# Patient Record
Sex: Female | Born: 1950 | Race: Black or African American | Hispanic: No | Marital: Married | Smoking: Former smoker
Health system: Southern US, Community
[De-identification: ages and names within clinical notes are randomized; demographics above are authoritative.]

## PROBLEM LIST (undated history)

## (undated) DIAGNOSIS — M8580 Other specified disorders of bone density and structure, unspecified site: Secondary | ICD-10-CM

## (undated) DIAGNOSIS — T4145XA Adverse effect of unspecified anesthetic, initial encounter: Secondary | ICD-10-CM

## (undated) DIAGNOSIS — Z8709 Personal history of other diseases of the respiratory system: Secondary | ICD-10-CM

## (undated) DIAGNOSIS — J189 Pneumonia, unspecified organism: Secondary | ICD-10-CM

## (undated) DIAGNOSIS — R351 Nocturia: Secondary | ICD-10-CM

## (undated) DIAGNOSIS — C801 Malignant (primary) neoplasm, unspecified: Secondary | ICD-10-CM

## (undated) DIAGNOSIS — M549 Dorsalgia, unspecified: Secondary | ICD-10-CM

## (undated) DIAGNOSIS — Z9289 Personal history of other medical treatment: Secondary | ICD-10-CM

## (undated) DIAGNOSIS — I10 Essential (primary) hypertension: Secondary | ICD-10-CM

## (undated) DIAGNOSIS — R350 Frequency of micturition: Secondary | ICD-10-CM

## (undated) DIAGNOSIS — G4730 Sleep apnea, unspecified: Secondary | ICD-10-CM

## (undated) DIAGNOSIS — G8929 Other chronic pain: Secondary | ICD-10-CM

## (undated) DIAGNOSIS — E559 Vitamin D deficiency, unspecified: Secondary | ICD-10-CM

## (undated) DIAGNOSIS — M1990 Unspecified osteoarthritis, unspecified site: Secondary | ICD-10-CM

## (undated) DIAGNOSIS — C50919 Malignant neoplasm of unspecified site of unspecified female breast: Secondary | ICD-10-CM

## (undated) DIAGNOSIS — K219 Gastro-esophageal reflux disease without esophagitis: Secondary | ICD-10-CM

## (undated) DIAGNOSIS — R3915 Urgency of urination: Secondary | ICD-10-CM

## (undated) DIAGNOSIS — M2550 Pain in unspecified joint: Secondary | ICD-10-CM

## (undated) DIAGNOSIS — K5900 Constipation, unspecified: Secondary | ICD-10-CM

## (undated) DIAGNOSIS — M2540 Effusion, unspecified joint: Secondary | ICD-10-CM

## (undated) DIAGNOSIS — G629 Polyneuropathy, unspecified: Secondary | ICD-10-CM

## (undated) DIAGNOSIS — Z8719 Personal history of other diseases of the digestive system: Secondary | ICD-10-CM

## (undated) DIAGNOSIS — Z8619 Personal history of other infectious and parasitic diseases: Secondary | ICD-10-CM

## (undated) DIAGNOSIS — T8859XA Other complications of anesthesia, initial encounter: Secondary | ICD-10-CM

## (undated) DIAGNOSIS — K5790 Diverticulosis of intestine, part unspecified, without perforation or abscess without bleeding: Secondary | ICD-10-CM

## (undated) HISTORY — DX: Other chronic pain: G89.29

## (undated) HISTORY — DX: Sleep apnea, unspecified: G47.30

## (undated) HISTORY — DX: Essential (primary) hypertension: I10

## (undated) HISTORY — DX: Malignant (primary) neoplasm, unspecified: C80.1

## (undated) HISTORY — PX: BACK SURGERY: SHX140

## (undated) HISTORY — DX: Other specified disorders of bone density and structure, unspecified site: M85.80

## (undated) HISTORY — DX: Polyneuropathy, unspecified: G62.9

## (undated) HISTORY — PX: BREAST SURGERY: SHX581

## (undated) HISTORY — PX: OTHER SURGICAL HISTORY: SHX169

## (undated) HISTORY — PX: DILATION AND CURETTAGE OF UTERUS: SHX78

## (undated) HISTORY — PX: ABDOMINAL HYSTERECTOMY: SHX81

## (undated) HISTORY — DX: Dorsalgia, unspecified: M54.9

## (undated) HISTORY — PX: ESOPHAGOGASTRODUODENOSCOPY: SHX1529

## (undated) HISTORY — PX: COLONOSCOPY: SHX174

## (undated) HISTORY — DX: Malignant neoplasm of unspecified site of unspecified female breast: C50.919

---

## 1997-06-02 ENCOUNTER — Other Ambulatory Visit: Admission: RE | Admit: 1997-06-02 | Discharge: 1997-06-02 | Payer: Self-pay | Admitting: Obstetrics and Gynecology

## 1997-07-14 ENCOUNTER — Inpatient Hospital Stay (HOSPITAL_COMMUNITY): Admission: RE | Admit: 1997-07-14 | Discharge: 1997-07-18 | Payer: Self-pay | Admitting: Obstetrics and Gynecology

## 1997-07-30 ENCOUNTER — Other Ambulatory Visit: Admission: RE | Admit: 1997-07-30 | Discharge: 1997-07-30 | Payer: Self-pay | Admitting: Obstetrics and Gynecology

## 1997-10-29 ENCOUNTER — Emergency Department (HOSPITAL_COMMUNITY): Admission: EM | Admit: 1997-10-29 | Discharge: 1997-10-29 | Payer: Self-pay | Admitting: Emergency Medicine

## 1998-02-15 ENCOUNTER — Ambulatory Visit (HOSPITAL_COMMUNITY): Admission: RE | Admit: 1998-02-15 | Discharge: 1998-02-15 | Payer: Self-pay | Admitting: Internal Medicine

## 1998-11-19 ENCOUNTER — Ambulatory Visit (HOSPITAL_COMMUNITY): Admission: RE | Admit: 1998-11-19 | Discharge: 1998-11-19 | Payer: Self-pay | Admitting: Internal Medicine

## 1999-01-06 ENCOUNTER — Other Ambulatory Visit: Admission: RE | Admit: 1999-01-06 | Discharge: 1999-01-06 | Payer: Self-pay | Admitting: Obstetrics and Gynecology

## 1999-02-15 ENCOUNTER — Encounter: Payer: Self-pay | Admitting: Internal Medicine

## 1999-02-15 ENCOUNTER — Encounter: Admission: RE | Admit: 1999-02-15 | Discharge: 1999-02-15 | Payer: Self-pay | Admitting: Internal Medicine

## 1999-04-05 ENCOUNTER — Encounter: Admission: RE | Admit: 1999-04-05 | Discharge: 1999-04-05 | Payer: Self-pay | Admitting: Internal Medicine

## 1999-04-05 ENCOUNTER — Encounter: Payer: Self-pay | Admitting: Internal Medicine

## 1999-07-14 ENCOUNTER — Ambulatory Visit (HOSPITAL_COMMUNITY): Admission: RE | Admit: 1999-07-14 | Discharge: 1999-07-14 | Payer: Self-pay | Admitting: Gastroenterology

## 1999-09-11 ENCOUNTER — Inpatient Hospital Stay (HOSPITAL_COMMUNITY): Admission: EM | Admit: 1999-09-11 | Discharge: 1999-09-13 | Payer: Self-pay | Admitting: Emergency Medicine

## 1999-09-11 ENCOUNTER — Encounter: Payer: Self-pay | Admitting: Internal Medicine

## 2000-07-02 ENCOUNTER — Other Ambulatory Visit: Admission: RE | Admit: 2000-07-02 | Discharge: 2000-07-02 | Payer: Self-pay | Admitting: Obstetrics and Gynecology

## 2000-08-20 ENCOUNTER — Ambulatory Visit (HOSPITAL_COMMUNITY): Admission: RE | Admit: 2000-08-20 | Discharge: 2000-08-20 | Payer: Self-pay | Admitting: Internal Medicine

## 2000-08-24 ENCOUNTER — Encounter: Payer: Self-pay | Admitting: Internal Medicine

## 2000-08-24 ENCOUNTER — Encounter: Admission: RE | Admit: 2000-08-24 | Discharge: 2000-08-24 | Payer: Self-pay | Admitting: Internal Medicine

## 2000-09-26 ENCOUNTER — Encounter: Payer: Self-pay | Admitting: Emergency Medicine

## 2000-09-26 ENCOUNTER — Emergency Department (HOSPITAL_COMMUNITY): Admission: EM | Admit: 2000-09-26 | Discharge: 2000-09-26 | Payer: Self-pay | Admitting: Emergency Medicine

## 2001-04-11 ENCOUNTER — Emergency Department (HOSPITAL_COMMUNITY): Admission: EM | Admit: 2001-04-11 | Discharge: 2001-04-11 | Payer: Self-pay

## 2003-10-08 ENCOUNTER — Inpatient Hospital Stay (HOSPITAL_COMMUNITY): Admission: EM | Admit: 2003-10-08 | Discharge: 2003-10-11 | Payer: Self-pay | Admitting: Emergency Medicine

## 2003-10-09 ENCOUNTER — Encounter: Payer: Self-pay | Admitting: Cardiology

## 2003-11-10 ENCOUNTER — Ambulatory Visit (HOSPITAL_BASED_OUTPATIENT_CLINIC_OR_DEPARTMENT_OTHER): Admission: RE | Admit: 2003-11-10 | Discharge: 2003-11-10 | Payer: Self-pay | Admitting: Internal Medicine

## 2003-11-23 ENCOUNTER — Ambulatory Visit (HOSPITAL_COMMUNITY): Admission: RE | Admit: 2003-11-23 | Discharge: 2003-11-23 | Payer: Self-pay | Admitting: Internal Medicine

## 2004-02-05 ENCOUNTER — Encounter: Admission: RE | Admit: 2004-02-05 | Discharge: 2004-02-05 | Payer: Self-pay | Admitting: Internal Medicine

## 2004-02-08 ENCOUNTER — Encounter: Admission: RE | Admit: 2004-02-08 | Discharge: 2004-02-08 | Payer: Self-pay | Admitting: Internal Medicine

## 2004-04-04 ENCOUNTER — Ambulatory Visit (HOSPITAL_COMMUNITY): Admission: RE | Admit: 2004-04-04 | Discharge: 2004-04-04 | Payer: Self-pay | Admitting: Gastroenterology

## 2004-07-27 ENCOUNTER — Inpatient Hospital Stay (HOSPITAL_COMMUNITY): Admission: RE | Admit: 2004-07-27 | Discharge: 2004-07-30 | Payer: Self-pay | Admitting: Neurosurgery

## 2004-10-25 ENCOUNTER — Ambulatory Visit (HOSPITAL_COMMUNITY): Admission: RE | Admit: 2004-10-25 | Discharge: 2004-10-25 | Payer: Self-pay | Admitting: Neurosurgery

## 2004-10-27 ENCOUNTER — Encounter: Admission: RE | Admit: 2004-10-27 | Discharge: 2004-10-27 | Payer: Self-pay | Admitting: Internal Medicine

## 2004-11-02 ENCOUNTER — Encounter: Admission: RE | Admit: 2004-11-02 | Discharge: 2004-11-02 | Payer: Self-pay | Admitting: Internal Medicine

## 2005-01-23 ENCOUNTER — Other Ambulatory Visit: Admission: RE | Admit: 2005-01-23 | Discharge: 2005-01-23 | Payer: Self-pay | Admitting: Gynecology

## 2005-02-13 ENCOUNTER — Emergency Department (HOSPITAL_COMMUNITY): Admission: EM | Admit: 2005-02-13 | Discharge: 2005-02-14 | Payer: Self-pay | Admitting: Emergency Medicine

## 2005-08-30 ENCOUNTER — Encounter: Payer: Self-pay | Admitting: Internal Medicine

## 2005-12-31 ENCOUNTER — Emergency Department (HOSPITAL_COMMUNITY): Admission: EM | Admit: 2005-12-31 | Discharge: 2005-12-31 | Payer: Self-pay

## 2006-04-19 ENCOUNTER — Other Ambulatory Visit: Admission: RE | Admit: 2006-04-19 | Discharge: 2006-04-19 | Payer: Self-pay | Admitting: Gynecology

## 2006-05-28 ENCOUNTER — Encounter: Admission: RE | Admit: 2006-05-28 | Discharge: 2006-05-28 | Payer: Self-pay | Admitting: Internal Medicine

## 2006-06-04 ENCOUNTER — Encounter: Admission: RE | Admit: 2006-06-04 | Discharge: 2006-06-04 | Payer: Self-pay | Admitting: Internal Medicine

## 2006-12-24 ENCOUNTER — Encounter: Admission: RE | Admit: 2006-12-24 | Discharge: 2006-12-24 | Payer: Self-pay | Admitting: General Surgery

## 2006-12-27 ENCOUNTER — Encounter (HOSPITAL_BASED_OUTPATIENT_CLINIC_OR_DEPARTMENT_OTHER): Payer: Self-pay | Admitting: General Surgery

## 2006-12-27 ENCOUNTER — Ambulatory Visit (HOSPITAL_BASED_OUTPATIENT_CLINIC_OR_DEPARTMENT_OTHER): Admission: RE | Admit: 2006-12-27 | Discharge: 2006-12-27 | Payer: Self-pay | Admitting: General Surgery

## 2006-12-27 HISTORY — PX: OTHER SURGICAL HISTORY: SHX169

## 2006-12-28 ENCOUNTER — Emergency Department (HOSPITAL_COMMUNITY): Admission: EM | Admit: 2006-12-28 | Discharge: 2006-12-28 | Payer: Self-pay | Admitting: Emergency Medicine

## 2007-01-29 ENCOUNTER — Ambulatory Visit (HOSPITAL_BASED_OUTPATIENT_CLINIC_OR_DEPARTMENT_OTHER): Admission: RE | Admit: 2007-01-29 | Discharge: 2007-01-30 | Payer: Self-pay | Admitting: General Surgery

## 2007-01-29 ENCOUNTER — Encounter (HOSPITAL_BASED_OUTPATIENT_CLINIC_OR_DEPARTMENT_OTHER): Payer: Self-pay | Admitting: General Surgery

## 2007-03-23 ENCOUNTER — Emergency Department (HOSPITAL_COMMUNITY): Admission: EM | Admit: 2007-03-23 | Discharge: 2007-03-23 | Payer: Self-pay | Admitting: Emergency Medicine

## 2007-04-04 ENCOUNTER — Ambulatory Visit: Payer: Self-pay | Admitting: Oncology

## 2007-04-09 ENCOUNTER — Ambulatory Visit: Admission: RE | Admit: 2007-04-09 | Discharge: 2007-05-01 | Payer: Self-pay | Admitting: Radiation Oncology

## 2007-05-02 ENCOUNTER — Ambulatory Visit: Admission: RE | Admit: 2007-05-02 | Discharge: 2007-07-05 | Payer: Self-pay | Admitting: Radiation Oncology

## 2007-05-02 DIAGNOSIS — C801 Malignant (primary) neoplasm, unspecified: Secondary | ICD-10-CM

## 2007-05-02 HISTORY — DX: Malignant (primary) neoplasm, unspecified: C80.1

## 2007-05-06 LAB — COMPREHENSIVE METABOLIC PANEL
ALT: 16 U/L (ref 0–35)
AST: 14 U/L (ref 0–37)
Albumin: 4.4 g/dL (ref 3.5–5.2)
Alkaline Phosphatase: 80 U/L (ref 39–117)
BUN: 7 mg/dL (ref 6–23)
Creatinine, Ser: 0.89 mg/dL (ref 0.40–1.20)
Potassium: 3.4 mEq/L — ABNORMAL LOW (ref 3.5–5.3)

## 2007-05-06 LAB — CBC WITH DIFFERENTIAL/PLATELET
BASO%: 0.4 % (ref 0.0–2.0)
Basophils Absolute: 0 10*3/uL (ref 0.0–0.1)
EOS%: 4 % (ref 0.0–7.0)
HGB: 13.8 g/dL (ref 11.6–15.9)
MCH: 28.1 pg (ref 26.0–34.0)
MCHC: 34 g/dL (ref 32.0–36.0)
MCV: 82.8 fL (ref 81.0–101.0)
MONO%: 5.3 % (ref 0.0–13.0)
RDW: 12.7 % (ref 11.3–14.5)

## 2007-05-29 ENCOUNTER — Other Ambulatory Visit: Admission: RE | Admit: 2007-05-29 | Discharge: 2007-05-29 | Payer: Self-pay | Admitting: Gynecology

## 2007-06-17 ENCOUNTER — Ambulatory Visit (HOSPITAL_COMMUNITY): Admission: RE | Admit: 2007-06-17 | Discharge: 2007-06-17 | Payer: Self-pay | Admitting: Gastroenterology

## 2008-09-07 ENCOUNTER — Ambulatory Visit: Payer: Self-pay | Admitting: Gynecology

## 2008-09-07 ENCOUNTER — Other Ambulatory Visit: Admission: RE | Admit: 2008-09-07 | Discharge: 2008-09-07 | Payer: Self-pay | Admitting: Gynecology

## 2008-09-07 ENCOUNTER — Encounter: Payer: Self-pay | Admitting: Gynecology

## 2008-09-07 ENCOUNTER — Encounter: Admission: RE | Admit: 2008-09-07 | Discharge: 2008-09-07 | Payer: Self-pay | Admitting: General Surgery

## 2008-09-17 ENCOUNTER — Ambulatory Visit: Payer: Self-pay | Admitting: Gynecology

## 2008-09-29 ENCOUNTER — Ambulatory Visit: Payer: Self-pay | Admitting: Oncology

## 2009-09-22 ENCOUNTER — Ambulatory Visit: Payer: Self-pay | Admitting: Gynecology

## 2009-09-22 ENCOUNTER — Other Ambulatory Visit: Admission: RE | Admit: 2009-09-22 | Discharge: 2009-09-22 | Payer: Self-pay | Admitting: Gynecology

## 2009-10-28 ENCOUNTER — Encounter: Admission: RE | Admit: 2009-10-28 | Discharge: 2009-10-28 | Payer: Self-pay | Admitting: Family Medicine

## 2009-11-25 ENCOUNTER — Ambulatory Visit: Payer: Self-pay | Admitting: Oncology

## 2009-12-23 ENCOUNTER — Ambulatory Visit: Payer: Self-pay | Admitting: Gynecology

## 2010-05-21 ENCOUNTER — Encounter: Payer: Self-pay | Admitting: Internal Medicine

## 2010-05-22 ENCOUNTER — Encounter: Payer: Self-pay | Admitting: Internal Medicine

## 2010-09-13 NOTE — Op Note (Signed)
Tara Summers, SIEGMANN               ACCOUNT NO.:  [id number]   MEDICAL RECORD NO.:  [id number]          PATIENT TYPE:  AMB   LOCATION:  DSC                          FACILITY:  [facility]   PHYSICIAN:  Leonie Man, M.D.   DATE OF BIRTH:  August 22, 1950   DATE OF PROCEDURE:  12/27/2006  DATE OF DISCHARGE:                               OPERATIVE REPORT   PREOPERATIVE DIAGNOSIS:  Right breast lesion, rule out carcinoma.   POSTOPERATIVE DIAGNOSIS:  Right breast lesion, rule out carcinoma,  pathology pending.   PROCEDURE:  Needle-localized excisional biopsy of right breast lesion.   SURGEON:  Leonie Man, M.D.   ASSISTANT:  OR nurse.   ANESTHESIA:  General.   The patient is a 60 year old female with a right-sided breast lesion  which on separate core biopsies has shown papillomas.  It most recently  showed an atypical papilloma read as BI-RADS 4.  She comes to the  operating room now for removal of this lesion after the risks and  potential benefits of surgery have been discussed, all questions  answered, and consent obtained.   PROCEDURE:  The patient is positioned supinely, and following the  induction of satisfactory anesthesia, the breast is prepped and draped  to be included in a sterile operative field.  Site localization, patient  identity, and the operation to be performed is substantiated and a right-  sided incision made approximately two fingerbreadths above the  inframammary fold.  It is deepened through the skin and subcutaneous  tissues, raising a superior flap and an inferior flap and dissecting  dura down following the wire into the region of the mass.  Large wide  margins around the mass were taken, and the entire mass was removed and  forwarded for radiologic diagnosis.  I spoke with Dr. Cassandria Anger from  [facility]  who says both clips and the lesion have been  successfully removed.  The lesion was then forwarded for pathologic  evaluation.  Hemostasis  was obtained within the substance of the breast.  The breast tissues were reapproximated using interrupted 2-0 Vicryl.  The skin was closed using a 5-0 Monocryl suture and reinforced with  Steri-Strips.  Sterile dressings were applied.   The anesthetic was reversed and the patient removed from the operating  room to the recovery room in stable condition.  She tolerated the  procedure well.      Leonie Man, M.D.  Electronically Signed     PB/MEDQ  D:  12/27/2006  T:  12/28/2006  Job:  [id number]

## 2010-09-13 NOTE — Op Note (Signed)
NAMESONDI, DESCH               ACCOUNT NO.:  [id number]   MEDICAL RECORD NO.:  [id number]          PATIENT TYPE:  AMB   LOCATION:  DSC                          FACILITY:  [facility]   PHYSICIAN:  Leonie Man, M.D.   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   PROCEDURES:  Re-excision right breast lumpectomy site for margins.   SURGEON:  Dr. Leonie Man.   ASSISTANT:  OR nurse.   ANESTHESIA:  General.   SPECIMENS TO LAB:  Breast tissue with a suture marking the superior  margin.   ESTIMATED BLOOD LOSS:  Minimal.   COMPLICATIONS:  None.  The patient returned to the PACU in excellent  condition.   Ms. Kelli Robeck is a 60 year old lady who underwent a wide localized  excision of a left breast ductal carcinoma in situ.  The estimated size  of the tumor at the time was 1.3 cm. Distance to the closest margin was  than 0.1 cm. The patient returns to the operating room now for re-  excision of the site after the risks and potential benefits of surgery  had been discussed, all questions answered and consent obtained.   DESCRIPTION OF PROCEDURE:  The patient is positioned supinely and  following the induction of satisfactory general anesthesia, the right  breast is identified, prepped and draped to be included in a sterile  operative field.  Patient identification and side of operation to be the  right side was then confirmed. I then made an elliptical incision around  the old scar cicatrix deepening this through the skin and subcutaneous  tissues and extending flaps medially, superiorly, laterally and  inferiorly so as to re-excise the entire cavity of the  previous excision  site.  This was accomplished at the area of the superior margin and this  was marked with a silk suture.  The entire breast excision site was then  removed and forwarded for pathologic evaluation.  Hemostasis was assured  with electrocautery.  Sponge and instrument counts verified. I elevated  the breast tissue from off of the pectoralis muscle so as to effect  tension-free closure of the breast tissue without leaving any  significant dead space within the breast.  This was done with 2-0 Vicryl  sutures.  The subcutaneous tissues were then closed with 3-0 Vicryl  sutures and the skin was closed  with a running 5-0 Monocryl suture and then reinforced with Steri-  Strips.  Sterile dressings were applied.  The anesthetic reversed and  the patient removed from the operating room to the recovery room in  stable condition.  She tolerated the procedure well.      Leonie Man, M.D.  Electronically Signed     PB/MEDQ  D:  01/29/2007  T:  01/29/2007  Job:  [id number]

## 2010-09-16 NOTE — Op Note (Signed)
NAMEKAZI, MONTORO               ACCOUNT NO.:  [id number]   MEDICAL RECORD NO.:  [id number]          PATIENT TYPE:  INP   LOCATION:  3013                         FACILITY:  [facility]   PHYSICIAN:  Coletta Memos, M.D.     DATE OF BIRTH:  06/03/1950   DATE OF PROCEDURE:  07/27/2004  DATE OF DISCHARGE:                                 OPERATIVE REPORT   PREOPERATIVE DIAGNOSES:  1.  L4 spondylolisthesis.  2.  L4-5 spondylolisthesis  3.  Grade I L4-5 degenerative disk disease.  4.  L4 lumbar radiculopathy.   POSTOPERATIVE DIAGNOSES:  1.  L4 spondylolisthesis.  2.  L4-5 spondylolisthesis  3.  Grade I L4-5 degenerative disk disease.  4.  L4 lumbar radiculopathy.   PROCEDURES:  1.  An L4 Gill procedure.  2.  Posterolateral arthrodesis L4-L5  3.  Posterior lumbar interbody fusion L4-5 with 11 mm Synthes PEEK cages      packed with morselized autograft.  4.  Pedicle screw fixation L4-L5.   SURGEON:  Coletta Memos, M.D.   ASSISTANT:  Hewitt Shorts, M.D.   COMPLICATIONS:  None.   ANESTHESIA:  General endotracheal.   INDICATIONS:  Tara Summers is a 60 year old who has been a long-term  patient of mine. She has had history of severe pain in the back and lower  extremities, left greater than right. She came in with an MRI that showed  spondylolisthesis of L4 and a spondylolisthesis of L4 and L5. She also had  severe compression of both nerve roots in the neural foramen. I recommended  and she agreed to undergo operative decompression and fusion of the L4-L5  segment.   DESCRIPTION OF PROCEDURE:  Mrs. Beauchamp was taken to the operating room,  intubated and placed under general anesthetic without difficulty. She was  rolled prone onto body rolls and all pressure points were properly padded.  Prior to that she had had her urethra catheterized and a Foley was placed  under sterile conditions. Her back was prepped and she was draped sterilely.  I infiltrated 20 mL 0.5% lidocaine  1:200,000 epinephrine into my proposed  incision in the lumbar region. I opened the skin with a #10 blade and took  this down to the thoracolumbar fascia. I then expose the lamina of L4-L5 and  of L3.  I  had an x-ray performed and it showed that I had a double ended  ganglion knife inferior to the lamina of L4. I was able to clearly identify  that as the spondylolisthesis.  It was extraordinarily loose and scar tissue  was present. I removed that segment and saved it for bone graft. Now having  unroofed the spinal canal, I was then able to decompress both L4 nerve roots  laterally. This was done with the use of Kerrison punches.   I then performed diskectomies bilaterally using the tools provided in the  Synthes PLIF set and Epstein curettes. After doing a gross total diskectomy,  I then used a rasp, curettes and the disk space shavers from the Synthes  PLIF set. After preparing the interspace for a 11  mm PEEK cage, I placed the  PEEK cages which had been packed with morselized autograft. This was done  bilaterally without difficulty.   I then turned my attention to the posterolateral arthrodesis and placed  morselized autograft laterally.   I then turned my attention to the pedicle screw fixation.. All screws were  placed with fluoroscopic guidance. Two screws in L4, two screws in L5. Each  screw hole was first probed with a blunt probe then tapped with a 5.5 mm  tap. I then placed the screws without difficulty, two in L5, two in L4. X-  ray showed screws to be in good position. I then placed 30 mm rods into the  screw heads and locked those into position. Morselized autograft had been  placed within the disk space prior to placement of the cages. The bone was  decorticated laterally. I then closed the wound in layered fashion with  Vicryl sutures with Dr. Earl Gala assistance. I reapproximated subcutaneous  tissue and subcuticular layer. Dermabond was used for sterile dressing. The   patient tolerated procedure well.      KC/MEDQ  D:  07/27/2004  T:  07/27/2004  Job:  [id number]

## 2010-09-16 NOTE — Discharge Summary (Signed)
[name], Tara Summers               ACCOUNT NO.:  [id number]   MEDICAL RECORD NO.:  [id number]          PATIENT TYPE:  INP   LOCATION:  3013                         FACILITY:  [facility]   PHYSICIAN:  Coletta Memos, M.D.     DATE OF BIRTH:  June 11, 1950   DATE OF ADMISSION:  07/27/2004  DATE OF DISCHARGE:  07/30/2004                                 DISCHARGE SUMMARY   ADMITTING DIAGNOSES:  L4-L5 spondylolisthesis, L4 spondylolysis, lumbar  radiculopathy and degenerative disk disease.   POSTOPERATIVE DIAGNOSES:  L4-L5 spondylolisthesis, L4 spondylolysis, lumbar  radiculopathy and degenerative disk disease.   PROCEDURES:  L4 Gill, L4-L5 posterolateral arthrodesis, posterior lumbar  interbody fusion L4-L5 with 11 millimeter Synthes P cages, pedicle screw  fixation L4-L5.   COMPLICATIONS:  None.   DISCHARGE STATUS:  Alive and well.   DESTINATION:  Home.   MEDICATIONS:  1.  Percocet 1-2 p.o. p.r.n. q.6 hours pain, 7.5/325.  2.  Flexeril 10 mg p.o. t.i.d. p.r.n. spasms.   [name] is a 60 year old with a long history of severe pain in her back  and lower extremities.  She had severe compression of the L4 nerve roots in  the neuroforamen secondary to spondylolisthesis due to a pars defect at L4.  I recommended and she agreed to undergo operative decompression.  Postoperatively her hospital course was very uneventful.  She did quite  well, having undergone physical therapy.  She will be discharged home.  Wound is clean, dry and without signs of infection.  She is voiding without  difficulty.  Walking well.      KC/MEDQ  D:  07/29/2004  T:  07/30/2004  Job:  [id number]

## 2010-09-16 NOTE — H&P (Signed)
[place]. [facility]  Patient:    Tara Summers, Tara Summers                      MRN: [id number] Adm. Date:  [id number] Attending:  Gwenyth Bender                         History and Physical  CHIEF COMPLAINT:  Increasing chest pain with weakness.  HISTORY OF PRESENT ILLNESS:  The patient is a 60 year old married black female, who presented to the emergency room complaining of increasing left-sided chest pain with associated weakness.  She relates she has not been feeling well for the past week.  She has noted intermittent aching pain in the left side of the chest which was sharp at times as well.  She noted pain traveling down into her left arm and the left back region.  She has also noted progressive fatigue during this time.  There has been no shortness of breath or diaphoresis with these episodes.  The patient notes intermittent diastasis in her fingers.  Today, her symptoms became much more severe and she came to the emergency room for further evaluation.  There has been no nausea or vomiting.  Denies significant palpitations or lightheadedness otherwise.  The patient has a history of hypertension for which she has been on a calcium channel agent.  She has also had recurrent episodes of hypokalemia in the past.  Notably she denies having symptoms of hypokalemia at this time.  She denies significant diarrhea.  No diuretic use. No laxative use.  History is significant for longstanding hypertension with associated obesity. Recently underwent colonoscopy two months ago which demonstrated a mild diverticulosis.  Endoscopy was unremarkable.  REVIEW OF SYSTEMS:  As noted above.  She has had intermittent low-back pain as well secondary to degenerative disk disease of the lumbosacral spine.  The patient does not smoke nor drink.  PRESENT MEDICATIONS: 1. Norvasc 5 mg q.d. 2. K-Dur 20 mEq b.i.d. per patients report.  She notably occasionally    skips  medication.  PHYSICAL EXAMINATION:  GENERAL:  On physical examination she is a well-developed, weak-appearing female in no acute distress.  VITAL SIGNS:  Reveal a blood pressure of 146/78, pulse 88, respirations 20, temperature 97.4.  O2 saturation is 98% on room air.  HEENT:  Head normocephalic, atraumatic without bruits.  Extraocular muscles intact.  Fundi grade I.  No sinus tenderness.  Posterior pharynx is clear.  NECK:  Supple.  No enlarged thyroid.  No posterior cervical nodes.  LUNGS:  Clear with decreased breath sounds at bases.  CARDIOVASCULAR:  Examination shows normal S1, S2.  No S3, S4, murmurs, or rubs.  BREASTS:  Pendulous.  She has slight tenderness along the left pectoralis major muscles.  There is also tenderness along the biceps muscles bilaterally. There is tenderness in the lower extremities as well.  ABDOMEN:  Bowel sounds are present.  No epigastric tenderness or masses appreciated.  MUSCULOSKELETAL:  Examination is as noted above.  EXTREMITIES:  Notable for mild tenderness in the posterior calf and negative Homans.  Pulses are trace to 1+ bilaterally.  Strength is intact.  ECG demonstrates a normal sinus rhythm, normal axis with nonspecific ST-T wave changes.  LABORATORY DATA:  Remarkable for a potassium of 2.6, sodium 139, chloride 102, CO2 32.  BUN 16, creatinine 0.9, glucose 129.  She has an albumin of 3.8. SGOT, SGPT is normal.  Alkaline phosphatase  is 65.  CK notably 365 with 4.8% MB.  Relative index of 1.3.  Troponin I was less than 0.03.  Chest x-ray showed no active disease.  IMPRESSION: 1. Chest pain, presently musculoskeletal in origin.  Rule out myositis versus    rhabdomyolysis versus other. 2. Hypokalemia, symptomatic.  Questionable etiology at this time.  The patient    is not on any diuretics.  No history of gastrointestinal disease.  No    laxative use.  Rule out other. 3. Rule out atypical angina. 4. Hypertension. 5.  Obesity. 6. Degenerative disk disease, lumbosacral spine.  PLAN:  Will admit patient for a 23-hour observation.  Will correct her electrolytes with oral potassium as well as IV potassium.  Will obtain spot urine and potassium at this time.  Will need to document normal potassium.  We will at this time rule out underlying coronary artery disease as well.  Will obtain a serum Testerone level.  Further therapy pending results of the above. D:  09/11/99 TD:  09/12/99 Job: [id number]

## 2010-09-16 NOTE — Procedures (Signed)
[place]. [facility]  Patient:    Tara Summers, Tara Summers                      MRN: [id number] Proc. Date: 07/14/99 Adm. Date:  [id number] Attending:  Charna Elizabeth CC:         Lind Guest. August Saucer, M.D.                           Procedure Report  DATE OF BIRTH:  June 14, 1960  PROCEDURE PERFORMED:  Esophagogastroduodenoscopy.  ENDOSCOPIST:  Anselmo Rod, M.D.  REFERRING PHYSICIAN:  Eric L. August Saucer, M.D.  INSTRUMENTS USED:  Olympus video panendoscope.  INDICATION FOR PROCEDURE:  Epigastric pain with severe reflux and guaiac-positive stools in a 60 year old black female.  Rule out peptic ulcer disease, esophagitis, gastritis, etc.  PREPROCEDURE PREPARATION:  Informed consent was procured from the patient.  The  patient was fasted for eight hours prior to the procedure.  PREPROCEDURE PHYSICAL EXAMINATION:  VITAL SIGNS:  The patient had stable vital signs.  NECK:  Supple.  CHEST:  Clear to auscultation, S1, S2 regular.  ABDOMEN:  Soft with epigastric tenderness on palpation with guarding.  No rebound or wretching.  No hepatosplenomegaly.  No masses palpable.  DESCRIPTION OF THE PROCEDURE:  The patient was placed in the left lateral decubitus position, and sedated with Demerol and Versed.  Once the patient was adequately  sedated and maintained on low-flow oxygen, and continuous cardiac monitoring, the Olympus video panendoscope was advanced through the mouthpiece, over the tongue, into the esophagus under direct vision.  The entire esophagus appeared normal without evidence of ring, stricture, masses, lesions, or esophagitis and the gastric mucosa appeared healthy without evidence of hernia, ulcers, masses, or polyps.   The duodenal bulb and the small bowel distal to the bulb appeared normal. There was no obvious obstruction.  The patient tolerated the procedure well without complications.  IMPRESSION:  Normal  esophagogastroduodenoscopy.  RECOMMENDATIONS: 1. Weight loss within 30+ ______ have been emphasized. 2. The patient has strongly been advised to refrain from use of all    nonsteroidals. 3. Brochures of antireflux measures have been given to the patient for    education. 4. Outpatient follow up is advised in the next two weeks. 5. Proceed with colonoscopy at this time. DD:  07/14/99 TD:  07/15/99 Job: [id number]

## 2010-09-16 NOTE — Discharge Summary (Signed)
NAME:  Tara Summers, Tara Summers                         ACCOUNT NO.:  [id number]   MEDICAL RECORD NO.:  [id number]                   PATIENT TYPE:  INP   LOCATION:  0345                                 [facility]ACILITY:  [facility]   PHYSICIAN:  Mohan N. Sharyn Lull, M.D.              DATE OF BIRTH:  22-May-1950   DATE OF ADMISSION:  10/08/2003  DATE OF DISCHARGE:  10/11/2003                                 DISCHARGE SUMMARY   ADMITTING DIAGNOSES:  1. Chest pain, rule out myocardial infarction.  2. Uncontrolled hypertension.  3. Morbid obesity.  4. History of hiatus hernia.  5. Hypokalemia/headache, rule out secondary causes of hypertension (i.e.     primary hyperaldosteronism).  6. Positive family history of coronary artery disease.  7. Remote history of tobacco abuse.  8. Elevated blood sugars, rule out diabetes mellitus.   FINAL DIAGNOSES:  1. Status post chest pain, myocardial infarction ruled out.  Negative     Persantine Cardiolite.  2. Hypertension.  3. Morbid obesity.  4. New onset diabetes mellitus controlled by diet.  5. Hiatus hernia.  6. Persistent hypokalemia.  7. Negative CT of abdomen for abdominal tumors.  8. Positive family history of coronary artery disease.  9. Remote history of tobacco abuse.   DISCHARGE MEDICATIONS:  1. Aldactone 50 mg one tablet twice daily.  2. Toprol-XL 50 mg one tablet daily.  3. Altace 10 mg one capsule daily.  4. Baby aspirin 81 mg one tablet daily.   ACTIVITY:  As tolerated.   DIET:  Low salt, low cholesterol, 1800 calorie ADA diet.  The patient has  been advised to watch sweets and lose weight.   FOLLOW UP:  BNP in 2 weeks.  Follow up with Dr. Willey Blade in 2 weeks.   CONDITION ON DISCHARGE:  Stable.   BRIEF HISTORY AND HOSPITAL COURSE:  [name] is a 60 year old black female  with a past medical history significant for hypertension, hiatus hernia,  remote history of tobacco abuse, morbid obesity, history of plantar  fasciitis/heel spurs,  positive family history of coronary artery disease.  She came to the emergency room complaining of back and chest pain radiating  to the neck and left arm, squeezing in nature, associated with  lightheadedness, bilateral __________  .  The patient's chest pain initially  lasted __________  in the chest after lunch, and then around 2 p.m. while  dusting felt severe pain, so desired to come to the emergency room.  The  patient received aspirin, morphine, sulfate, and Vicodin in the ER with  relief of chest pain.  The patient denies any symptoms of chest pain in the  past.  Denies any orthopnea, leg swelling.  Denies palpations,  lightheadedness, and syncope.  The patient gives a history of exertional  dyspnea and occasional chest discomfort with exertion.  Presently denies any  chest pain.   PAST MEDICAL HISTORY:  As above.  PAST SURGICAL HISTORY:  1. She had right breast cyst resection many years ago.  2. Had a hysterectomy many years ago for fibroid tumors.   ALLERGIES:  No known drug allergies.   MEDICATIONS AT HOME:  1. She was on Norvasc 7.5 mg per patient, one tablet daily.  2. K-Dur 20 mEq daily.  The patient says she has been on K-Dur for many     years, and her potassium stays low all the time.  3. Lipitor 20 mg one tablet daily.   Denies any vomiting, diarrhea, or any laxative abuse.   SOCIAL HISTORY:  She is married and has 4 children.  Smoked a half pack per  day for 7 years; quit in 1979.  No history of alcohol abuse.  Does  housekeeping work.   FAMILY HISTORY:  Father died of emphysema at the age of 37.  Mother died of  MI at the age of 71.   PHYSICAL EXAMINATION:  GENERAL:  Alert and oriented x3.  In no acute  distress.  VITAL SIGNS:  Blood pressure was 189/98, pulse of 102, sinus tachycardia on  the monitor.  __________  .  NECK:  Supple.  No JVD, no bruit.  LUNGS:  She has decreased breath sounds at the bases.  CARDIOVASCULAR:  S1 and S2 were normal.  There  was no S3 gallop.  There was  a soft S4 gallop.  ABDOMEN:  Soft.  Bowel sounds are present.  Nontender.  EXTREMITIES:  There is no clubbing, cyanosis, or edema.  Bilateral pulses  were equal.  There was no evidence of DVT.   LABORATORY DATA:  Three sets of cardiac enzymes point of care read negative.  Her TSH was 2.63.  Aldosterone level is still pending.  __________  level is  still pending.  Her cholesterol is 215, LDL of 145.  Two sets of cardiac  enzymes were negative.  Hemoglobin A1C was 5.9.  Sodium was 141, potassium  2.8, chloride 103, bicarb 29.  Blood sugar was 123.  The peak fasting blood  sugar was 126.  Repeat electrolytes revealed a sodium of 141, potassium 3.4,  chloride 105, bicarb 28.  Magnesium was normal at 1.7.  Hemoglobin was 13.4,  hematocrit 38.7, white count of 7.7.  Renin level is pending.  CT of the  abdomen preliminary report revealed no evidence of adrenal adenoma.  EKG  showed normal sinus rhythm, LVH, left atrial enlargement, poor R wave  progression in V1 through V5.  Persantine Cardiolite was negative for  ischemia, with normal ejection fraction.   BRIEF HOSPITAL COURSE:  The patient was admitted to the telemetry unit.  Myocardial infarction was ruled out by serial enzymes and EKG.  The patient  subsequently underwent a Persantine Cardiolite on October 09, 2003, which  showed no evidence of ischemia.  The patient had persistent hypokalemia,  despite replacing adequate doses of K-Dur.  The patient denied any diuretic  abuse.  Denied any diarrhea or vomiting.  The patient subsequently underwent  a CT of the abdomen yesterday which showed no evidence of adrenal adenoma.  The patient has been ambulating in the hallway without any problems.  The  patient will be discharged home on the above medications, and will be  followed by Dr. Willey Blade in 2 weeks.  [name]. Sharyn Lull, M.D.   MNH/MEDQ  D:  10/11/2003  T:   10/11/2003  Job:  [id number]   cc:   Minerva Areola L. August Saucer, M.D.  [address] [address]  [place]  [place] [address]  Fax: [phone]

## 2010-09-16 NOTE — Discharge Summary (Signed)
. [facility]  Patient:    Tara, SOBOTTA                      MRN: [id number] Adm. Date:  [id number] Disc. Date: [id number] Attending:  Gwenyth Bender                           Discharge Summary  FINAL DIAGNOSES: 1. Chest pain, noncardiac. 2. Severe hypokalemia. 3. Hypertension. 4. Morbid obesity. 5. Osteoarthrosis. 6. Rhabdomyolysis secondary to hypokalemia.  OPERATIONS AND PROCEDURES:  None.  HISTORY OF PRESENT ILLNESS:  This was the first recent hospital admission for this 60 year old, married, black female who presented to the emergency room complaining of increasing left-sided chest pain with associated weakness. The patient relates not feeling well for the past week.  She had noted an intermittent aching pain on the left side of her chest which was sharp at times as well.  She noted the pain traveling down into her left leg and left pain recently.  The patient had noted increasing fatigue during this time as well.  There was no associated nausea or vomiting, or anginal type sensations. She denies significant palpitations or light-headedness as well.  The patient had been on a calcium channel agent.  She had, notably had, a past history of recurrent hypokalemia episodes in the past which was symptomatic.  At the time of admission, the patient was noted to have a potassium of 2.6.  She was admitted thereafter for further therapy.  PAST MEDICAL HISTORY:  Per admission H&P.  PHYSICAL EXAMINATION:  Per admission H&P.  HOSPITAL COURSE:  The patient was admitted for further treatment of chest pain which was most consistent with musculoskeletal in origin.  This was highly suggestive of a rhabdomyolysis associated with her severe hypokalemia. Her CPKs were elevated with negative MBs.  The patient was placed on telemetry in lieu of her significant hypokalemia.  She was given runs of 10 mEq IV potassium for a total of three times.  She was given  oral potassium replacement as well.  Her cardiac enzymes were evaluated as noted.  She was not found to have evidence for myocardial disease.  Notably, eventually her potassium  was corrected.  With this, her symptoms resolved completely.  A serum aldosterone level was obtained at the time of admission.  She was placed on spironolactone as well in lieu of her repeated bouts of hypokalemia.  The patient was made ambulatory thereafter which she tolerated.  By Sep 13, 1999, she was stable for discharge.  MEDICATIONS AT TIME OF DISCHARGE: 1. Spironolactone 25 mg p.o. b.i.d. 2. K-Dur 20 mEq q.i.d. 3. Norvasc 5 mg q.d. 4. Prevacid 30 mg p.o. q.h.s.  FOLLOW-UP:  The patient will be seen in the office in follow-up in three days time.  We need to repeat electrolyte study at that time as well. DD:  11/16/99 TD:  11/20/99 Job: [id number]

## 2010-09-16 NOTE — Procedures (Signed)
[place]  Patient:    Tara Summers, Tara Summers                      MRN: [id number] Proc. Date: 07/14/99 Adm. Date:  [id number] Disc. Date: [id number] Attending:  Charna Elizabeth CC:         Lind Guest. August Saucer, M.D.                           Procedure Report  DATE OF BIRTH:  December 01, 1950  PROCEDURE PERFORMED:  Colonoscopy.  ENDOSCOPIST:  Anselmo Rod, M.D.  INSTRUMENT USED:  Olympus video colonoscope.  INDICATION FOR PROCEDURE:  Guaiac-positive stools in a 60 year old black female. Rule out masses, polyps, erosions, ulcerations, diverticulosis, etc.  PREPROCEDURE PREPARATION:  Informed consent was procured from the patient.  The  patient was fasted for eight hours prior to procedure and prepped with a bottle of magnesium citrate and a gallon of NuLytely the night prior to the procedure.  PREPROCEDURE PHYSICAL:  VITAL SIGNS:  The patient had stable vital signs.  NECK:  Supple.  CHEST:  Clear to auscultation.  S1, S2 regular.  ABDOMEN:  Obese with epigastric tenderness on palpation with guarding.  No rebound, no rigiditym no hepatosplenomegaly.  No masses palpable.  DESCRIPTION OF THE PROCEDURE:  The patient was placed in the left lateral decubitus position and sedated with Demerol and Versed for the EGD.  No additional sedation was given for the colonoscopy.  Once the patient was adequately positioned, the  Olympus video colonoscope was advanced from the rectum to the cecum without difficulty.  There were right-sided diverticular lesions seen in the early stages of formation.  No masses, polyps, erosions, or ulcerations were present.  The patient tolerated the procedure well without complication.  IMPRESSION: 1. Few early ____-sided diverticular pockets. 2. Otherwise normal colonoscopy.  No masses or polyps seen.  RECOMMENDATIONS: 1. The patient has been advised to follow up in the office for repeat guaiac    testing.  Further  recommendations will be made at that time. 2. Information on diverticular disease and diverticulitis has been given to the    patient for her education.  A high-fiber diet has been encouraged. 3. Outpatient follow-up advised in the new two weeks. DD:  07/14/99 TD:  07/15/99 Job: [id number]

## 2010-09-16 NOTE — Procedures (Signed)
NAME:  Tara Summers, Tara Summers             ACCOUNT NO.:  [id number]   MEDICAL RECORD NO.:  [id number]          PATIENT TYPE:  OUT   LOCATION:  SLEEP CENTER                 FACILITY:  [facility]   PHYSICIAN:  Clinton D. Maple Hudson, M.D. DATE OF BIRTH:  01-Dec-1950   DATE OF ADMISSION:  11/10/2003  DATE OF DISCHARGE:  11/10/2003                              NOCTURNAL POLYSOMNOGRAM   REFERRING PHYSICIAN:  Dr. Willey Blade   INDICATION FOR STUDY AND HISTORY:  Hypersomnia with sleep apnea,  hypertension.   HOME MEDICATIONS:  None listed.  No medications taken before sleep.   Epworth sleepiness score 5/24, neck size 15.5 inches, BMI 47, weight 240  pounds.   SLEEP ARCHITECTURE:  301 minutes of sleep were recorded with a sleep  efficiency of 83%.  Total sleep time was 7% for stage I; 38% for stage II;  16% for stages III and IV; 39% REM.  Latency to sleep onset 28 minutes,  latency to REM 39 minutes.   RESPIRATORY DATA:  Mild obstructive sleep apnea/hypopnea with an RDI of 13.4  obstructive event per hour including 65 obstructive hypopnea and 2  obstructive apneas.  Events were not positional .  REM RDI was 8.8 per hour.  Split study protocol could not be utilized because of late sleep onset and  insufficient early events.   OXYGEN DATA:  Snoring was moderate with oxygen desaturation to 87% during  apneas.  Otherwise mean oxygen saturation through the study was 97% on room  air.   CARDIAC DATA:  Normal sinus rhythm with sinus bradycardia, heart rate  ranging 49 to 85 per minutes.   MOVEMENT AND PARASOMNIA:  There were occasional leg jerks with insignificant  effect on sleep.   IMPRESSION AND RECOMMENDATIONS:  Mild obstructive sleep apnea/hypopnea  syndrome (RDI 13 per hour) and mild oxygen desaturation.  Consider return  for CPAP titration if clinically appropriate.  It may help for patient to  bring a sleep medication on return.                                   ______________________________                              Rennis Chris. Maple Hudson, M.D.                                Diplomate, American Board of Sleep Medicine    CDY/MEDQ  D:  11/15/2003 16:39:30  T:  11/16/2003 14:55:06  Job:  [id number]

## 2010-09-23 ENCOUNTER — Ambulatory Visit (INDEPENDENT_AMBULATORY_CARE_PROVIDER_SITE_OTHER): Payer: BC Managed Care – PPO | Admitting: Women's Health

## 2010-09-23 DIAGNOSIS — R350 Frequency of micturition: Secondary | ICD-10-CM

## 2010-09-23 DIAGNOSIS — R809 Proteinuria, unspecified: Secondary | ICD-10-CM

## 2010-09-23 DIAGNOSIS — B373 Candidiasis of vulva and vagina: Secondary | ICD-10-CM

## 2010-09-23 DIAGNOSIS — N898 Other specified noninflammatory disorders of vagina: Secondary | ICD-10-CM

## 2010-10-17 ENCOUNTER — Ambulatory Visit: Payer: BC Managed Care – PPO | Admitting: Radiation Oncology

## 2010-12-08 ENCOUNTER — Other Ambulatory Visit: Payer: Self-pay | Admitting: Internal Medicine

## 2011-01-23 DIAGNOSIS — E119 Type 2 diabetes mellitus without complications: Secondary | ICD-10-CM | POA: Insufficient documentation

## 2011-01-23 DIAGNOSIS — M8580 Other specified disorders of bone density and structure, unspecified site: Secondary | ICD-10-CM | POA: Insufficient documentation

## 2011-01-23 DIAGNOSIS — I10 Essential (primary) hypertension: Secondary | ICD-10-CM | POA: Insufficient documentation

## 2011-01-27 ENCOUNTER — Encounter: Payer: BC Managed Care – PPO | Admitting: Gynecology

## 2011-01-30 ENCOUNTER — Ambulatory Visit: Payer: BC Managed Care – PPO | Attending: Radiation Oncology | Admitting: Radiation Oncology

## 2011-02-09 LAB — BASIC METABOLIC PANEL
BUN: 15
Chloride: 104
Glucose, Bld: 175 — ABNORMAL HIGH
Potassium: 3.5

## 2011-02-09 LAB — TROPONIN I: Troponin I: 0.02

## 2011-02-09 LAB — CK TOTAL AND CKMB (NOT AT ARMC)
CK, MB: 2.7
Relative Index: 2.1

## 2011-02-10 LAB — BASIC METABOLIC PANEL
Calcium: 9.1
GFR calc Af Amer: >60
GFR calc non Af Amer: >60
Sodium: 140

## 2011-02-10 LAB — DIFFERENTIAL
Basophils Absolute: 0
Lymphocytes Relative: 48 — ABNORMAL HIGH
Monocytes Absolute: 0.4
Monocytes Relative: 6
Neutro Abs: 2.9

## 2011-02-10 LAB — CBC
Hemoglobin: 13.3
RBC: 4.67

## 2011-03-27 ENCOUNTER — Encounter: Payer: Self-pay | Admitting: *Deleted

## 2011-04-02 ENCOUNTER — Emergency Department (HOSPITAL_COMMUNITY)
Admission: EM | Admit: 2011-04-02 | Discharge: 2011-04-02 | Disposition: A | Discharge status: Home / Self Care | Payer: BC Managed Care – PPO | Attending: Emergency Medicine | Admitting: Emergency Medicine

## 2011-04-02 ENCOUNTER — Encounter (HOSPITAL_COMMUNITY): Payer: Self-pay | Admitting: Emergency Medicine

## 2011-04-02 DIAGNOSIS — E119 Type 2 diabetes mellitus without complications: Secondary | ICD-10-CM | POA: Insufficient documentation

## 2011-04-02 DIAGNOSIS — I10 Essential (primary) hypertension: Secondary | ICD-10-CM | POA: Insufficient documentation

## 2011-04-02 DIAGNOSIS — M79609 Pain in unspecified limb: Secondary | ICD-10-CM | POA: Insufficient documentation

## 2011-04-02 DIAGNOSIS — Z853 Personal history of malignant neoplasm of breast: Secondary | ICD-10-CM | POA: Insufficient documentation

## 2011-04-02 DIAGNOSIS — M549 Dorsalgia, unspecified: Secondary | ICD-10-CM | POA: Insufficient documentation

## 2011-04-02 DIAGNOSIS — S61219A Laceration without foreign body of unspecified finger without damage to nail, initial encounter: Secondary | ICD-10-CM

## 2011-04-02 DIAGNOSIS — G8929 Other chronic pain: Secondary | ICD-10-CM | POA: Insufficient documentation

## 2011-04-02 DIAGNOSIS — S61209A Unspecified open wound of unspecified finger without damage to nail, initial encounter: Secondary | ICD-10-CM | POA: Insufficient documentation

## 2011-04-02 DIAGNOSIS — W260XXA Contact with knife, initial encounter: Secondary | ICD-10-CM | POA: Insufficient documentation

## 2011-04-02 DIAGNOSIS — Z79899 Other long term (current) drug therapy: Secondary | ICD-10-CM | POA: Insufficient documentation

## 2011-04-02 MED ORDER — TETANUS-DIPHTH-ACELL PERTUSSIS 5-2.5-18.5 LF-MCG/0.5 IM SUSP
0.5000 mL | Freq: Once | INTRAMUSCULAR | Status: AC
  Administered 2011-04-02: 0.5 mL via INTRAMUSCULAR
  Filled 2011-04-02: qty 0.5

## 2011-04-02 NOTE — ED Notes (Signed)
C/o approx 1 cm laceration to L thumb from a knife while chipping ice.  Bleeding controlled.

## 2011-04-02 NOTE — ED Provider Notes (Signed)
History     CSN: [id number] Arrival date & time: 04/02/2011  3:45 PM   First MD Initiated Contact with Patient 04/02/11 1759      Chief Complaint  Patient presents with  . Extremity Laceration    (Consider location/radiation/quality/duration/timing/severity/associated sxs/prior treatment) Patient is a 60 y.o. female presenting with skin laceration. The history is provided by the patient.  Laceration  The incident occurred 1 to 2 hours ago. The laceration is located on the left hand. The laceration is 2 cm in size. The laceration mechanism was a a clean knife. The pain is at a severity of 1/10. The pain is mild. The pain has been constant since onset. She reports no foreign bodies present. Her tetanus status is out of date.    Past Medical History  Diagnosis Date  . Hypertension   . Diabetes mellitus     TYPE 2  . Osteopenia   . 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 Hx of radiation therapy 04/30/07-06/21/07    right breast  . Peripheral neuropathy   . Chronic back pain     on disability  . History of mammogram 01/04/11    last done b/l mammogram 01/04/11  . Breast cancer     right breast lumpectomy    Past Surgical History  Procedure Date  . Cesarean section   . Back surgery   . Breast surgery 2008    MULTIPLE LUMPECTOMIES, BREAST CANCER 2008-RIGHT BR  . Abdominal hysterectomy     BSO/DR. HAYGOOD    Family History  Problem Relation Age of Onset  . Uterine cancer Maternal Aunt     questionable  hx    History  Substance Use Topics  . Smoking status: Former Games developer  . Smokeless tobacco: Not on file  . Alcohol Use: Yes     occasional     OB History    Grav Para Term Preterm Abortions TAB SAB Ect Mult Living   7 4   2 1    4       Review of Systems  All other systems reviewed and are negative.    Allergies  Review of patient's allergies indicates no known allergies.  Home Medications   Current Outpatient Rx  Name Route Sig Dispense Refill  .  NORVASC PO Oral Take 5 mg by mouth 2 (two) times daily.     Marland Kitchen CLONIDINE HCL 0.1 MG PO TABS Oral Take 0.1 mg by mouth 2 (two) times daily.      Marland Kitchen METFORMIN HCL 500 MG PO TABS Oral Take 500 mg by mouth 2 (two) times daily with a meal.      . BENICAR HCT PO Oral Take 1 tablet by mouth daily. Sample pack from doctors office      BP 183/89  Pulse 84  Temp(Src) 98.7 F (37.1 C) (Oral)  Resp 20  SpO2 96%  Physical Exam  Nursing note and vitals reviewed. Constitutional: She is oriented to person, place, and time. She appears well-developed and well-nourished. No distress.  HENT:  Head: Normocephalic and atraumatic.  Eyes: EOM are normal. Pupils are equal, round, and reactive to light.  Musculoskeletal:       Left hand: She exhibits tenderness and laceration. She exhibits normal range of motion. Normal strength noted. She exhibits no finger abduction, no thumb/finger opposition and no wrist extension trouble.       Hands: Neurological: She is alert and oriented to person, place, and time. She has normal strength.  A sensory deficit is present.    ED Course  Procedures (including critical care time)  Labs Reviewed - No data to display No results found.  LACERATION REPAIR Performed by: Gwyneth Sprout Authorized byGwyneth Sprout Consent: Verbal consent obtained. Risks and benefits: risks, benefits and alternatives were discussed Consent given by: patient Patient identity confirmed: provided demographic data Prepped and Draped in normal sterile fashion Wound explored  Laceration Location: Left thumb  Laceration Length: 2 cm  No Foreign Bodies seen or palpated  Anesthesia: local infiltration  Local anesthetic: lidocaine 1 % no epinephrine  Anesthetic total: 3 ml  Irrigation method: syringe Amount of cleaning: standard  Skin closure: 4.0 Prolene   Number of sutures: 2   Technique: Simple interrupted   Patient tolerance: Patient tolerated the procedure well with no  immediate complications.   No diagnosis found.    MDM   Pt with a laceration to the left thumb while she was showing a chip case with a knife. There is no tendon injury on exam. No nailbed involvement. Wound repaired. Tetanus updated and patient DC'd home.        Gwyneth Sprout, MD 04/02/11 [phone]

## 2011-04-02 NOTE — ED Notes (Signed)
Patient with laceration to left thumb

## 2011-04-03 ENCOUNTER — Encounter: Payer: Self-pay | Admitting: *Deleted

## 2011-04-04 ENCOUNTER — Telehealth: Payer: Self-pay | Admitting: *Deleted

## 2011-04-04 ENCOUNTER — Ambulatory Visit
Admission: RE | Admit: 2011-04-04 | Discharge: 2011-04-04 | Disposition: A | Discharge status: Home / Self Care | Payer: BC Managed Care – PPO | Source: Ambulatory Visit | Attending: Radiation Oncology | Admitting: Radiation Oncology

## 2011-04-04 ENCOUNTER — Encounter: Payer: Self-pay | Admitting: Radiation Oncology

## 2011-04-04 VITALS — BP 174/114 | HR 80 | Temp 97.8°F | Resp 20 | Wt 246.8 lb

## 2011-04-04 DIAGNOSIS — C50911 Malignant neoplasm of unspecified site of right female breast: Secondary | ICD-10-CM | POA: Clinically undetermined

## 2011-04-04 NOTE — Progress Notes (Signed)
Pt is out of sample b/p med  Restarted taking her norvasc,  Until next week to see her Md, tanning still, soreness in breast stated, no other c/o 11:36 AM

## 2011-04-04 NOTE — Telephone Encounter (Signed)
Called and left voice message for dr. Mathews Robinsons nurse answering machine, pt's visit today with Dr,. Kinard b/p =114/74, no c/o h./a, or pain, pt had run out of samples of her  b/p meds given last week at your office,pt restarted back on norvasc 5mg , but hadn't taken today as yet,pt was advised to take her b/p med when she got home,she also stated she would be at Dr. Mathews Robinsons office next week and would see about her b/p then along with cut finger, can call our office at [phone] if any questions 3:18 PM

## 2011-04-05 ENCOUNTER — Telehealth: Payer: Self-pay | Admitting: *Deleted

## 2011-04-05 NOTE — Progress Notes (Signed)
CC:   Tara Summers. Tara Summers, M.D.  DIAGNOSIS:  Intraductal carcinoma of the right breast.  INTERVAL SINCE RADIATION THERAPY:  3 years and 9 months  NARRATIVE:  Tara Summers comes in today for routine follow-up.  She continues to have some discomfort in the breast, but overall this seems to have improved over the past several months.  The patient denies any nipple discharge or bleeding.  The patient did undergo bilateral mammograms at the [facility] on January 04, 2011. This showed no suspicious changes in either breast.  There were lumpectomy scars noted in the right breast and some contraction consistent with prior radiation therapy.  As previously noted on dictations, the patient did not elect to proceed with medical oncology evaluation or hormonal therapy.  PHYSICAL EXAMINATION:  Vital Signs:  The patient's temperature is 97.8, pulse 80, blood pressure significant elevated and recommended the patient follow up with her primary care physician this afternoon concerning this elevated reading of 174/114.  The patient's weight is 246 pounds which is stable since her weighing in December of 2011. Examination of the neck and supraclavicular region reveals no evidence of adenopathy.  The axillary areas are free of adenopathy.  Examination of the lungs reveals them to be clear.  The heart has a regular rhythm and rate.  Breasts:  Examination of the left breast reveals it to be large and pendulous without mass or nipple discharge.  Examination of the right breast reveals it to be somewhat smaller than the left but also large and pendulous.  The patient's hyperpigmentation changes consistent with radiation effect.  There is some induration at the lumpectomy scar in the inferior aspect of the breast area but no dominant masses appreciated in the breast, nipple discharge or bleeding.  IMPRESSION AND PLAN:  Clinically no evidence of disease.  FOLLOW-UP:  The patient will return  for routine follow-up in 6 months. As above, the patient will follow up with her elevated blood pressure with her primary care physician, Dr. Andi Devon.    ______________________________ Billie Lade, Ph.D., M.D. JDK/MEDQ  D:  04/04/2011  T:  04/05/2011  Job:  [id number]

## 2011-04-05 NOTE — Telephone Encounter (Signed)
Yesterday,04/04/11,  in telephone documentation, pt's vitals were transposed eroneously, correct vitals were 174/114,pt had denied h/a, or pain, hadn't taken her Norvasc as yet,had advised to take when she got home, she was advuised to call her Dr. To let them know she was also out of her sample b/p meds, Md was changing her b/p meds, had called Dr.Shelton phone and left voice message with correct b/p on her nurses voicemail and if they had any questions,they could call this RN 2:00 PM

## 2011-04-06 NOTE — Telephone Encounter (Signed)
error 

## 2011-07-24 ENCOUNTER — Encounter (HOSPITAL_COMMUNITY): Payer: Self-pay | Admitting: *Deleted

## 2011-07-24 ENCOUNTER — Emergency Department (HOSPITAL_COMMUNITY)
Admission: EM | Admit: 2011-07-24 | Discharge: 2011-07-24 | Disposition: A | Discharge status: Home / Self Care | Payer: BC Managed Care – PPO | Attending: Emergency Medicine | Admitting: Emergency Medicine

## 2011-07-24 ENCOUNTER — Emergency Department (HOSPITAL_COMMUNITY): Payer: BC Managed Care – PPO

## 2011-07-24 DIAGNOSIS — M25569 Pain in unspecified knee: Secondary | ICD-10-CM | POA: Insufficient documentation

## 2011-07-24 DIAGNOSIS — I10 Essential (primary) hypertension: Secondary | ICD-10-CM | POA: Insufficient documentation

## 2011-07-24 DIAGNOSIS — E119 Type 2 diabetes mellitus without complications: Secondary | ICD-10-CM | POA: Insufficient documentation

## 2011-07-24 DIAGNOSIS — S8000XA Contusion of unspecified knee, initial encounter: Secondary | ICD-10-CM | POA: Insufficient documentation

## 2011-07-24 DIAGNOSIS — R296 Repeated falls: Secondary | ICD-10-CM | POA: Insufficient documentation

## 2011-07-24 DIAGNOSIS — S8002XA Contusion of left knee, initial encounter: Secondary | ICD-10-CM

## 2011-07-24 MED ORDER — HYDROCODONE-ACETAMINOPHEN 5-325 MG PO TABS
ORAL_TABLET | ORAL | Status: AC
  Administered 2011-07-24: 1 via ORAL
  Filled 2011-07-24: qty 1

## 2011-07-24 MED ORDER — HYDROCODONE-ACETAMINOPHEN 5-325 MG PO TABS
1.0000 | ORAL_TABLET | Freq: Once | ORAL | Status: AC
  Administered 2011-07-24: 1 via ORAL

## 2011-07-24 MED ORDER — HYDROCODONE-ACETAMINOPHEN 5-325 MG PO TABS: 1.0000 | ORAL_TABLET | ORAL | Status: AC | PRN

## 2011-07-24 NOTE — ED Provider Notes (Signed)
History     CSN: [id number]  Arrival date & time 07/24/11  0701   First MD Initiated Contact with Patient 07/24/11 [phone]      Chief Complaint  Patient presents with  . Fall  . Knee Pain    (Consider location/radiation/quality/duration/timing/severity/associated sxs/prior treatment) Patient is a 61 y.o. female presenting with fall. The history is provided by the patient.  Fall The accident occurred 6 to 12 hours ago. The fall occurred while walking. She landed on a hard floor. There was no blood loss. The point of impact was the left knee. The pain is present in the left knee. The pain is moderate. She was ambulatory at the scene. There was no entrapment after the fall. There was no drug use involved in the accident. There was no alcohol use involved in the accident. Pertinent negatives include no fever. Associated symptoms comments: She felt her left knee "gave way" last night as it has done in the past. Her weight came down on flexed knee and she reports no other injury.. The symptoms are aggravated by ambulation.    Past Medical History  Diagnosis Date  . Hypertension   . Diabetes mellitus     TYPE 2  . Osteopenia   . 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 Hx of radiation therapy 04/30/07-06/21/07    right breast  . Peripheral neuropathy   . Chronic back pain     on disability  . History of mammogram 01/04/11    last done b/l mammogram 01/04/11  . Breast cancer     right breast lumpectomy    Past Surgical History  Procedure Date  . Cesarean section   . Back surgery   . Breast surgery 2008    MULTIPLE LUMPECTOMIES, BREAST CANCER 2008-RIGHT BR  . Abdominal hysterectomy     BSO/DR. HAYGOOD    Family History  Problem Relation Age of Onset  . Uterine cancer Maternal Aunt     questionable  hx    History  Substance Use Topics  . Smoking status: Former Games developer  . Smokeless tobacco: Not on file  . Alcohol Use: Yes     occasional     OB History    Grav Para  Term Preterm Abortions TAB SAB Ect Mult Living   7 4   2 1    4      Obstetric Comments   5 daughters      Review of Systems  Constitutional: Negative for fever and chills.  HENT: Negative.   Respiratory: Negative.   Cardiovascular: Negative.   Gastrointestinal: Negative.   Musculoskeletal:       See HPI.  Skin: Negative.   Neurological: Negative.     Allergies  Review of patient's allergies indicates no known allergies.  Home Medications   Current Outpatient Rx  Name Route Sig Dispense Refill  . NORVASC PO Oral Take 5 mg by mouth 2 (two) times daily.     Marland Kitchen CLONIDINE HCL 0.1 MG PO TABS Oral Take 0.1 mg by mouth 2 (two) times daily.      Marland Kitchen METFORMIN HCL 500 MG PO TABS Oral Take 500 mg by mouth 2 (two) times daily with a meal.      . BENICAR HCT PO Oral Take 1 tablet by mouth daily. Sample pack from doctors office      BP 173/82  Pulse 83  Temp(Src) 97.7 F (36.5 C) (Oral)  Resp 18  SpO2 96%  Physical Exam  Constitutional:  She is oriented to person, place, and time. She appears well-developed and well-nourished.  Neck: Normal range of motion.  Pulmonary/Chest: Effort normal.  Musculoskeletal:       Left knee unremarkable in appearance. No swelling or discoloration. Calf without focal tenderness or swelling. All joints of left LE stable and with full range. Tender most to anterior left knee.   Neurological: She is alert and oriented to person, place, and time.  Skin: Skin is warm and dry.    ED Course  Procedures (including critical care time)  Labs Reviewed - No data to display No results found. Dg Knee Complete 4 Views Left  07/24/2011  *RADIOLOGY REPORT*  Clinical Data: Fall, knee pain  LEFT KNEE - COMPLETE 4+ VIEW  Comparison: Plain films 02/08/2004  Findings: No evidence of fracture or dislocation of the left knee. No joint effusion.  There is narrowing of the medial compartment with associated osteophytosis.  There is spurring of the patella.  IMPRESSION:  1.   No acute findings.  No fracture or effusion. 2.  Osteoarthritis most prominent in the medial compartment.  Original Report Authenticated By: Genevive Bi, M.D.    No diagnosis found.  1. Contusion - left knee  MDM          Rodena Medin, PA-C 07/24/11 0825

## 2011-07-24 NOTE — ED Notes (Signed)
Pt reports fall last night approx 10pm. Leg "gave out." Denies syncopal episode, denies hitting head. C/o L knee pain and swelling. Can ambulate on and bend knee. Has tried epson salt and elevation without relief.

## 2011-07-24 NOTE — ED Provider Notes (Signed)
Medical screening examination/treatment/procedure(s) were performed by non-physician practitioner and as supervising physician I was immediately available for consultation/collaboration.  Cheri Guppy, MD 07/24/11 [phone]

## 2011-07-24 NOTE — Discharge Instructions (Signed)
TAKE NORCO FOR PAIN AND WEAR KNEE IMMOBILIZER WHEN WALKING FOR STABILITY AND COMFORT. ICE AND ELEVATE TO REDUCE SWELLING. FOLLOW UP WITH DR. Victorino Dike FOR FURTHER EVALUATION IF PAIN IS NO BETTER IN 2-3 DAYS.  Cryotherapy Cryotherapy means treatment with cold. Ice or gel packs can be used to reduce both pain and swelling. Ice is the most helpful within the first 24 to 48 hours after an injury or flareup from overusing a muscle or joint. Sprains, strains, spasms, burning pain, shooting pain, and aches can all be eased with ice. Ice can also be used when recovering from surgery. Ice is effective, has very few side effects, and is safe for most people to use. PRECAUTIONS  Ice is not a safe treatment option for people with:  Raynaud's phenomenon. This is a condition affecting small blood vessels in the extremities. Exposure to cold may cause your problems to return.   Cold hypersensitivity. There are many forms of cold hypersensitivity, including:   Cold urticaria. Red, itchy hives appear on the skin when the tissues begin to warm after being iced.   Cold erythema. This is a red, itchy rash caused by exposure to cold.   Cold hemoglobinuria. Red blood cells break down when the tissues begin to warm after being iced. The hemoglobin that carry oxygen are passed into the urine because they cannot combine with blood proteins fast enough.   Numbness or altered sensitivity in the area being iced.  If you have any of the following conditions, do not use ice until you have discussed cryotherapy with your caregiver:  Heart conditions, such as arrhythmia, angina, or chronic heart disease.   High blood pressure.   Healing wounds or open skin in the area being iced.   Current infections.   Rheumatoid arthritis.   Poor circulation.   Diabetes.  Ice slows the blood flow in the region it is applied. This is beneficial when trying to stop inflamed tissues from spreading irritating chemicals to surrounding  tissues. However, if you expose your skin to cold temperatures for too long or without the proper protection, you can damage your skin or nerves. Watch for signs of skin damage due to cold. HOME CARE INSTRUCTIONS Follow these tips to use ice and cold packs safely.  Place a dry or damp towel between the ice and skin. A damp towel will cool the skin more quickly, so you may need to shorten the time that the ice is used.   For a more rapid response, add gentle compression to the ice.   Ice for no more than 10 to 20 minutes at a time. The bonier the area you are icing, the less time it will take to get the benefits of ice.   Check your skin after 5 minutes to make sure there are no signs of a poor response to cold or skin damage.   Rest 20 minutes or more in between uses.   Once your skin is numb, you can end your treatment. You can test numbness by very lightly touching your skin. The touch should be so light that you do not see the skin dimple from the pressure of your fingertip. When using ice, most people will feel these normal sensations in this order: cold, burning, aching, and numbness.   Do not use ice on someone who cannot communicate their responses to pain, such as small children or people with dementia.  HOW TO MAKE AN ICE PACK Ice packs are the most common way to  use ice therapy. Other methods include ice massage, ice baths, and cryo-sprays. Muscle creams that cause a cold, tingly feeling do not offer the same benefits that ice offers and should not be used as a substitute unless recommended by your caregiver. To make an ice pack, do one of the following:  Place crushed ice or a bag of frozen vegetables in a sealable plastic bag. Squeeze out the excess air. Place this bag inside another plastic bag. Slide the bag into a pillowcase or place a damp towel between your skin and the bag.   Mix 3 parts water with 1 part rubbing alcohol. Freeze the mixture in a sealable plastic bag. When you  remove the mixture from the freezer, it will be slushy. Squeeze out the excess air. Place this bag inside another plastic bag. Slide the bag into a pillowcase or place a damp towel between your skin and the bag.  SEEK MEDICAL CARE IF:  You develop white spots on your skin. This may give the skin a blotchy (mottled) appearance.   Your skin turns blue or pale.   Your skin becomes waxy or hard.   Your swelling gets worse.  MAKE SURE YOU:   Understand these instructions.   Will watch your condition.   Will get help right away if you are not doing well or get worse.  Document Released: 12/12/2010 Document Revised: 04/06/2011 Document Reviewed: 12/12/2010 [facility] Patient Information 2012 [place], [place].  Contusion A contusion is a deep bruise. Contusions happen when an injury causes bleeding under the skin. Signs of bruising include pain, puffiness (swelling), and discolored skin. The contusion may turn blue, purple, or yellow. HOME CARE   Put ice on the injured area.   Put ice in a plastic bag.   Place a towel between your skin and the bag.   Leave the ice on for 15 to 20 minutes, 3 to 4 times a day.   Only take medicine as told by your doctor.   Rest the injured area.   If possible, raise (elevate) the injured area to lessen puffiness.  GET HELP RIGHT AWAY IF:   You have more bruising or puffiness.   You have pain that is getting worse.   Your puffiness or pain is not helped by medicine.  MAKE SURE YOU:   Understand these instructions.   Will watch your condition.   Will get help right away if you are not doing well or get worse.  Document Released: 10/04/2007 Document Revised: 04/06/2011 Document Reviewed: 02/20/2011 [facility] Patient Information 2012 [place], [place].

## 2011-10-04 ENCOUNTER — Ambulatory Visit
Admission: RE | Admit: 2011-10-04 | Payer: BC Managed Care – PPO | Source: Ambulatory Visit | Admitting: Radiation Oncology

## 2011-10-31 ENCOUNTER — Telehealth: Payer: Self-pay | Admitting: *Deleted

## 2011-10-31 ENCOUNTER — Encounter: Payer: Self-pay | Admitting: Gynecology

## 2011-10-31 ENCOUNTER — Ambulatory Visit (INDEPENDENT_AMBULATORY_CARE_PROVIDER_SITE_OTHER): Payer: BC Managed Care – PPO | Admitting: Gynecology

## 2011-10-31 VITALS — BP 136/92 | Ht 60.0 in | Wt 244.0 lb

## 2011-10-31 DIAGNOSIS — R194 Change in bowel habit: Secondary | ICD-10-CM | POA: Insufficient documentation

## 2011-10-31 DIAGNOSIS — N951 Menopausal and female climacteric states: Secondary | ICD-10-CM

## 2011-10-31 DIAGNOSIS — R198 Other specified symptoms and signs involving the digestive system and abdomen: Secondary | ICD-10-CM

## 2011-10-31 DIAGNOSIS — Z01419 Encounter for gynecological examination (general) (routine) without abnormal findings: Secondary | ICD-10-CM

## 2011-10-31 NOTE — Telephone Encounter (Signed)
Message copied by Libby Maw on Tue Oct 31, 2011  2:52 PM ------      Message from: Ok Edwards      Created: Tue Oct 31, 2011  1:50 PM       Tara Summers, please contact us patient and tell her that I forgot to mention to her that she needs a bone density study. Her last bone was 3 years ago. I placed an order the computer. Thank you

## 2011-10-31 NOTE — Progress Notes (Signed)
Tara Summers 29-Sep-1950 [id number]   History:    61 y.o.  for annual gyn exam who was complaining of having to 3 bowel movements per day. Sometimes stool are semisolid and very loose. Patient denies any blood in her stools. Her last colonoscopy was 2005 which was normal. She has no family history of any GI malignancies reported. Her primary physician has been following her for her hypertension as well as for diabetes and is scheduled to see them next week for which labs will be drawn at that time. Patient also had been diagnosed with vitamin D deficiency in the past but her levels recently according to her are normal and she is on calcium and vitamin D supplementation. Patient has a history of a TVH with BSO in the past. Her mammogram was this year which was normal. Patient has a history of right breast cancer whereby she had a right lumpectomy and is currently being followed by the oncologist Dr. Roselind Messier. Patient's last bone density study was in 2010 and her lowest T score was at the [facility] with a value of -1.2.  Past medical history,surgical history, family history and social history were all reviewed and documented in the EPIC chart.  Gynecologic History No LMP recorded. Patient has had a hysterectomy. Contraception: Hysterectomy Last Pap: 2011. Results were: normal Last mammogram: 2013. Results were: normal  Obstetric History OB History    Grav Para Term Preterm Abortions TAB SAB Ect Mult Living   7 4 3 1 2 1    4      # Outc Date GA Lbr Len/2nd Wgt Sex Del Anes PTL Lv   1 GRA            2 TRM     F SVD  No Yes   3 TRM     F SVD  No Yes   4 PRE     F CS  Yes Yes   5 TRM     F CS  No Yes   6 TAB            7 ABT              Obstetric Comments   5 daughters       ROS: A ROS was performed and pertinent positives and negatives are included in the history.  GENERAL: No fevers or chills. HEENT: No change in vision, no earache, sore throat or sinus congestion. NECK: No pain or  stiffness. CARDIOVASCULAR: No chest pain or pressure. No palpitations. PULMONARY: No shortness of breath, cough or wheeze. GASTROINTESTINAL: No abdominal pain, nausea, vomiting or diarrhea, melena or bright red blood per rectum. GENITOURINARY: No urinary frequency, urgency, hesitancy or dysuria. MUSCULOSKELETAL: No joint or muscle pain, no back pain, no recent trauma. DERMATOLOGIC: No rash, no itching, no lesions. ENDOCRINE: No polyuria, polydipsia, no heat or cold intolerance. No recent change in weight. HEMATOLOGICAL: No anemia or easy bruising or bleeding. NEUROLOGIC: No headache, seizures, numbness, tingling or weakness. PSYCHIATRIC: No depression, no loss of interest in normal activity or change in sleep pattern.     Exam: chaperone present  BP 136/92  Ht 5' (1.524 m)  Wt 244 lb (110.678 kg)  BMI 47.65 kg/m2  Body mass index is 47.65 kg/(m^2).  General appearance : Well developed well nourished female. No acute distress HEENT: Neck supple, trachea midline, no carotid bruits, no thyroidmegaly Lungs: Clear to auscultation, no rhonchi or wheezes, or rib retractions  Heart: Regular rate and rhythm,  no murmurs or gallops Breast:Examined in sitting and supine position were symmetrical in appearance, no palpable masses or tenderness,  no skin retraction, no nipple inversion, no nipple discharge, no skin discoloration, no axillary or supraclavicular lymphadenopathy Abdomen: no palpable masses or tenderness, no rebound or guarding Extremities: no edema or skin discoloration or tenderness  Pelvic:  Bartholin, Urethra, Skene Glands: Within normal limits             Vagina: No gross lesions or discharge  Cervix: Absent Uterus absent  Adnexa  Without masses or tenderness  Anus and perineum  normal   Rectovaginal  normal sphincter tone without palpated masses or tenderness             Hemoccult cards provided     Assessment/Plan:  61 y.o. female for annual exam who will need to be followed by  the gastroenterologist as a result of her symptoms of loose soft stools and having 2-3 bowel movements a day. Her last colonoscopy was in 2005. Fecal occult blood testing cards provided for her to symmetrical the office for testing. She will followup with her primary physician for the additional blood work in with her oncologist as well for followup on her past history of breast cancer. Patient with prior hysterectomy and no history of abnormal Pap smears we will no longer need new Pap smears according to the new guidelines. She will be scheduling a bone density study that is overdue. She'll continue with her calcium and vitamin D for osteoporosis prevention. She was encouraged to continue her monthly self breast examinations.    Reynaldo Minium H MD, 1:40 PM 10/31/2011

## 2011-10-31 NOTE — Telephone Encounter (Signed)
Lm for patient to call

## 2011-11-01 ENCOUNTER — Encounter: Payer: Self-pay | Admitting: Radiation Oncology

## 2011-11-01 ENCOUNTER — Ambulatory Visit
Admission: RE | Admit: 2011-11-01 | Discharge: 2011-11-01 | Disposition: A | Discharge status: Home / Self Care | Payer: BC Managed Care – PPO | Source: Ambulatory Visit | Attending: Radiation Oncology | Admitting: Radiation Oncology

## 2011-11-01 VITALS — BP 177/105 | HR 90 | Temp 98.2°F | Wt 242.3 lb

## 2011-11-01 DIAGNOSIS — C50911 Malignant neoplasm of unspecified site of right female breast: Secondary | ICD-10-CM

## 2011-11-01 NOTE — Progress Notes (Signed)
Patient here for routine follow up post completion of right breast radiation February 2009. Has some soreness over right breast, otherwise doing well.

## 2011-11-01 NOTE — Progress Notes (Signed)
  Radiation Oncology         [phone] ________________________________  Name: Tara Summers MRN: [id number]  Date: 11/01/2011  DOB: [birth year]  Follow-Up Visit Note  CC: Alva Garnet., MD  Pierce Crane, MD  Diagnosis:   Intraductal carcinoma of the right breast  Interval Since Last Radiation:   4 years and 5 months   Narrative:  The patient returns today for routine follow-up.  She clinically seems to be doing well at this time. The patient has minimal discomfort in the right breast area. She did undergo routine digital bilateral diagnostic mammography in September 2012 . The pt.  was felt to have benign changes.    She forgot to take her blood pressure medication earlier today and hence her blood pressure is elevated    she has had some problems with frequent stools and in light of this will undergo a colonoscopy.                    ALLERGIES:   has no known allergies.  Meds: Current Outpatient Prescriptions  Medication Sig Dispense Refill  . AmLODIPine Besylate (NORVASC PO) Take 5 mg by mouth 2 (two) times daily.       Marland Kitchen aspirin 81 MG chewable tablet Chew 81 mg by mouth daily.      . ferrous sulfate 325 (65 FE) MG tablet Take 325 mg by mouth 2 (two) times a week. Tues and Thursday only      . HYDROcodone-acetaminophen (VICODIN) 2.5-500 MG per tablet Take 1 tablet by mouth every 6 (six) hours as needed.      Marland Kitchen ibuprofen (ADVIL,MOTRIN) 200 MG tablet Take 400 mg by mouth every 6 (six) hours as needed. For pain      . metFORMIN (GLUCOPHAGE) 500 MG tablet Take 500 mg by mouth 2 (two) times daily with a meal.        . DISCONTD: cloNIDine (CATAPRES) 0.1 MG tablet Take 0.1 mg by mouth 2 (two) times daily.        Marland Kitchen DISCONTD: Olmesartan Medoxomil-HCTZ (BENICAR HCT PO) Take 1 tablet by mouth daily. Sample pack from doctors office        Physical Findings: The patient is in no acute distress. Patient is alert and oriented.  weight is 242 lb 4.8 oz (109.907 kg). Her temperature is  98.2 F (36.8 C). Her blood pressure is 177/105 and her pulse is 90. Marland Kitchen  No palpable cervical supraclavicular or axillary adenopathy. The lungs are clear to auscultation. The heart has regular rhythm and rate. Examination left breast reveals no mass or nipple discharge. Examination right breast reveals to be slightly smaller than the left. There is hyperpigmentation changes  within the inferior aspect of the breast. There is no dominant mass appreciated breast nipple discharge or bleeding.  Lab Findings: Lab Results  Component Value Date   WBC 6.6 05/06/2007   HGB 13.8 05/06/2007   HCT 40.5 05/06/2007   MCV 82.8 05/06/2007   PLT 264 05/06/2007    @LASTCHEM @  Radiographic Findings:  Mammography performed at the [facility].   Impression:  The patient is doing well without signs of recurrent breast cancer  Plan:  Routine followup in 6 months.  _____________________________________  -----------------------------------  Billie Lade, PhD, MD

## 2011-11-07 NOTE — Telephone Encounter (Signed)
Lm for patient to call

## 2011-11-14 NOTE — Telephone Encounter (Signed)
LM for pt to call to schedule a BD

## 2011-11-21 NOTE — Telephone Encounter (Signed)
Lm for patient to call to schedule Dexa.

## 2011-12-12 NOTE — Telephone Encounter (Signed)
Lupita Leash in appts to send out recall for patient to call the office and schedule Dexa beings she hasnt returned phone messages.

## 2012-03-31 ENCOUNTER — Encounter (HOSPITAL_COMMUNITY): Payer: Self-pay | Admitting: Emergency Medicine

## 2012-03-31 DIAGNOSIS — K5289 Other specified noninfective gastroenteritis and colitis: Secondary | ICD-10-CM | POA: Insufficient documentation

## 2012-03-31 DIAGNOSIS — E119 Type 2 diabetes mellitus without complications: Secondary | ICD-10-CM | POA: Insufficient documentation

## 2012-03-31 DIAGNOSIS — Z7982 Long term (current) use of aspirin: Secondary | ICD-10-CM | POA: Insufficient documentation

## 2012-03-31 DIAGNOSIS — G609 Hereditary and idiopathic neuropathy, unspecified: Secondary | ICD-10-CM | POA: Insufficient documentation

## 2012-03-31 DIAGNOSIS — R338 Other retention of urine: Secondary | ICD-10-CM | POA: Insufficient documentation

## 2012-03-31 DIAGNOSIS — R197 Diarrhea, unspecified: Secondary | ICD-10-CM | POA: Insufficient documentation

## 2012-03-31 DIAGNOSIS — M899 Disorder of bone, unspecified: Secondary | ICD-10-CM | POA: Insufficient documentation

## 2012-03-31 DIAGNOSIS — Z87891 Personal history of nicotine dependence: Secondary | ICD-10-CM | POA: Insufficient documentation

## 2012-03-31 DIAGNOSIS — I10 Essential (primary) hypertension: Secondary | ICD-10-CM | POA: Insufficient documentation

## 2012-03-31 DIAGNOSIS — E860 Dehydration: Secondary | ICD-10-CM | POA: Insufficient documentation

## 2012-03-31 DIAGNOSIS — Z853 Personal history of malignant neoplasm of breast: Secondary | ICD-10-CM | POA: Insufficient documentation

## 2012-03-31 DIAGNOSIS — M949 Disorder of cartilage, unspecified: Secondary | ICD-10-CM | POA: Insufficient documentation

## 2012-03-31 DIAGNOSIS — N390 Urinary tract infection, site not specified: Secondary | ICD-10-CM | POA: Insufficient documentation

## 2012-03-31 DIAGNOSIS — G8929 Other chronic pain: Secondary | ICD-10-CM | POA: Insufficient documentation

## 2012-03-31 LAB — CBC WITH DIFFERENTIAL/PLATELET
Basophils Absolute: 0 10*3/uL (ref 0.0–0.1)
HCT: 39.7 % (ref 36.0–46.0)
Lymphocytes Relative: 50 % — ABNORMAL HIGH (ref 12–46)
Monocytes Absolute: 0.5 10*3/uL (ref 0.1–1.0)
Neutro Abs: 2.4 10*3/uL (ref 1.7–7.7)
Neutrophils Relative %: 39 % — ABNORMAL LOW (ref 43–77)
Platelets: 252 10*3/uL (ref 150–400)
RDW: 12.8 % (ref 11.5–15.5)
WBC: 6 10*3/uL (ref 4.0–10.5)

## 2012-03-31 LAB — COMPREHENSIVE METABOLIC PANEL
ALT: 26 U/L (ref 0–35)
AST: 23 U/L (ref 0–37)
Albumin: 3.8 g/dL (ref 3.5–5.2)
Alkaline Phosphatase: 76 U/L (ref 39–117)
CO2: 26 mEq/L (ref 19–32)
Chloride: 104 mEq/L (ref 96–112)
GFR calc non Af Amer: 62 mL/min — ABNORMAL LOW (ref >90)
Potassium: 3.3 mEq/L — ABNORMAL LOW (ref 3.5–5.1)
Sodium: 141 mEq/L (ref 135–145)
Total Bilirubin: 0.6 mg/dL (ref 0.3–1.2)

## 2012-03-31 NOTE — ED Notes (Addendum)
Pt reports unable to urinate--reports usually wears depends, and noticed Friday, she was not voiding; reports on Saturday, having little urine at a time-with dark urine, and having pain with urination and in lower abd; pt also reports she has been having diarrhea for past 48 hours, and feels like her stomach is distended; pt also reports hx of needing gallbladder removed but has not had done

## 2012-04-01 ENCOUNTER — Emergency Department (HOSPITAL_COMMUNITY)
Admission: EM | Admit: 2012-04-01 | Discharge: 2012-04-01 | Disposition: A | Discharge status: Home / Self Care | Payer: BC Managed Care – PPO | Attending: Emergency Medicine | Admitting: Emergency Medicine

## 2012-04-01 DIAGNOSIS — E860 Dehydration: Secondary | ICD-10-CM

## 2012-04-01 DIAGNOSIS — K529 Noninfective gastroenteritis and colitis, unspecified: Secondary | ICD-10-CM

## 2012-04-01 LAB — URINALYSIS, ROUTINE W REFLEX MICROSCOPIC
Glucose, UA: NEGATIVE mg/dL
Leukocytes, UA: NEGATIVE
Nitrite: NEGATIVE
Protein, ur: 30 mg/dL — AB

## 2012-04-01 LAB — URINE MICROSCOPIC-ADD ON

## 2012-04-01 MED ORDER — METRONIDAZOLE 500 MG PO TABS
500.0000 mg | ORAL_TABLET | Freq: Once | ORAL | Status: AC
  Administered 2012-04-01: 500 mg via ORAL
  Filled 2012-04-01: qty 1

## 2012-04-01 MED ORDER — HYDROMORPHONE HCL PF 1 MG/ML IJ SOLN
1.0000 mg | Freq: Once | INTRAMUSCULAR | Status: AC
  Administered 2012-04-01: 1 mg via INTRAVENOUS
  Filled 2012-04-01: qty 1

## 2012-04-01 MED ORDER — SODIUM CHLORIDE 0.9 % IV SOLN
Freq: Once | INTRAVENOUS | Status: AC
  Administered 2012-04-01: 01:00:00 via INTRAVENOUS

## 2012-04-01 MED ORDER — ONDANSETRON 4 MG PO TBDP: 4.0000 mg | ORAL_TABLET | Freq: Three times a day (TID) | ORAL | Status: DC | PRN

## 2012-04-01 MED ORDER — METRONIDAZOLE 500 MG PO TABS: 500.0000 mg | ORAL_TABLET | Freq: Two times a day (BID) | ORAL | Status: DC

## 2012-04-01 MED ORDER — CIPROFLOXACIN IN D5W 400 MG/200ML IV SOLN
400.0000 mg | Freq: Once | INTRAVENOUS | Status: AC
Start: 2012-04-01 — End: 2012-04-01
  Administered 2012-04-01: 400 mg via INTRAVENOUS
  Filled 2012-04-01: qty 200

## 2012-04-01 MED ORDER — NAPROXEN 500 MG PO TABS: 500.0000 mg | ORAL_TABLET | Freq: Two times a day (BID) | ORAL | Status: DC

## 2012-04-01 MED ORDER — CIPROFLOXACIN HCL 500 MG PO TABS: 500.0000 mg | ORAL_TABLET | Freq: Two times a day (BID) | ORAL | Status: DC

## 2012-04-01 MED ORDER — OXYCODONE-ACETAMINOPHEN 5-325 MG PO TABS: 1.0000 | ORAL_TABLET | ORAL | Status: DC | PRN

## 2012-04-01 NOTE — ED Notes (Signed)
Pt resting at this time.  cipro infusing and pt awaiting dc to home.

## 2012-04-01 NOTE — ED Notes (Signed)
Pt dc to home via w/c.  Pt states understanding to dc instructions and will f/u with pcp as directed.

## 2012-04-01 NOTE — ED Provider Notes (Signed)
History     CSN: [id number]  Arrival date & time 03/31/12  2257   First MD Initiated Contact with Patient 04/01/12 0009      Chief Complaint  Patient presents with  . Urinary Retention  . Urinary Tract Infection    (Consider location/radiation/quality/duration/timing/severity/associated sxs/prior treatment) HPI Comments: 61 year old female with a history of hypertension and diabetes who presents with complaint of lower abdominal pain. She states that she has urinary incontinence at baseline and has had to use adult diapers for years. She noted that on Friday she had much less urine, she has had very little since that time, she cannot maintain a stream which is on the toilet because there is no urine in her bladder. She admits to having dark-colored urine in her adult diaper which is brownish in color per her report. She also describes having watery diarrhea which is "explosive" and has had multiple episodes today. She denies any nausea or vomiting and has no fevers chills cough shortness of breath chest pain back pain rashes swelling headache lightheadedness or blurred vision. She has not been on recent antibiotics, she has not recently traveled, she has not been exposed to any hospitalized patients. The symptoms have been persistent throughout the day, gradually worsening.  Patient is a 61 y.o. female presenting with urinary tract infection. The history is provided by the patient and the spouse.  Urinary Tract Infection    Past Medical History  Diagnosis Date  . Hypertension   . Diabetes mellitus     TYPE 2  . Osteopenia   . 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 Hx of radiation therapy 04/30/07-06/21/07    right breast /4680 cgy/26 fractions with Breast Boost for  total dose of 6080 cGy  . Peripheral neuropathy   . Chronic back pain     on disability  . History of mammogram 01/04/11    last done b/l mammogram 01/04/11  . Breast cancer     right breast lumpectomy    Past  Surgical History  Procedure Date  . Cesarean section   . Back surgery   . Breast surgery 2008    MULTIPLE LUMPECTOMIES, BREAST CANCER 2008-RIGHT BR  . Abdominal hysterectomy     BSO/DR. HAYGOOD  . Excision right breast 12/27/2006    Needle Localize Excision - ductal Carcinoma In-Situ, Er 100%, PR 59% Positive    Family History  Problem Relation Age of Onset  . Uterine cancer Maternal Aunt     questionable  hx  . Heart disease Paternal Grandfather     History  Substance Use Topics  . Smoking status: Former Smoker    Quit date: 10/31/1987  . Smokeless tobacco: Never Used  . Alcohol Use: No    OB History    Grav Para Term Preterm Abortions TAB SAB Ect Mult Living   7 4 3 1 2 1    4      Obstetric Comments   Menarche age 34, Parity, G7, P4, surgical Menopause, No HRT Use      Review of Systems  All other systems reviewed and are negative.    Allergies  Review of patient's allergies indicates no known allergies.  Home Medications   Current Outpatient Rx  Name  Route  Sig  Dispense  Refill  . NORVASC PO   Oral   Take 5 mg by mouth 2 (two) times daily.          . ASPIRIN 81 MG PO CHEW  Oral   Chew 81 mg by mouth daily.         Marland Kitchen METFORMIN HCL 500 MG PO TABS   Oral   Take 500 mg by mouth 2 (two) times daily with a meal.           . CIPROFLOXACIN HCL 500 MG PO TABS   Oral   Take 1 tablet (500 mg total) by mouth every 12 (twelve) hours.   20 tablet   0   . METRONIDAZOLE 500 MG PO TABS   Oral   Take 1 tablet (500 mg total) by mouth 2 (two) times daily.   20 tablet   0   . NAPROXEN 500 MG PO TABS   Oral   Take 1 tablet (500 mg total) by mouth 2 (two) times daily with a meal.   30 tablet   0   . ONDANSETRON 4 MG PO TBDP   Oral   Take 1 tablet (4 mg total) by mouth every 8 (eight) hours as needed for nausea.   10 tablet   0   . OXYCODONE-ACETAMINOPHEN 5-325 MG PO TABS   Oral   Take 1 tablet by mouth every 4 (four) hours as needed for  pain.   20 tablet   0     BP 161/94  Pulse 84  Temp 97.9 F (36.6 C) (Oral)  Resp 18  SpO2 98%  Physical Exam  Nursing note and vitals reviewed. Constitutional: She appears well-developed and well-nourished. No distress.  HENT:  Head: Normocephalic and atraumatic.  Mouth/Throat: Oropharynx is clear and moist. No oropharyngeal exudate.  Eyes: Conjunctivae normal and EOM are normal. Pupils are equal, round, and reactive to light. Right eye exhibits no discharge. Left eye exhibits no discharge. No scleral icterus.  Neck: Normal range of motion. Neck supple. No JVD present. No thyromegaly present.  Cardiovascular: Normal rate, regular rhythm, normal heart sounds and intact distal pulses.  Exam reveals no gallop and no friction rub.   No murmur heard. Pulmonary/Chest: Effort normal and breath sounds normal. No respiratory distress. She has no wheezes. She has no rales.  Abdominal: Soft. Bowel sounds are normal. She exhibits no distension and no mass. There is tenderness ( Mild tenderness to the left upper and left lower quadrant, suprapubic area. No pain to the right lower quadrant or right upper quadrant, no guarding, decreased bowel sounds, no peritoneal signs).  Musculoskeletal: Normal range of motion. She exhibits no edema and no tenderness.  Lymphadenopathy:    She has no cervical adenopathy.  Neurological: She is alert. Coordination normal.  Skin: Skin is warm and dry. No rash noted. No erythema.  Psychiatric: She has a normal mood and affect. Her behavior is normal.    ED Course  Procedures (including critical care time)  Labs Reviewed  URINALYSIS, ROUTINE W REFLEX MICROSCOPIC - Abnormal; Notable for the following:    APPearance CLOUDY (*)     Specific Gravity, Urine 1.033 (*)     Bilirubin Urine SMALL (*)     Ketones, ur 15 (*)     Protein, ur 30 (*)     All other components within normal limits  CBC WITH DIFFERENTIAL - Abnormal; Notable for the following:     Neutrophils Relative 39 (*)     Lymphocytes Relative 50 (*)     All other components within normal limits  COMPREHENSIVE METABOLIC PANEL - Abnormal; Notable for the following:    Potassium 3.3 (*)     Glucose, Bld  152 (*)     GFR calc non Af Amer 62 (*)     GFR calc Af Amer 72 (*)     All other components within normal limits  CK - Abnormal; Notable for the following:    Total CK 333 (*)     All other components within normal limits  URINE MICROSCOPIC-ADD ON - Abnormal; Notable for the following:    Crystals CA OXALATE CRYSTALS (*)     All other components within normal limits   No results found.   1. Colitis   2. Dehydration       MDM  We'll obtain in and out catheterization for urine sample for a clean sample, measure bladder volume on catheterization, check CK to rule out rhabdomyolysis in her subjectively dehydrated state. Chemistry shows a normal renal function and a normal BUN, blood counts are normal, vital signs show mild hypertension.  Only 100 mL of urine in the bladder, specific gravity of 1.033 and ketonuria present, no signs of infection, mild hypokalemia but no anemia, leukocytosis or renal dysfunction. CK pending, IV rehydration initiated with IV fluid bolus.   Dilaudid given for pain, laboratory results reviewed with the patient, Cipro and Flagyl started for likely colitis given the patient's diarrhea with abdominal pain and a colonic pattern. No vomiting or nausea, patient tolerating oral fluids without difficulty, stable for discharge with normal vital signs including no tachycardia and no fever. She does have mild hypertension, this can be rechecked at her doctor's office.   Vida Roller, MD 04/01/12 [phone]

## 2012-04-01 NOTE — ED Notes (Signed)
In/out cath done.  Pt had 100 cc total output with cath.  Pt tolerated but c/o pain with procedure.

## 2012-04-12 ENCOUNTER — Ambulatory Visit: Admit: 2012-04-12 | Payer: Self-pay | Admitting: Oral Surgery

## 2012-04-12 SURGERY — MULTIPLE EXTRACTION WITH ALVEOLOPLASTY
Anesthesia: General

## 2012-04-22 ENCOUNTER — Other Ambulatory Visit (HOSPITAL_COMMUNITY): Payer: Self-pay | Admitting: Oral Surgery

## 2012-05-03 ENCOUNTER — Encounter: Payer: Self-pay | Admitting: Radiation Oncology

## 2012-05-06 ENCOUNTER — Ambulatory Visit
Admission: RE | Admit: 2012-05-06 | Payer: BC Managed Care – PPO | Source: Ambulatory Visit | Admitting: Radiation Oncology

## 2012-05-06 ENCOUNTER — Ambulatory Visit: Payer: BC Managed Care – PPO | Admitting: Radiation Oncology

## 2012-05-17 ENCOUNTER — Encounter (HOSPITAL_BASED_OUTPATIENT_CLINIC_OR_DEPARTMENT_OTHER): Payer: Self-pay

## 2012-05-17 ENCOUNTER — Ambulatory Visit (HOSPITAL_BASED_OUTPATIENT_CLINIC_OR_DEPARTMENT_OTHER): Admit: 2012-05-17 | Payer: BC Managed Care – PPO | Admitting: Oral Surgery

## 2012-05-17 SURGERY — MULTIPLE EXTRACTION WITH ALVEOLOPLASTY
Anesthesia: General | Site: Mouth | Laterality: Bilateral

## 2012-05-30 ENCOUNTER — Ambulatory Visit
Admission: RE | Admit: 2012-05-30 | Discharge: 2012-05-30 | Disposition: A | Discharge status: Home / Self Care | Payer: BC Managed Care – PPO | Source: Ambulatory Visit | Attending: Radiation Oncology | Admitting: Radiation Oncology

## 2012-05-30 ENCOUNTER — Encounter: Payer: Self-pay | Admitting: Radiation Oncology

## 2012-05-30 VITALS — Resp 18 | Wt 234.1 lb

## 2012-05-30 DIAGNOSIS — C50911 Malignant neoplasm of unspecified site of right female breast: Secondary | ICD-10-CM

## 2012-05-30 NOTE — Progress Notes (Signed)
Patient presents to the clinic today unaccompanied for a follow up appointment with Dr. Roselind Messier. Patient alert and oriented to person, place, and time. No distress noted. Steady gait noted. Pleasant affect noted. Patient denies pain at this time. However, patient does reports that right breast discomfort does continue. Patient denies palpating lumps or bumps in either breast. Patient denies nipple discharge. Patient denies fatigue since she stays busy with her grandchildren. Blood pressure elevated as patient "forgot to take medication last night and this morning." Reported all findings to Dr. Roselind Messier.

## 2012-05-30 NOTE — Progress Notes (Signed)
Radiation Oncology         [phone] ________________________________  Name: Tara Summers MRN: [id number]  Date: 05/30/2012  DOB: May 27, 1950  Follow-Up Visit Note  CC: Alva Garnet., MD  Pierce Crane, MD  Diagnosis:   Intraductal carcinoma the right breast  Interval Since Last Radiation:  5 years   Narrative:  The patient returns today for routine follow-up.  She seems to be doing well. She occasionally will have some discomfort in the right breast but nothing on consistent basis. She denies any nipple discharge or bleeding. Patient did undergo her yearly mammogram on 02/13/2012 showing no suspicious areas in either breast. Patient not to take her blood pressure medication last night and this morning and hence her blood pressure is elevated today.                              ALLERGIES:   has no known allergies.  Meds: Current Outpatient Prescriptions  Medication Sig Dispense Refill  . ACCU-CHEK ACTIVE STRIPS test strip       . AmLODIPine Besylate (NORVASC PO) Take 5 mg by mouth 2 (two) times daily.       Marland Kitchen aspirin 81 MG chewable tablet Chew 81 mg by mouth daily.      . metFORMIN (GLUCOPHAGE) 500 MG tablet Take 500 mg by mouth 2 (two) times daily with a meal.        . naproxen (NAPROSYN) 500 MG tablet Take 1 tablet (500 mg total) by mouth 2 (two) times daily with a meal.  30 tablet  0  . oxyCODONE-acetaminophen (PERCOCET) 5-325 MG per tablet Take 1 tablet by mouth every 4 (four) hours as needed for pain.  20 tablet  0  . ciprofloxacin (CIPRO) 500 MG tablet Take 1 tablet (500 mg total) by mouth every 12 (twelve) hours.  20 tablet  0  . metroNIDAZOLE (FLAGYL) 500 MG tablet Take 1 tablet (500 mg total) by mouth 2 (two) times daily.  20 tablet  0  . ondansetron (ZOFRAN ODT) 4 MG disintegrating tablet Take 1 tablet (4 mg total) by mouth every 8 (eight) hours as needed for nausea.  10 tablet  0  . [DISCONTINUED] cloNIDine (CATAPRES) 0.1 MG tablet Take 0.1 mg by mouth 2 (two)  times daily.        . [DISCONTINUED] Olmesartan Medoxomil-HCTZ (BENICAR HCT PO) Take 1 tablet by mouth daily. Sample pack from doctors office        Physical Findings: The patient is in no acute distress. Patient is alert and oriented.  weight is 234 lb 1.6 oz (106.187 kg). Her respiration is 18. Marland Kitchen  No palpable supraclavicular or axillary adenopathy. The lungs are clear to auscultation. The heart has a regular rhythm and rate. Examination of the left breast reveals it to be pendulous without mass or nipple discharge. Examination of the right breast reveals it to be somewhat smaller than the left. There is some scar tissue noted in the inferior aspect of the breast near her lumpectomy scar. There is no dominant mass appreciated in the breast,  nipple discharge or bleeding. There are some hyperpigmentation changes noted.  Lab Findings: Lab Results  Component Value Date   WBC 6.0 03/31/2012   HGB 13.9 03/31/2012   HCT 39.7 03/31/2012   MCV 81.9 03/31/2012   PLT 252 03/31/2012    @LASTCHEM @  Radiographic Findings: Mammography performed at [facility]   Impression:  No evidence of  recurrence on clinical exam today.  Plan:  The patient will be followed on a when necessary basis and will continue yearly breast exams with her gynecologist and primary care physician.  She will also continue with yearly mammograms.  She does perform monthly self breast examinations.  _____________________________________  -----------------------------------  Billie Lade, PhD, MD

## 2012-05-31 ENCOUNTER — Encounter (HOSPITAL_COMMUNITY): Payer: Self-pay

## 2012-08-12 ENCOUNTER — Encounter: Payer: Self-pay | Admitting: Gynecology

## 2012-08-12 ENCOUNTER — Ambulatory Visit (INDEPENDENT_AMBULATORY_CARE_PROVIDER_SITE_OTHER): Payer: BC Managed Care – PPO | Admitting: Gynecology

## 2012-08-12 ENCOUNTER — Telehealth: Payer: Self-pay | Admitting: *Deleted

## 2012-08-12 VITALS — BP 144/92

## 2012-08-12 DIAGNOSIS — T8140XA Infection following a procedure, unspecified, initial encounter: Secondary | ICD-10-CM

## 2012-08-12 DIAGNOSIS — T8149XA Infection following a procedure, other surgical site, initial encounter: Secondary | ICD-10-CM | POA: Insufficient documentation

## 2012-08-12 MED ORDER — HYDROCODONE-ACETAMINOPHEN 5-325 MG PO TABS: 1.0000 | ORAL_TABLET | Freq: Four times a day (QID) | ORAL | Status: DC | PRN

## 2012-08-12 MED ORDER — MUPIROCIN 2 % EX OINT: TOPICAL_OINTMENT | Freq: Three times a day (TID) | CUTANEOUS | Status: DC

## 2012-08-12 MED ORDER — DOXYCYCLINE HYCLATE 100 MG PO CAPS: 100.0000 mg | ORAL_CAPSULE | Freq: Two times a day (BID) | ORAL | Status: DC

## 2012-08-12 NOTE — Addendum Note (Signed)
Addended by: Bertram Savin A on: 08/12/2012 11:55 AM   Modules accepted: Orders

## 2012-08-12 NOTE — Addendum Note (Signed)
Addended by: Rushie Goltz on: 08/12/2012 12:15 PM   Modules accepted: Orders

## 2012-08-12 NOTE — Progress Notes (Signed)
Patient presented to the office today stating that as of last week she began feeling this bulging sensation and tender but very small in the upper aspect of the midline scar incision from surgery many years ago. Patient is a type II diabetic. Patient stated she had a C-section in 1984 and then in the 1990s she had an abdominal hysterectomy with bilateral salpingo-oophorectomy. She has both a midline incision and a Pfannenstiel incision.   Exam:  Physical Exam  Abdominal:    it appears that patient has a small subcutaneous abscess. Patient was counseled to have it I indeed in the office. The area was cleansed with Betadine solution. 1% lidocaine for (approximately 3-4 cc) was infiltrated subcutaneously. A small stab incision was made and purulent material extruded. Culture for MRSA was obtained. The loculations were broken down with a curved hemostat. The area was copiously irrigated with hydrogen peroxide. A small gauze wick was placed for hemostasis. Patient will return back tomorrow to remove the wick. She will be placed on Vibramycin 100 mg twice a day for 2 weeks. Starting tomorrow she will debris the area at home in the shower and afterwards apply Bactroban and type body cream to the area twice a week. I've given her prescription for Percocet 5-325 mg 1 by mouth every 4-6 hours when necessary. She will take her blood pressure medication that she did not take today.

## 2012-08-12 NOTE — Telephone Encounter (Signed)
error 

## 2012-08-13 ENCOUNTER — Ambulatory Visit (INDEPENDENT_AMBULATORY_CARE_PROVIDER_SITE_OTHER): Payer: BC Managed Care – PPO | Admitting: Gynecology

## 2012-08-13 ENCOUNTER — Encounter: Payer: Self-pay | Admitting: Gynecology

## 2012-08-13 VITALS — BP 128/84

## 2012-08-13 DIAGNOSIS — IMO0002 Reserved for concepts with insufficient information to code with codable children: Secondary | ICD-10-CM

## 2012-08-13 NOTE — Progress Notes (Signed)
Patient presented to the office today for removal of packing and for debridement from an incisional assess see previous note dated April 14 for details and picture. Culture results are pending at time of this dictation.  Dressing was removed. The packing was removed. The area was debrided with hydrogen peroxide the edges were cleaned. Less erythema then yesterday and less induration. Neosporin was then placed on the edges and a Band-Aid and a gauze dressing applied on top of was placed.  Patient will do home debridement by standing in front and shower for 10 or 15 minutes after cleaning well with antibacterial soap. She will use the hair  Dreyer 6 inches away to help the area dry and then  apply the Bactroban cream  3 times a day. She has started her Vibramycin 100 mg one by mouth twice a day which she will take for 2 weeks. She'll return back to the office in one week for followup.

## 2012-08-14 ENCOUNTER — Encounter: Payer: Self-pay | Admitting: Gynecology

## 2012-08-15 LAB — WOUND CULTURE
Gram Stain: NONE SEEN
Gram Stain: NONE SEEN

## 2012-08-20 ENCOUNTER — Other Ambulatory Visit: Payer: Self-pay | Admitting: Gynecology

## 2012-08-20 ENCOUNTER — Ambulatory Visit: Payer: BC Managed Care – PPO | Admitting: Gynecology

## 2012-08-20 MED ORDER — DOXYCYCLINE HYCLATE 100 MG PO CAPS: 100.0000 mg | ORAL_CAPSULE | Freq: Two times a day (BID) | ORAL | Status: DC

## 2012-08-26 ENCOUNTER — Encounter: Payer: Self-pay | Admitting: Gynecology

## 2012-08-26 ENCOUNTER — Other Ambulatory Visit: Payer: Self-pay | Admitting: Gynecology

## 2012-08-26 ENCOUNTER — Telehealth: Payer: Self-pay | Admitting: *Deleted

## 2012-08-26 ENCOUNTER — Ambulatory Visit (INDEPENDENT_AMBULATORY_CARE_PROVIDER_SITE_OTHER): Payer: BC Managed Care – PPO | Admitting: Gynecology

## 2012-08-26 DIAGNOSIS — R300 Dysuria: Secondary | ICD-10-CM

## 2012-08-26 DIAGNOSIS — L293 Anogenital pruritus, unspecified: Secondary | ICD-10-CM

## 2012-08-26 DIAGNOSIS — N898 Other specified noninflammatory disorders of vagina: Secondary | ICD-10-CM

## 2012-08-26 DIAGNOSIS — N951 Menopausal and female climacteric states: Secondary | ICD-10-CM

## 2012-08-26 DIAGNOSIS — Z22322 Carrier or suspected carrier of Methicillin resistant Staphylococcus aureus: Secondary | ICD-10-CM

## 2012-08-26 LAB — URINALYSIS W MICROSCOPIC + REFLEX CULTURE
Bilirubin Urine: NEGATIVE
Casts: NONE SEEN
Crystals: NONE SEEN
Glucose, UA: 100 mg/dL — AB
Ketones, ur: NEGATIVE mg/dL
Specific Gravity, Urine: 1.015 (ref 1.005–1.030)
Urobilinogen, UA: 0.2 mg/dL (ref 0.0–1.0)
pH: 7 (ref 5.0–8.0)

## 2012-08-26 LAB — WET PREP FOR TRICH, YEAST, CLUE: Clue Cells Wet Prep HPF POC: NONE SEEN

## 2012-08-26 MED ORDER — FLUCONAZOLE 100 MG PO TABS: 100.0000 mg | ORAL_TABLET | Freq: Every day | ORAL | Status: DC

## 2012-08-26 MED ORDER — FLUCONAZOLE 100 MG PO TABS: ORAL_TABLET | ORAL | Status: DC

## 2012-08-26 MED ORDER — DOXYCYCLINE HYCLATE 100 MG PO CAPS: 100.0000 mg | ORAL_CAPSULE | Freq: Two times a day (BID) | ORAL | Status: DC

## 2012-08-26 NOTE — Patient Instructions (Addendum)
Candidal Vulvovaginitis Candidal vulvovaginitis is an infection of the vagina and vulva. The vulva is the skin around the opening of the vagina. This may cause itching and discomfort in and around the vagina.  HOME CARE  Only take medicine as told by your doctor.  Do not have sex (intercourse) until the infection is healed or as told by your doctor.  Practice safe sex.  Tell your sex partner about your infection.  Do not douche or use tampons.  Wear cotton underwear. Do not wear tight pants or panty hose.  Eat yogurt. This may help treat and prevent yeast infections. GET HELP RIGHT AWAY IF:   You have a fever.  Your problems get worse during treatment or do not get better in 3 days.  You have discomfort, irritation, or itching in your vagina or vulva area.  You have pain after sex.  You start to get belly (abdominal) pain. MAKE SURE YOU:  Understand these instructions.  Will watch your condition.  Will get help right away if you are not doing well or get worse. Document Released: 07/14/2008 Document Revised: 07/10/2011 Document Reviewed: 07/14/2008 Exit[employer] Patient Information 2013 [employer].  

## 2012-08-26 NOTE — Progress Notes (Signed)
Patient presented to the office today for her followup after having had an incision and drainage of midline incisional abscess in the area of an old keloid . Cultures came back MRSA and patient's prior to culture results had been placed on Vibramycin 100 mg twice a day but she only took it for one week and prescription was given today for 10 more days. She is doing otherwise well but has complaining of some vaginal pruritus. Patient also had noted some slight dysuria.  Exam: Previous midline incision and drainage site almost completely healed nonerythematous nontender minimal induration.  Wet prep few bacteria  Urinalysis 0-2 RBC and few bacteria culture pending  Assessment/plan: Patient doing well after incision and drainage of midline keloid abscess we'll need to continue 10 more days of Vibramycin. microorganisms isolated was MRSA and has responded well. She will continue to apply Bactroban cream twice a day for 2 more weeks. She was given a prescription of Diflucan 100 mg to take 1 by mouth when necessary. Will await the results of the urine culture. She is due for her annual exam in July of this year and we'll have her bone density study a week before the office visit.

## 2012-08-26 NOTE — Telephone Encounter (Signed)
Pharmacy called regarding directions for the diflucan rx sent today for # 3 tablet, rx re-sent with correct direction take tablet every other day #3

## 2012-08-30 ENCOUNTER — Ambulatory Visit: Payer: BC Managed Care – PPO | Admitting: Gynecology

## 2012-11-13 ENCOUNTER — Encounter: Payer: BC Managed Care – PPO | Admitting: Gynecology

## 2013-01-18 ENCOUNTER — Emergency Department (HOSPITAL_COMMUNITY): Payer: BC Managed Care – PPO

## 2013-01-18 ENCOUNTER — Emergency Department (HOSPITAL_COMMUNITY)
Admission: EM | Admit: 2013-01-18 | Discharge: 2013-01-18 | Disposition: A | Discharge status: Home / Self Care | Payer: BC Managed Care – PPO | Attending: Emergency Medicine | Admitting: Emergency Medicine

## 2013-01-18 ENCOUNTER — Encounter (HOSPITAL_COMMUNITY): Payer: Self-pay | Admitting: Emergency Medicine

## 2013-01-18 DIAGNOSIS — M549 Dorsalgia, unspecified: Secondary | ICD-10-CM | POA: Insufficient documentation

## 2013-01-18 DIAGNOSIS — R1110 Vomiting, unspecified: Secondary | ICD-10-CM | POA: Insufficient documentation

## 2013-01-18 DIAGNOSIS — Z79899 Other long term (current) drug therapy: Secondary | ICD-10-CM | POA: Insufficient documentation

## 2013-01-18 DIAGNOSIS — Z7982 Long term (current) use of aspirin: Secondary | ICD-10-CM | POA: Insufficient documentation

## 2013-01-18 DIAGNOSIS — Z853 Personal history of malignant neoplasm of breast: Secondary | ICD-10-CM | POA: Insufficient documentation

## 2013-01-18 DIAGNOSIS — R059 Cough, unspecified: Secondary | ICD-10-CM | POA: Insufficient documentation

## 2013-01-18 DIAGNOSIS — R05 Cough: Secondary | ICD-10-CM | POA: Insufficient documentation

## 2013-01-18 DIAGNOSIS — E669 Obesity, unspecified: Secondary | ICD-10-CM | POA: Insufficient documentation

## 2013-01-18 DIAGNOSIS — E1149 Type 2 diabetes mellitus with other diabetic neurological complication: Secondary | ICD-10-CM | POA: Insufficient documentation

## 2013-01-18 DIAGNOSIS — J209 Acute bronchitis, unspecified: Secondary | ICD-10-CM | POA: Insufficient documentation

## 2013-01-18 DIAGNOSIS — Z87891 Personal history of nicotine dependence: Secondary | ICD-10-CM | POA: Insufficient documentation

## 2013-01-18 DIAGNOSIS — J40 Bronchitis, not specified as acute or chronic: Secondary | ICD-10-CM

## 2013-01-18 DIAGNOSIS — I10 Essential (primary) hypertension: Secondary | ICD-10-CM | POA: Insufficient documentation

## 2013-01-18 DIAGNOSIS — G609 Hereditary and idiopathic neuropathy, unspecified: Secondary | ICD-10-CM | POA: Insufficient documentation

## 2013-01-18 DIAGNOSIS — G8929 Other chronic pain: Secondary | ICD-10-CM | POA: Insufficient documentation

## 2013-01-18 LAB — CBC WITH DIFFERENTIAL/PLATELET
Basophils Relative: 1 % (ref 0–1)
Eosinophils Absolute: 0.3 10*3/uL (ref 0.0–0.7)
HCT: 36.2 % (ref 36.0–46.0)
Hemoglobin: 12.5 g/dL (ref 12.0–15.0)
Lymphs Abs: 2.9 10*3/uL (ref 0.7–4.0)
MCH: 28.8 pg (ref 26.0–34.0)
MCHC: 34.5 g/dL (ref 30.0–36.0)
MCV: 83.4 fL (ref 78.0–100.0)
Monocytes Absolute: 0.5 10*3/uL (ref 0.1–1.0)
Monocytes Relative: 6 % (ref 3–12)
RBC: 4.34 MIL/uL (ref 3.87–5.11)

## 2013-01-18 LAB — BASIC METABOLIC PANEL
BUN: 12 mg/dL (ref 6–23)
Creatinine, Ser: 0.75 mg/dL (ref 0.50–1.10)
GFR calc Af Amer: >90 mL/min (ref >90)
GFR calc non Af Amer: >90 mL/min (ref >90)
Glucose, Bld: 237 mg/dL — ABNORMAL HIGH (ref 70–99)

## 2013-01-18 LAB — POCT I-STAT TROPONIN I: Troponin i, poc: 0.01 ng/mL (ref 0.00–0.08)

## 2013-01-18 MED ORDER — BENZONATATE 100 MG PO CAPS: 100.0000 mg | ORAL_CAPSULE | Freq: Three times a day (TID) | ORAL | Status: DC

## 2013-01-18 MED ORDER — HYDROCODONE-ACETAMINOPHEN 5-325 MG PO TABS
1.0000 | ORAL_TABLET | Freq: Once | ORAL | Status: AC
  Administered 2013-01-18: 1 via ORAL
  Filled 2013-01-18: qty 1

## 2013-01-18 MED ORDER — AZITHROMYCIN 250 MG PO TABS: ORAL_TABLET | ORAL | Status: DC

## 2013-01-18 MED ORDER — HYDROCOD POLST-CHLORPHEN POLST 10-8 MG/5ML PO LQCR: 5.0000 mL | Freq: Two times a day (BID) | ORAL | Status: DC

## 2013-01-18 MED ORDER — ASPIRIN 81 MG PO CHEW
324.0000 mg | CHEWABLE_TABLET | Freq: Once | ORAL | Status: AC
  Administered 2013-01-18: 324 mg via ORAL
  Filled 2013-01-18: qty 4

## 2013-01-18 NOTE — ED Notes (Signed)
C/o chest pain off and on for over two weeks.  Initially pain did not last long but has noted increasing pain over past few days.  Pain squeezing in center of chest, goes to arms.  Occ shortness of breath.  Has been treating self for cold symptoms.  Has been coughing to the point of vomiting.

## 2013-01-18 NOTE — ED Notes (Signed)
Patient transported to X-ray 

## 2013-01-18 NOTE — ED Provider Notes (Signed)
CSN: [id number]     Arrival date & time 01/18/13  7829 History   First MD Initiated Contact with Patient 01/18/13 [phone]     Chief Complaint  Patient presents with  . Chest Pain    HPI  PCP is Dr. Rennie Natter. Patient presents with 2 weeks of intermittent chest pain. She's also had a cough for 2 weeks. On the third fourth day for coughing she started developing some pain in her chest. She states that she lays flat she gets more cough. She has been sleeping in an upright position because she has less cough.   She does not feel short of breath. Doesn't really describe frank orthopnea, just less coughing.  She has no extremity swelling. . She points to her parasternal anterior chest and posterior hand across both of these areas  She was walking with her husband, going to visit someone at a prison on Sunday.  She had to walk up a short hill and she was limited by coughing, and sharp pain in her anterior chest.. She has no history of coronary artery disease. She states she at one point was told she might have had some fluid in her lungs. Is not on treatment for CHF or coronary artery disease currently. No fevers. Dry nonproductive cough. Sometimes his coughing to the point of near emesis. Today has had more frequent episodes of pan i, and less of a cough.  Past Medical History  Diagnosis Date  . Hypertension   . Diabetes mellitus     TYPE 2  . Osteopenia   . 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 Hx of radiation therapy 04/30/07-06/21/07    right breast /4680 cgy/26 fractions with Breast Boost for  total dose of 6080 cGy  . Peripheral neuropathy   . Chronic back pain     on disability  . History of mammogram 01/04/11    last done b/l mammogram 01/04/11  . Breast cancer     right breast lumpectomy  . H/O mammogram 02/13/12    right breast   . Musculoskeletal pain     rule out fibromyalgia  . Sleep apnea     rule out progression  . Morbid obesity   . Colonic dysfunction   . Sleep  disturbance   . Muscle cramps    Past Surgical History  Procedure Laterality Date  . Cesarean section    . Back surgery    . Breast surgery  2008    MULTIPLE LUMPECTOMIES, BREAST CANCER 2008-RIGHT BR  . Abdominal hysterectomy      BSO/DR. HAYGOOD  . 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 Uterine cancer Maternal Aunt     questionable  hx  . Heart disease Paternal Grandfather    History  Substance Use Topics  . Smoking status: Former Smoker    Quit date: 10/31/1987  . Smokeless tobacco: Never Used  . Alcohol Use: No   OB History   Grav Para Term Preterm Abortions TAB SAB Ect Mult Living   7 4 3 1 2 1    4      Obstetric Comments   Menarche age 22, Parity, G7, P4, surgical Menopause, No HRT Use     Review of Systems  Constitutional: Negative for fever, chills, diaphoresis, appetite change and fatigue.  HENT: Negative for sore throat, mouth sores  and trouble swallowing.   Eyes: Negative for visual disturbance.  Respiratory: Positive for cough. Negative for chest tightness, shortness of breath and wheezing.        No sputum production  Cardiovascular: Positive for chest pain.       Pain is described as pleuritic and with cough. Not pain or pressure. Not with exertion.  Gastrointestinal: Positive for vomiting. Negative for nausea, abdominal pain, diarrhea and abdominal distention.       One episode of posttussive emesis no nausea. No diaphoresis.  Endocrine: Negative for polydipsia, polyphagia and polyuria.  Genitourinary: Negative for dysuria, frequency and hematuria.  Musculoskeletal: Negative for gait problem.  Skin: Negative for color change, pallor and rash.  Neurological: Negative for dizziness, syncope, light-headedness and headaches.  Hematological: Does not bruise/bleed easily.  Psychiatric/Behavioral: Negative for behavioral problems and  confusion.    Allergies  Review of patient's allergies indicates no known allergies.  Home Medications   Current Outpatient Rx  Name  Route  Sig  Dispense  Refill  . ACCU-CHEK ACTIVE STRIPS test strip               . amLODipine (NORVASC) 5 MG tablet   Oral   Take 5 mg by mouth 2 (two) times daily.         Marland Kitchen aspirin 81 MG chewable tablet   Oral   Chew 81 mg by mouth daily.         . metFORMIN (GLUCOPHAGE) 500 MG tablet   Oral   Take 500 mg by mouth 2 (two) times daily with a meal.           . naproxen (NAPROSYN) 500 MG tablet   Oral   Take 1 tablet (500 mg total) by mouth 2 (two) times daily with a meal.   30 tablet   0   . polyethylene glycol (MIRALAX / GLYCOLAX) packet   Oral   Take 17 g by mouth daily as needed (constipation).          Marland Kitchen spironolactone (ALDACTONE) 25 MG tablet   Oral   Take 25 mg by mouth 2 (two) times daily.         Marland Kitchen azithromycin (ZITHROMAX Z-PAK) 250 MG tablet      X 5 days as directed.   6 each   0   . benzonatate (TESSALON) 100 MG capsule   Oral   Take 1 capsule (100 mg total) by mouth every 8 (eight) hours.   21 capsule   0   . chlorpheniramine-HYDROcodone (TUSSIONEX PENNKINETIC ER) 10-8 MG/5ML LQCR   Oral   Take 5 mLs by mouth every 12 (twelve) hours.   60 mL   0    BP 158/77  Pulse 72  Temp(Src) 98.1 F (36.7 C) (Oral)  Resp 25  Ht 5\' 3"  (1.6 m)  Wt 245 lb (111.131 kg)  BMI 43.41 kg/m2  SpO2 93% Physical Exam  Constitutional: She is oriented to person, place, and time. She appears well-developed and well-nourished. No distress.  Morbid obesity. She is in no distress.  HENT:  Head: Normocephalic.  Eyes: Conjunctivae are normal. Pupils are equal, round, and reactive to light. No scleral icterus.  Neck: Normal range of motion. Neck supple. No thyromegaly present.  Cardiovascular: Normal rate, regular rhythm, S1 normal, S2 normal and normal heart sounds.  Exam reveals no gallop and no friction rub.   No  murmur heard. Sinus rhythm on the monitor. No ectopy. Not tachycardic at rest.  Pulmonary/Chest: Effort normal and breath sounds normal. No respiratory distress. She has no wheezes. She has no rhonchi. She has no rales.  Lee distant breath sounds but no focal signs of consolidation or focal diminished breath sounds no prolongation.  Abdominal: Soft. Bowel sounds are normal. She exhibits no distension. There is no tenderness. There is no rebound.  Musculoskeletal: Normal range of motion.  Neurological: She is alert and oriented to person, place, and time.  Skin: Skin is warm and dry. No rash noted.  No lower extremity edema  Psychiatric: She has a normal mood and affect. Her behavior is normal.    ED Course  Procedures (including critical care time)  EKG: Indication chest pain interpretation sinus rhythm no acute or ischemic changes no injury no ectopy upright T waves. Labs Review Labs Reviewed  BASIC METABOLIC PANEL - Abnormal; Notable for the following:    Potassium 3.1 (*)    Glucose, Bld 237 (*)    All other components within normal limits  CBC WITH DIFFERENTIAL  PRO B NATRIURETIC PEPTIDE  TROPONIN I  POCT I-STAT TROPONIN I   Imaging Review Dg Chest 2 View  01/18/2013   *RADIOLOGY REPORT*  Clinical Data: Chest pain  CHEST - 2 VIEW  Comparison: January 29, 2007  Findings: There is no focal infiltrate, pulmonary edema, or pleural effusion.  The aorta is tortuous.  The heart size is normal.  The patient is status post posterior fusion of the lumbar spine.  The soft tissues are normal.  IMPRESSION: No acute cardiopulmonary disease identified.   Original Report Authenticated By: Sherian Rein, M.D.    MDM  Differential diagnosis. ACS, bronchitis, pneumonia. Doubt pulmonary emboli as she is not short of breath, tachycardic, or having persistent pain. Does not sound classically  Anginal.  The pain is intermittent.  It is associated with cough, but not only with coughing. EKG showed no  acute ischemic changes no injury or. We'll evaluate with serial troponins chest x-ray,  symptomatic treatment for cough, and pain reevaluation after the above.  Second troponin normal. I think appropriate for outpatient treatment cough suppressants prescription for Zithromax primary care followup recheck with any worsening symptoms.     Roney Marion, MD 01/19/13 [phone]

## 2013-01-18 NOTE — ED Notes (Signed)
Patient returned from X-ray 

## 2013-05-21 ENCOUNTER — Other Ambulatory Visit: Payer: Self-pay | Admitting: Family Medicine

## 2013-05-21 ENCOUNTER — Ambulatory Visit
Admission: RE | Admit: 2013-05-21 | Discharge: 2013-05-21 | Disposition: A | Discharge status: Home / Self Care | Payer: BC Managed Care – PPO | Source: Ambulatory Visit | Attending: Family Medicine | Admitting: Family Medicine

## 2013-05-21 DIAGNOSIS — M79601 Pain in right arm: Secondary | ICD-10-CM

## 2013-05-21 DIAGNOSIS — M25511 Pain in right shoulder: Secondary | ICD-10-CM

## 2013-08-21 ENCOUNTER — Ambulatory Visit (INDEPENDENT_AMBULATORY_CARE_PROVIDER_SITE_OTHER): Payer: BC Managed Care – PPO

## 2013-08-21 DIAGNOSIS — M8580 Other specified disorders of bone density and structure, unspecified site: Secondary | ICD-10-CM

## 2013-08-21 DIAGNOSIS — N951 Menopausal and female climacteric states: Secondary | ICD-10-CM

## 2013-08-21 DIAGNOSIS — M949 Disorder of cartilage, unspecified: Secondary | ICD-10-CM

## 2013-08-21 DIAGNOSIS — M899 Disorder of bone, unspecified: Secondary | ICD-10-CM

## 2013-08-25 ENCOUNTER — Other Ambulatory Visit: Payer: Self-pay | Admitting: Gynecology

## 2013-08-25 DIAGNOSIS — M8580 Other specified disorders of bone density and structure, unspecified site: Secondary | ICD-10-CM

## 2013-10-08 ENCOUNTER — Encounter: Payer: BC Managed Care – PPO | Admitting: Gynecology

## 2013-11-04 ENCOUNTER — Encounter: Payer: Self-pay | Admitting: Gynecology

## 2013-11-04 ENCOUNTER — Ambulatory Visit (INDEPENDENT_AMBULATORY_CARE_PROVIDER_SITE_OTHER): Payer: BC Managed Care – PPO | Admitting: Gynecology

## 2013-11-04 VITALS — BP 146/92 | Ht 60.0 in | Wt 234.0 lb

## 2013-11-04 DIAGNOSIS — M8580 Other specified disorders of bone density and structure, unspecified site: Secondary | ICD-10-CM

## 2013-11-04 DIAGNOSIS — M949 Disorder of cartilage, unspecified: Secondary | ICD-10-CM

## 2013-11-04 DIAGNOSIS — J040 Acute laryngitis: Secondary | ICD-10-CM

## 2013-11-04 DIAGNOSIS — R300 Dysuria: Secondary | ICD-10-CM

## 2013-11-04 DIAGNOSIS — Z8639 Personal history of other endocrine, nutritional and metabolic disease: Secondary | ICD-10-CM

## 2013-11-04 DIAGNOSIS — M899 Disorder of bone, unspecified: Secondary | ICD-10-CM

## 2013-11-04 DIAGNOSIS — N952 Postmenopausal atrophic vaginitis: Secondary | ICD-10-CM

## 2013-11-04 DIAGNOSIS — Z01419 Encounter for gynecological examination (general) (routine) without abnormal findings: Secondary | ICD-10-CM

## 2013-11-04 LAB — URINALYSIS W MICROSCOPIC + REFLEX CULTURE
BILIRUBIN URINE: NEGATIVE
Casts: NONE SEEN
Crystals: NONE SEEN
GLUCOSE, UA: 100 mg/dL — AB
Ketones, ur: NEGATIVE mg/dL
Leukocytes, UA: NEGATIVE
Nitrite: NEGATIVE
Specific Gravity, Urine: 1.015 (ref 1.005–1.030)
UROBILINOGEN UA: 1 mg/dL (ref 0.0–1.0)
WBC UA: NONE SEEN WBC/hpf (ref <3)
pH: 6.5 (ref 5.0–8.0)

## 2013-11-04 NOTE — Progress Notes (Signed)
Tara Summers 1950/12/06 [id number]   History:    63 y.o.  for annual gyn exam who has not been seen in the office since July of 2013. Patient recently developed a laryngitis which he was out of town. Patient states she had a cough which is cleared. She was also having some frequency and questionable dysuria but no fever chills nausea or vomiting or any back pain.  Patient stated she had a colonoscopy in 2014 at [facility] and was informed that she had some small polyp in the good followup which she scheduling here in [place], [place].  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  Patient has a history of right breast cancer whereby she had a right lumpectomy and is currently being followed by the oncologist Dr. Sondra Come.   Her primary physician has been following her for her hypertension as well as for diabetes who has been doing her blood work. Her last bone density was in 2014 in the lowest T score was -1.1 at the [facility]. Patient states that she is taking her calcium and vitamin D. Patient with no prior history of abnormal Pap smear even before her hysterectomy.   Past medical history,surgical history, family history and social history were all reviewed and documented in the EPIC chart.  Gynecologic History No LMP recorded. Patient has had a hysterectomy. Contraception: status post hysterectomy Last Pap: 2011. Results were: normal Last mammogram: 2013. Results were: normal  Obstetric History OB History  Gravida Para Term Preterm AB SAB TAB Ectopic Multiple Living  7 4 3 1 2  1   4     # Outcome Date GA Lbr Len/2nd Weight Sex Delivery Anes PTL Lv  7 ABT           6 TAB           5 TRM     F CS  N Y  4 PRE     F CS  Y Y  3 TRM     F SVD  N Y  2 TRM     F SVD  N Y  1 GRA             Obstetric Comments  Menarche age 65, Parity, G7, P4, surgical Menopause, No HRT Use     ROS: A ROS was performed and pertinent positives and negatives are  included in the history.  GENERAL: No fevers or chills. HEENT: No change in vision, no earache, sore throat or sinus congestion. NECK: No pain or stiffness. CARDIOVASCULAR: No chest pain or pressure. No palpitations. PULMONARY: No shortness of breath, cough or wheeze. GASTROINTESTINAL: No abdominal pain, nausea, vomiting or diarrhea, melena or bright red blood per rectum. GENITOURINARY: No urinary frequency, urgency, hesitancy or dysuria. MUSCULOSKELETAL: No joint or muscle pain, no back pain, no recent trauma. DERMATOLOGIC: No rash, no itching, no lesions. ENDOCRINE: No polyuria, polydipsia, no heat or cold intolerance. No recent change in weight. HEMATOLOGICAL: No anemia or easy bruising or bleeding. NEUROLOGIC: No headache, seizures, numbness, tingling or weakness. PSYCHIATRIC: No depression, no loss of interest in normal activity or change in sleep pattern.     Exam: chaperone present  BP 146/92  Ht 5' (1.524 m)  Wt 234 lb (106.142 kg)  BMI 45.70 kg/m2  Body mass index is 45.7 kg/(m^2).  General appearance : Well developed well nourished female. No acute distress HEENT: Neck supple, trachea midline, no carotid bruits, no  thyroidmegaly Lungs: Clear to auscultation, no rhonchi or wheezes, or rib retractions  Heart: Regular rate and rhythm, no murmurs or gallops Breast:Examined in sitting and supine position were symmetrical in appearance, no palpable masses or tenderness,  no skin retraction, no nipple inversion, no nipple discharge, no skin discoloration, no axillary or supraclavicular lymphadenopathy Abdomen: no palpable masses or tenderness, no rebound or guarding Extremities: no edema or skin discoloration or tenderness  Pelvic:  Bartholin, Urethra, Skene Glands: Within normal limitsNo gross lesions or discharge  Uterus  Absent             Vagina: No gross lesions or discharge  Cervix: Absent  Adnexa  Without masses or tenderness  Anus and perineum  normal   Rectovaginal  normal  sphincter tone without palpated masses or tenderness             Hemoccult recent colonoscopy     Assessment/Plan:  63 y.o. female for annual exam with past history of vitamin D deficiency. We will check her vitamin D level today. Her PCP will be drawn and the rest of her blood work. Patient with what appears to be a viral laryngitis was instructed to rest her voice and to drink warm T. if it does not clear up in the next week-10 days I will refer her to an ENT for further evaluation. Pap smear not done today in accordance to the new guidelines. She will follow up with her gastroenterologist. She will be needing a bone density study next year. She was given a requisition to schedule her mammogram. Patient is on no hormone replacement therapy. We discussed importance of calcium and vitamin D and regular exercise for osteoporosis prevention. We discussed also the importance of monthly breast exam.  Note: This dictation was prepared with  Dragon/digital dictation along withSmart phrase technology. Any transcriptional errors that result from this process are unintentional.   Terrance Mass MD, 11:34 AM 11/04/2013

## 2013-11-04 NOTE — Patient Instructions (Signed)
Laryngitis °At the top of your windpipe is your voice box. It is the source of your voice. Inside your voice box are 2 bands of muscles called vocal cords. When you breathe, your vocal cords are relaxed and open so that air can get into the lungs. When you decide to say something, these cords come together and vibrate. The sound from these vibrations goes into your throat and comes out through your mouth as sound.  °Laryngitis is an inflammation of the vocal cords that causes hoarseness, cough, loss of voice, sore throat, and dry throat. Laryngitis can be temporary (acute) or long-term (chronic). Most cases of acute laryngitis improve with time.Chronic laryngitis lasts for more than 3 weeks. °CAUSES °Laryngitis can often be related to excessive smoking, talking, or yelling, as well as inhalation of toxic fumes and allergies. Acute laryngitis is usually caused by a viral infection, vocal strain, measles or mumps, or bacterial infections. Chronic laryngitis is usually caused by vocal cord strain, vocal cord injury, postnasal drip, growths on the vocal cords, or acid reflux. °SYMPTOMS  °· Cough. °· Sore throat. °· Dry throat. °RISK FACTORS °· Respiratory infections. °· Exposure to irritating substances, such as cigarette smoke, excessive amounts of alcohol, stomach acids, and workplace chemicals. °· Voice trauma, such as vocal cord injury from shouting or speaking too loud. °DIAGNOSIS  °Your cargiver will perform a physical exam. During the physical exam, your caregiver will examine your throat. The most common sign of laryngitis is hoarseness. Laryngoscopy may be necessary to confirm the diagnosis of this condition. This procedure allows your caregiver to look into the larynx. °HOME CARE INSTRUCTIONS °· Drink enough fluids to keep your urine clear or pale yellow. °· Rest until you no longer have symptoms or as directed by your caregiver. °· Breathe in moist air. °· Take all medicine as directed by your  caregiver. °· Do not smoke. °· Talk as little as possible (this includes whispering). °· Write on paper instead of talking until your voice is back to normal. °· Follow up with your caregiver if your condition has not improved after 10 days. °SEEK MEDICAL CARE IF:  °· You have trouble breathing. °· You cough up blood. °· You have persistent fever. °· You have increasing pain. °· You have difficulty swallowing. °MAKE SURE YOU: °· Understand these instructions. °· Will watch your condition. °· Will get help right away if you are not doing well or get worse. °Document Released: 04/17/2005 Document Revised: 07/10/2011 Document Reviewed: 06/23/2010 °Exit[employer]® Patient Information ©2015 [employer]. This information is not intended to replace advice given to you by your health care provider. Make sure you discuss any questions you have with your health care provider. ° °

## 2013-11-05 LAB — VITAMIN D 25 HYDROXY (VIT D DEFICIENCY, FRACTURES): VIT D 25 HYDROXY: 30 ng/mL (ref 30–89)

## 2013-11-05 LAB — URINE CULTURE
COLONY COUNT: NO GROWTH
ORGANISM ID, BACTERIA: NO GROWTH

## 2014-01-16 ENCOUNTER — Ambulatory Visit: Payer: BC Managed Care – PPO | Admitting: Women's Health

## 2014-03-02 ENCOUNTER — Encounter: Payer: Self-pay | Admitting: Gynecology

## 2014-08-23 ENCOUNTER — Emergency Department (HOSPITAL_COMMUNITY): Payer: BC Managed Care – PPO

## 2014-08-23 ENCOUNTER — Encounter (HOSPITAL_COMMUNITY): Payer: Self-pay | Admitting: Emergency Medicine

## 2014-08-23 ENCOUNTER — Emergency Department (HOSPITAL_COMMUNITY)
Admission: EM | Admit: 2014-08-23 | Discharge: 2014-08-23 | Disposition: A | Discharge status: Home / Self Care | Payer: BC Managed Care – PPO | Attending: Emergency Medicine | Admitting: Emergency Medicine

## 2014-08-23 DIAGNOSIS — S6992XA Unspecified injury of left wrist, hand and finger(s), initial encounter: Secondary | ICD-10-CM | POA: Diagnosis present

## 2014-08-23 DIAGNOSIS — S60222A Contusion of left hand, initial encounter: Secondary | ICD-10-CM | POA: Insufficient documentation

## 2014-08-23 DIAGNOSIS — Y9289 Other specified places as the place of occurrence of the external cause: Secondary | ICD-10-CM | POA: Insufficient documentation

## 2014-08-23 DIAGNOSIS — S93401A Sprain of unspecified ligament of right ankle, initial encounter: Secondary | ICD-10-CM | POA: Diagnosis not present

## 2014-08-23 DIAGNOSIS — Z79899 Other long term (current) drug therapy: Secondary | ICD-10-CM | POA: Diagnosis not present

## 2014-08-23 DIAGNOSIS — Y9389 Activity, other specified: Secondary | ICD-10-CM | POA: Diagnosis not present

## 2014-08-23 DIAGNOSIS — Z853 Personal history of malignant neoplasm of breast: Secondary | ICD-10-CM | POA: Insufficient documentation

## 2014-08-23 DIAGNOSIS — Y998 Other external cause status: Secondary | ICD-10-CM | POA: Insufficient documentation

## 2014-08-23 DIAGNOSIS — E119 Type 2 diabetes mellitus without complications: Secondary | ICD-10-CM | POA: Diagnosis not present

## 2014-08-23 DIAGNOSIS — Z7982 Long term (current) use of aspirin: Secondary | ICD-10-CM | POA: Insufficient documentation

## 2014-08-23 DIAGNOSIS — G8929 Other chronic pain: Secondary | ICD-10-CM | POA: Insufficient documentation

## 2014-08-23 DIAGNOSIS — M25562 Pain in left knee: Secondary | ICD-10-CM

## 2014-08-23 DIAGNOSIS — I10 Essential (primary) hypertension: Secondary | ICD-10-CM | POA: Insufficient documentation

## 2014-08-23 DIAGNOSIS — Z87891 Personal history of nicotine dependence: Secondary | ICD-10-CM | POA: Diagnosis not present

## 2014-08-23 DIAGNOSIS — S8992XA Unspecified injury of left lower leg, initial encounter: Secondary | ICD-10-CM | POA: Insufficient documentation

## 2014-08-23 DIAGNOSIS — W01198A Fall on same level from slipping, tripping and stumbling with subsequent striking against other object, initial encounter: Secondary | ICD-10-CM | POA: Diagnosis not present

## 2014-08-23 DIAGNOSIS — Z8719 Personal history of other diseases of the digestive system: Secondary | ICD-10-CM | POA: Diagnosis not present

## 2014-08-23 DIAGNOSIS — Z791 Long term (current) use of non-steroidal anti-inflammatories (NSAID): Secondary | ICD-10-CM | POA: Diagnosis not present

## 2014-08-23 MED ORDER — HYDROCODONE-ACETAMINOPHEN 5-325 MG PO TABS
2.0000 | ORAL_TABLET | Freq: Once | ORAL | Status: AC
  Administered 2014-08-23: 2 via ORAL
  Filled 2014-08-23: qty 2

## 2014-08-23 MED ORDER — HYDROCODONE-ACETAMINOPHEN 5-325 MG PO TABS: 2.0000 | ORAL_TABLET | ORAL | Status: DC | PRN

## 2014-08-23 NOTE — ED Notes (Signed)
Pt from home c/o R ankle, L knee and L wrist pain after her L knee "went out", falling onto a tile floor. Pt did not hit her head and denies LOC. Pt denies neck and back pain. Pt has swelling to bilateral ankles, L knee, no deformity noted. Pt has good circulation and color to all extremities. Pt is A&O and in NAD

## 2014-08-23 NOTE — Discharge Instructions (Signed)

## 2014-08-23 NOTE — ED Provider Notes (Signed)
CSN: [id number]     Arrival date & time 08/23/14  1233 History  This chart was scribed for non-physician practitioner, Caryl Ada, PA-C, working with Lajean Saver, MD, by Peyton Bottoms ED Scribe. This patient was seen in room WTR8/WTR8 and the patient's care was started at 1:07 PM    Chief Complaint  Patient presents with  . Ankle Pain  . Knee Pain  . Wrist Pain   Patient is a 64 y.o. female presenting with ankle pain, knee pain, and wrist pain. The history is provided by the patient. No language interpreter was used.  Ankle Pain Location:  Knee and ankle Injury: no   Knee location:  L knee Ankle location:  R ankle Pain details:    Quality:  Aching   Radiates to:  Does not radiate   Severity:  Moderate   Onset quality:  Gradual Chronicity:  New Dislocation: no   Knee Pain Wrist Pain   HPI Comments: Tara Summers is a 64 y.o. female with a PMHx of hypertension, diabetes, osteopenia, breast cancer, who presents to the Emergency Department complaining of left knee pain onset earlier today after patient had a fall on a tile floor. She denies associated head impact or LOC. She also reports associated pain to right ankle and left hand. No medications were taken prior to arrival.  Past Medical History  Diagnosis Date  . Hypertension   . Diabetes mellitus     TYPE 2  . Osteopenia   . 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 Hx of radiation therapy 04/30/07-06/21/07    right breast /4680 cgy/26 fractions with Breast Boost for  total dose of 6080 cGy  . Peripheral neuropathy   . Chronic back pain     on disability  . History of mammogram 01/04/11    last done b/l mammogram 01/04/11  . Breast cancer     right breast lumpectomy  . H/O mammogram 02/13/12    right breast   . Musculoskeletal pain     rule out fibromyalgia  . Sleep apnea     rule out progression  . Morbid obesity   . Colonic dysfunction   . Sleep disturbance   . Muscle cramps    Past Surgical History   Procedure Laterality Date  . Cesarean section    . Back surgery    . Breast surgery  2008    MULTIPLE LUMPECTOMIES, BREAST CANCER 2008-RIGHT BR  . Abdominal hysterectomy      BSO/DR. HAYGOOD  . 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 Uterine cancer Maternal Aunt     questionable  hx  . Heart disease Paternal Grandfather    History  Substance Use Topics  . Smoking status: Former Smoker    Quit date: 10/31/1987  . Smokeless tobacco: Never Used  . Alcohol Use: No   OB History    Gravida Para Term Preterm AB TAB SAB Ectopic Multiple Living   7 4 3 1 2 1    4       Obstetric Comments   Menarche age 58, Parity, G7, P4, surgical Menopause, No HRT Use     Review of Systems  Musculoskeletal: Positive for joint swelling and arthralgias.  All other systems reviewed and are negative.  Allergies  Review of patient's allergies indicates no known allergies.  Home Medications  Prior to Admission medications   Medication Sig Start Date End Date Taking? Authorizing Provider  ACCU-CHEK ACTIVE STRIPS test strip  04/30/12   Historical Provider, MD  amLODipine (NORVASC) 5 MG tablet Take 5 mg by mouth 2 (two) times daily.    Historical Provider, MD  aspirin 81 MG chewable tablet Chew 81 mg by mouth daily.    Historical Provider, MD  azithromycin (ZITHROMAX Z-PAK) 250 MG tablet X 5 days as directed. 01/18/13   Tanna Furry, MD  benzonatate (TESSALON) 100 MG capsule Take 1 capsule (100 mg total) by mouth every 8 (eight) hours. 01/18/13   Tanna Furry, MD  chlorpheniramine-HYDROcodone (TUSSIONEX PENNKINETIC ER) 10-8 MG/5ML LQCR Take 5 mLs by mouth every 12 (twelve) hours. 01/18/13   Tanna Furry, MD  metFORMIN (GLUCOPHAGE) 500 MG tablet Take 500 mg by mouth 2 (two) times daily with a meal.      Historical Provider, MD  naproxen (NAPROSYN) 500 MG tablet Take 1 tablet (500 mg  total) by mouth 2 (two) times daily with a meal. 04/01/12   Noemi Chapel, MD  polyethylene glycol (MIRALAX / Floria Raveling) packet Take 17 g by mouth daily as needed (constipation).     Historical Provider, MD  PRESCRIPTION MEDICATION Blood pressure med ??? Name    Historical Provider, MD  spironolactone (ALDACTONE) 25 MG tablet Take 25 mg by mouth 2 (two) times daily.    Historical Provider, MD   Triage Vitals: BP 173/76 mmHg  Pulse 81  Temp(Src) 98 F (36.7 C) (Oral)  Resp 12  SpO2 98%  Physical Exam  Constitutional: She appears well-developed and well-nourished.  HENT:  Head: Normocephalic and atraumatic.  Eyes: Conjunctivae are normal. Right eye exhibits no discharge. Left eye exhibits no discharge.  Pulmonary/Chest: Effort normal. No respiratory distress.  Musculoskeletal:  Right ankle is swollen. Decreased ROM. Left knee is tender. Left hand diffusely tender. Neurovascularly and neuro sensitivity in tact.  Neurological: She is alert. Coordination normal.  Skin: Skin is warm and dry. No rash noted. She is not diaphoretic. No erythema.  Psychiatric: She has a normal mood and affect.  Nursing note and vitals reviewed.  ED Course  Procedures (including critical care time)  DIAGNOSTIC STUDIES: Oxygen Saturation is 98% on RA, normal by my interpretation.    COORDINATION OF CARE:  1:10 PM- Discussed plans to order diagnostic imaging of left knee, left hand and right ankle. Will give patient norco/vicodin for pain management. Pt advised of plan for treatment and pt agrees.  Labs Review Labs Reviewed - No data to display  Imaging Review Dg Ankle Complete Right  08/23/2014   CLINICAL DATA:  Fall, right ankle pain  EXAM: RIGHT ANKLE - COMPLETE 3+ VIEW  COMPARISON:  None.  FINDINGS: A sharply marginated osseous fragment adjacent to the medial malleolus likely representing avulsion fracture fragment. The margins are not well visualized on all views. There is no evidence of arthropathy or  dislocation. Diffuse soft tissue prominence is identified with more focal soft tissue swelling in the region of the medial malleolus.  IMPRESSION: Probable minimally displaced avulsion fracture of the medial malleolus with mild overlying soft tissue swelling. If the patient is not point tender at this area, this could be remote.   Electronically Signed   By: Conchita Paris M.D.   On: 08/23/2014 14:34   Dg Knee Complete 4 Views Left  08/23/2014   CLINICAL DATA:  LEFT knee pain.  Initial encounter.  EXAM: LEFT KNEE - COMPLETE 4+ VIEW  COMPARISON:  07/24/2011.  FINDINGS: Tricompartmental osteoarthritis of the LEFT knee is present, severe in the medial compartment. Near complete loss of joint space in the medial compartment. No fracture. On the weight-bearing frontal view, there is varus deformity of the knee associated with medial compartment joint space loss. Probable small loose body in the lateral suprapatellar pouch.  IMPRESSION: Severe tricompartmental osteoarthritis of the LEFT knee.   Electronically Signed   By: Dereck Ligas M.D.   On: 08/23/2014 14:35   Dg Hand Complete Left  08/23/2014   CLINICAL DATA:  Fall, left hand pain  EXAM: LEFT HAND - COMPLETE 3+ VIEW  COMPARISON:  None.  FINDINGS: There is no evidence of fracture or dislocation. There is no evidence of arthropathy or other focal bone abnormality. Soft tissues are unremarkable.  IMPRESSION: Negative.   Electronically Signed   By: Conchita Paris M.D.   On: 08/23/2014 14:35     EKG Interpretation None     MDM  Pt placed in an aso.   Pt has a walker at home.   Pt advised to follow up with the Orthopaedist for recheck in1 week   Final diagnoses:  Ankle sprain, right, initial encounter  Knee pain, left anterior  Contusion, hand, left, initial encounter    I personally performed the services in this documentation, which was scribed in my presence.  The recorded information has been reviewed and considered.   Ronnald Collum.  I  personally performed the services in this documentation, which was scribed in my presence.  The recorded information has been reviewed and considered.   Ronnald Collum.    Hollace Kinnier Witmer, PA-C 08/23/14 [place], MD 08/24/14 [phone]

## 2014-11-09 ENCOUNTER — Encounter: Payer: BC Managed Care – PPO | Admitting: Gynecology

## 2014-12-21 ENCOUNTER — Encounter: Payer: Self-pay | Admitting: Gynecology

## 2014-12-21 ENCOUNTER — Ambulatory Visit (INDEPENDENT_AMBULATORY_CARE_PROVIDER_SITE_OTHER): Payer: BC Managed Care – PPO | Admitting: Gynecology

## 2014-12-21 VITALS — BP 140/86 | Ht 60.0 in | Wt 231.0 lb

## 2014-12-21 DIAGNOSIS — M8580 Other specified disorders of bone density and structure, unspecified site: Secondary | ICD-10-CM | POA: Diagnosis not present

## 2014-12-21 DIAGNOSIS — Z853 Personal history of malignant neoplasm of breast: Secondary | ICD-10-CM

## 2014-12-21 DIAGNOSIS — Z01419 Encounter for gynecological examination (general) (routine) without abnormal findings: Secondary | ICD-10-CM

## 2014-12-21 DIAGNOSIS — Z1159 Encounter for screening for other viral diseases: Secondary | ICD-10-CM

## 2014-12-21 LAB — LIPID PANEL
CHOL/HDL RATIO: 3.8 ratio (ref <=5.0)
Cholesterol: 208 mg/dL — ABNORMAL HIGH (ref 125–200)
HDL: 55 mg/dL (ref >=46)
LDL CALC: 136 mg/dL — AB (ref <130)
TRIGLYCERIDES: 83 mg/dL (ref <150)
VLDL: 17 mg/dL (ref <30)

## 2014-12-21 LAB — CBC WITH DIFFERENTIAL/PLATELET
BASOS PCT: 0 % (ref 0–1)
Basophils Absolute: 0 10*3/uL (ref 0.0–0.1)
EOS ABS: 0.3 10*3/uL (ref 0.0–0.7)
Eosinophils Relative: 4 % (ref 0–5)
HCT: 39.5 % (ref 36.0–46.0)
HEMOGLOBIN: 13.1 g/dL (ref 12.0–15.0)
Lymphocytes Relative: 43 % (ref 12–46)
Lymphs Abs: 2.8 10*3/uL (ref 0.7–4.0)
MCH: 27.9 pg (ref 26.0–34.0)
MCHC: 33.2 g/dL (ref 30.0–36.0)
MCV: 84.2 fL (ref 78.0–100.0)
MONO ABS: 0.4 10*3/uL (ref 0.1–1.0)
MONOS PCT: 6 % (ref 3–12)
MPV: 12.2 fL (ref 8.6–12.4)
NEUTROS ABS: 3 10*3/uL (ref 1.7–7.7)
Neutrophils Relative %: 47 % (ref 43–77)
Platelets: 264 10*3/uL (ref 150–400)
RBC: 4.69 MIL/uL (ref 3.87–5.11)
RDW: 13 % (ref 11.5–15.5)
WBC: 6.4 10*3/uL (ref 4.0–10.5)

## 2014-12-21 LAB — COMPREHENSIVE METABOLIC PANEL
ALBUMIN: 4.2 g/dL (ref 3.6–5.1)
ALT: 15 U/L (ref 6–29)
AST: 14 U/L (ref 10–35)
Alkaline Phosphatase: 83 U/L (ref 33–130)
BUN: 11 mg/dL (ref 7–25)
CO2: 30 mmol/L (ref 20–31)
CREATININE: 0.71 mg/dL (ref 0.50–0.99)
Calcium: 9.5 mg/dL (ref 8.6–10.4)
Chloride: 102 mmol/L (ref 98–110)
Glucose, Bld: 147 mg/dL — ABNORMAL HIGH (ref 65–99)
Potassium: 4.1 mmol/L (ref 3.5–5.3)
SODIUM: 143 mmol/L (ref 135–146)
Total Bilirubin: 0.8 mg/dL (ref 0.2–1.2)
Total Protein: 7.3 g/dL (ref 6.1–8.1)

## 2014-12-21 LAB — TSH: TSH: 1.828 u[IU]/mL (ref 0.350–4.500)

## 2014-12-21 NOTE — Progress Notes (Signed)
Tara Summers 08-25-1950 [id number]   History:    64 y.o.  for annual gyn exam with no complaints today. Patient stated that in 2014 she had a colonoscopy at [facility] and was informed that she had some small polyp and this year with a different gastroenterologist she had a negative colonoscopy and was informed she needs to be checked every 10 years? Patient has a past history of TVH with BSO for benign entity several years ago.  Patient has a history of right breast cancer whereby she had a right lumpectomy and is currently being followed by the oncologist Dr. Sondra Come  Her primary physician has been following her for her hypertension as well as for diabetes who has been doing her blood work. Her last bone density was in 2014 in the lowest T score was -1.1 at the [facility]. Patient states that she is taking her calcium and vitamin D. Patient with no prior history of abnormal Pap smear even before her hysterectomy.  Past medical history,surgical history, family history and social history were all reviewed and documented in the EPIC chart.  Gynecologic History No LMP recorded. Patient has had a hysterectomy. Contraception: status post hysterectomy Last Pap: 2011. Results were: normal Last mammogram: 2016. Results were: normal  Obstetric History OB History  Gravida Para Term Preterm AB SAB TAB Ectopic Multiple Living  7 4 3 1 2  1   4     # Outcome Date GA Lbr Len/2nd Weight Sex Delivery Anes PTL Lv  7 AB           6 TAB           5 Term     F CS-Unspec  N Y  4 Preterm     F CS-Unspec  Y Y  3 Term     F Vag-Spont  N Y  2 Term     F Vag-Spont  N Y  1 Gravida             Obstetric Comments  Menarche age 60, Parity, G7, P4, surgical Menopause, No HRT Use     ROS: A ROS was performed and pertinent positives and negatives are included in the history.  GENERAL: No fevers or chills. HEENT: No change in vision, no earache, sore throat or sinus congestion. NECK: No pain or  stiffness. CARDIOVASCULAR: No chest pain or pressure. No palpitations. PULMONARY: No shortness of breath, cough or wheeze. GASTROINTESTINAL: No abdominal pain, nausea, vomiting or diarrhea, melena or bright red blood per rectum. GENITOURINARY: No urinary frequency, urgency, hesitancy or dysuria. MUSCULOSKELETAL: No joint or muscle pain, no back pain, no recent trauma. DERMATOLOGIC: No rash, no itching, no lesions. ENDOCRINE: No polyuria, polydipsia, no heat or cold intolerance. No recent change in weight. HEMATOLOGICAL: No anemia or easy bruising or bleeding. NEUROLOGIC: No headache, seizures, numbness, tingling or weakness. PSYCHIATRIC: No depression, no loss of interest in normal activity or change in sleep pattern.     Exam: chaperone present  BP 140/86 mmHg  Ht 5' (1.524 m)  Wt 231 lb (104.781 kg)  BMI 45.11 kg/m2  Body mass index is 45.11 kg/(m^2).  General appearance : Well developed well nourished female. No acute distress HEENT: Eyes: no retinal hemorrhage or exudates,  Neck supple, trachea midline, no carotid bruits, no thyroidmegaly Lungs: Clear to auscultation, no rhonchi or wheezes, or rib retractions  Heart: Regular rate and rhythm, no murmurs or gallops Breast:Examined in sitting and supine position were symmetrical in  appearance, no palpable masses or tenderness,  no skin retraction, no nipple inversion, no nipple discharge, no skin discoloration, no axillary or supraclavicular lymphadenopathy Abdomen: no palpable masses or tenderness, no rebound or guarding Extremities: no edema or skin discoloration or tenderness  Pelvic:  Bartholin, Urethra, Skene Glands: Within normal limits             Vagina: No gross lesions or discharge  Cervix: Absent  Uterus  absent  Adnexa  Without masses or tenderness  Anus and perineum  normal   Rectovaginal  normal sphincter tone without palpated masses or tenderness             Hemoccult colonoscopy this year     Assessment/Plan:  65  y.o. female for annual exam for bone density study next year. She was reminded importance of calcium vitamin D and regular exercise for osteoporosis prevention. Pap smear not indicated. The following screening blood work was ordered today which she will present to her primary care physician next week: Fasting lipid profile, competence metabolic panel, TSH, CBC, and urinalysis.  New CDC guidelines is recommending patients be tested once in her lifetime for hepatitis C antibody who were born between [birth year] through 1963. This was discussed with the patient today and has agreed to be tested today.   Terrance Mass MD, 12:59 PM 12/21/2014

## 2014-12-22 LAB — URINALYSIS W MICROSCOPIC + REFLEX CULTURE
BACTERIA UA: NONE SEEN [HPF]
Bilirubin Urine: NEGATIVE
CASTS: NONE SEEN [LPF]
Glucose, UA: NEGATIVE
HGB URINE DIPSTICK: NEGATIVE
Ketones, ur: NEGATIVE
Leukocytes, UA: NEGATIVE
NITRITE: NEGATIVE
PROTEIN: NEGATIVE
SQUAMOUS EPITHELIAL / LPF: NONE SEEN [HPF] (ref <=5)
Specific Gravity, Urine: 1.014 (ref 1.001–1.035)
WBC, UA: NONE SEEN WBC/HPF (ref <=5)
Yeast: NONE SEEN [HPF]
pH: 7.5 (ref 5.0–8.0)

## 2014-12-22 LAB — HEPATITIS C ANTIBODY: HCV Ab: NEGATIVE

## 2014-12-23 LAB — URINE CULTURE

## 2015-01-05 ENCOUNTER — Other Ambulatory Visit: Payer: Self-pay | Admitting: Gastroenterology

## 2015-01-05 DIAGNOSIS — R110 Nausea: Secondary | ICD-10-CM

## 2015-01-05 DIAGNOSIS — R1011 Right upper quadrant pain: Secondary | ICD-10-CM

## 2015-01-07 ENCOUNTER — Other Ambulatory Visit: Payer: Self-pay | Admitting: Gastroenterology

## 2015-01-07 DIAGNOSIS — R110 Nausea: Secondary | ICD-10-CM

## 2015-01-25 ENCOUNTER — Encounter (HOSPITAL_COMMUNITY)
Admission: RE | Admit: 2015-01-25 | Discharge: 2015-01-25 | Disposition: A | Discharge status: Home / Self Care | Payer: BC Managed Care – PPO | Source: Ambulatory Visit | Attending: Gastroenterology | Admitting: Gastroenterology

## 2015-01-25 ENCOUNTER — Ambulatory Visit (HOSPITAL_COMMUNITY)
Admission: RE | Admit: 2015-01-25 | Discharge: 2015-01-25 | Disposition: A | Discharge status: Home / Self Care | Payer: BC Managed Care – PPO | Source: Ambulatory Visit | Attending: Gastroenterology | Admitting: Gastroenterology

## 2015-01-25 DIAGNOSIS — R110 Nausea: Secondary | ICD-10-CM | POA: Diagnosis not present

## 2015-01-25 DIAGNOSIS — R938 Abnormal findings on diagnostic imaging of other specified body structures: Secondary | ICD-10-CM | POA: Diagnosis not present

## 2015-01-25 DIAGNOSIS — R1011 Right upper quadrant pain: Secondary | ICD-10-CM | POA: Insufficient documentation

## 2015-01-25 MED ORDER — SINCALIDE 5 MCG IJ SOLR
0.0200 ug/kg | Freq: Once | INTRAMUSCULAR | Status: AC
  Administered 2015-01-25: 2.1 ug via INTRAVENOUS

## 2015-01-25 MED ORDER — TECHNETIUM TC 99M MEBROFENIN IV KIT
5.3700 | PACK | Freq: Once | INTRAVENOUS | Status: DC | PRN
  Administered 2015-01-25: 5 via INTRAVENOUS
  Filled 2015-01-25: qty 6

## 2015-02-02 ENCOUNTER — Other Ambulatory Visit: Payer: Self-pay | Admitting: Surgery

## 2015-03-08 ENCOUNTER — Encounter (HOSPITAL_COMMUNITY): Payer: Self-pay | Admitting: Emergency Medicine

## 2015-03-08 ENCOUNTER — Encounter (HOSPITAL_COMMUNITY)
Admission: RE | Admit: 2015-03-08 | Discharge: 2015-03-08 | Disposition: A | Discharge status: Home / Self Care | Payer: BC Managed Care – PPO | Source: Ambulatory Visit | Attending: Surgery | Admitting: Surgery

## 2015-03-08 ENCOUNTER — Encounter (HOSPITAL_COMMUNITY): Payer: Self-pay

## 2015-03-08 DIAGNOSIS — Z01818 Encounter for other preprocedural examination: Secondary | ICD-10-CM | POA: Insufficient documentation

## 2015-03-08 HISTORY — DX: Vitamin D deficiency, unspecified: E55.9

## 2015-03-08 HISTORY — DX: Urgency of urination: R39.15

## 2015-03-08 HISTORY — DX: Pain in unspecified joint: M25.50

## 2015-03-08 HISTORY — DX: Personal history of other diseases of the digestive system: Z87.19

## 2015-03-08 HISTORY — DX: Nocturia: R35.1

## 2015-03-08 HISTORY — DX: Gastro-esophageal reflux disease without esophagitis: K21.9

## 2015-03-08 HISTORY — DX: Personal history of other diseases of the respiratory system: Z87.09

## 2015-03-08 HISTORY — DX: Diverticulosis of intestine, part unspecified, without perforation or abscess without bleeding: K57.90

## 2015-03-08 HISTORY — DX: Pneumonia, unspecified organism: J18.9

## 2015-03-08 HISTORY — DX: Constipation, unspecified: K59.00

## 2015-03-08 HISTORY — DX: Unspecified osteoarthritis, unspecified site: M19.90

## 2015-03-08 HISTORY — DX: Personal history of other medical treatment: Z92.89

## 2015-03-08 HISTORY — DX: Personal history of other infectious and parasitic diseases: Z86.19

## 2015-03-08 HISTORY — DX: Frequency of micturition: R35.0

## 2015-03-08 HISTORY — DX: Effusion, unspecified joint: M25.40

## 2015-03-08 LAB — BASIC METABOLIC PANEL
Anion gap: 9 (ref 5–15)
BUN: 11 mg/dL (ref 6–20)
CALCIUM: 9.3 mg/dL (ref 8.9–10.3)
CO2: 28 mmol/L (ref 22–32)
Chloride: 102 mmol/L (ref 101–111)
Creatinine, Ser: 0.83 mg/dL (ref 0.44–1.00)
GFR calc Af Amer: >60 mL/min (ref >60)
GLUCOSE: 169 mg/dL — AB (ref 65–99)
POTASSIUM: 3.3 mmol/L — AB (ref 3.5–5.1)
SODIUM: 139 mmol/L (ref 135–145)

## 2015-03-08 LAB — GLUCOSE, CAPILLARY: GLUCOSE-CAPILLARY: 150 mg/dL — AB (ref 65–99)

## 2015-03-08 LAB — CBC
HCT: 40.2 % (ref 36.0–46.0)
Hemoglobin: 13.3 g/dL (ref 12.0–15.0)
MCH: 27.9 pg (ref 26.0–34.0)
MCHC: 33.1 g/dL (ref 30.0–36.0)
MCV: 84.5 fL (ref 78.0–100.0)
PLATELETS: 251 10*3/uL (ref 150–400)
RBC: 4.76 MIL/uL (ref 3.87–5.11)
RDW: 12.4 % (ref 11.5–15.5)
WBC: 5.9 10*3/uL (ref 4.0–10.5)

## 2015-03-08 NOTE — Progress Notes (Signed)
Anesthesia Chart Review:  Pt is 64 year old female scheduled for laparoscopic cholecystectomy on 03/10/2015 with Dr. Evlyn Courier.   PMH includes: HTN, OSA, DM, GERD, breast cancer. Never smoker. BMI 43.   Medications include: amlodipine, ASA, metformin, olmesartan, phentermine. Last dose phentermine 03/07/15.   Preoperative labs reviewed. Glucose 169, hgbA1c pending.   EKG 03/08/15: NSR. Possible Anterior infarct, age undetermined  Reviewed case with Dr. Kalman Shan. Pt will need to be off of phentermine 2 weeks prior to elective surgery. Notified Tandy, triage RN, in Dr. Trevor Mace office.   Willeen Cass, FNP-BC [facility]/Anesthesiology Phone: [phone] 03/08/2015 4:51 PM

## 2015-03-08 NOTE — Progress Notes (Addendum)
Medical Md is Dr.Betti Terisa Starr Dr.Harwani in 2005 with visit in epic  Echo denies ever having one  Stress test report in epic from 2005  Heart cath denies ever having one  Sleep study in epic from 2005  EKG denies having in past yr  CXR denies having in past yr

## 2015-03-08 NOTE — Pre-Procedure Instructions (Signed)
Tara Summers  03/08/2015      [id number] - [place] [place] [address] [place] [address] Phone: [phone] Fax: [phone]    Your procedure is scheduled on Wed, Nov 9 @ 1:00 PM  Report to [facility] Admitting at 11:00 AM  Call this number if you have problems the morning of surgery:  [phone]   Remember:  Do not eat food or drink liquids after midnight.  Take these medicines the morning of surgery with A SIP OF WATER Amlodipine(Norvasc)              Stop taking your Aspirin and Phentermine. No Goody's,BC's,Aleve,Ibuprofen,Motrin,Advil,Fish Oil,or any Herbal Medications.              How to Manage Your Diabetes Before Surgery   Why is it important to control my blood sugar before and after surgery?   Improving blood sugar levels before and after surgery helps healing and can limit problems.  A way of improving blood sugar control is eating a healthy diet by:  - Eating less sugar and carbohydrates  - Increasing activity/exercise  - Talk with your doctor about reaching your blood sugar goals  High blood sugars (greater than 180 mg/dL) can raise your risk of infections and slow down your recovery so you will need to focus on controlling your diabetes during the weeks before surgery.  Make sure that the doctor who takes care of your diabetes knows about your planned surgery including the date and location.  How do I manage my blood sugars before surgery?   Check your blood sugar at least 4 times a day, 2 days before surgery to make sure that they are not too high or low.   Check your blood sugar the morning of your surgery when you wake up and every 2               hours until you get to the Short-Stay unit.  If your blood sugar is less than 70 mg/dL, you will need to treat for low blood sugar by:  Treat a low blood sugar (less than 70 mg/dL) with 1/2 cup of clear  juice (cranberry or apple), 4 glucose tablets, OR glucose gel.  Recheck blood sugar in 15 minutes after treatment (to make sure it is greater than 70 mg/dL).  If blood sugar is not greater than 70 mg/dL on re-check, call [phone] for further instructions.   Report your blood sugar to the Short-Stay nurse when you get to Short-Stay.  References:  [facility], 2007 "How to Manage your Diabetes Before and After Surgery".  What do I do about my diabetes medications?   Do not take oral diabetes medicines (pills) the morning of surgery.          Do not take other diabetes injectables the day of surgery including Byetta, Victoza, Bydureon, and Trulicity.    If your CBG is greater than 220 mg/dL, you may take 1/2 of your sliding scale (correction) dose of insulin.   For patients with "Insulin Pumps":  Contact your diabetes doctor for specific instructions before surgery.   Decrease basal insulin rates by 20% at midnight the night before surgery.  Note that if your surgery is planned to be longer than 2 hours, your insulin pump will be removed and intravenous (IV) insulin will be started and managed by the nurses and anesthesiologist.  You will be able to restart your insulin pump once you are awake and able to manage it.  Make sure to bring insulin pump supplies to the hospital with you in case your site needs to be changed.         Do not wear jewelry, make-up or nail polish.  Do not wear lotions, powders, or perfumes.  You may wear deodorant.  Do not shave 48 hours prior to surgery.  Men may shave face and neck.  Do not bring valuables to the hospital.  [facility] is not responsible for any belongings or valuables.  Contacts, dentures or bridgework may not be worn into surgery.  Leave your suitcase in the car.  After surgery it may be brought to your room.  For patients admitted to the hospital, discharge time will be determined by your  treatment team.  Patients discharged the day of surgery will not be allowed to drive home.    Special instructions:   [place] - Preparing for Surgery  Before surgery, you can play an important role.  Because skin is not sterile, your skin needs to be as free of germs as possible.  You can reduce the number of germs on you skin by washing with CHG (chlorahexidine gluconate) soap before surgery.  CHG is an antiseptic cleaner which kills germs and bonds with the skin to continue killing germs even after washing.  Please DO NOT use if you have an allergy to CHG or antibacterial soaps.  If your skin becomes reddened/irritated stop using the CHG and inform your nurse when you arrive at Short Stay.  Do not shave (including legs and underarms) for at least 48 hours prior to the first CHG shower.  You may shave your face.  Please follow these instructions carefully:   1.  Shower with CHG Soap the night before surgery and the                                morning of Surgery.  2.  If you choose to wash your hair, wash your hair first as usual with your       normal shampoo.  3.  After you shampoo, rinse your hair and body thoroughly to remove the                      Shampoo.  4.  Use CHG as you would any other liquid soap.  You can apply chg directly       to the skin and wash gently with scrungie or a clean washcloth.  5.  Apply the CHG Soap to your body ONLY FROM THE NECK DOWN.        Do not use on open wounds or open sores.  Avoid contact with your eyes,       ears, mouth and genitals (private parts).  Wash genitals (private parts)       with your normal soap.  6.  Wash thoroughly, paying special attention to the area where your surgery        will be performed.  7.  Thoroughly rinse your body with warm water from the neck down.  8.  DO NOT shower/wash with your normal soap after using and rinsing off       the CHG Soap.  9.  Pat yourself dry with a clean towel.  10.  Wear clean  pajamas.            11.  Place clean sheets on your bed the night of your first shower and do not        sleep with pets.  Day of Surgery  Do not apply any lotions/deoderants the morning of surgery.  Please wear clean clothes to the hospital/surgery center.   Please read over the following fact sheets that you were given. Pain Booklet, Coughing and Deep Breathing and Surgical Site Infection Prevention

## 2015-03-09 LAB — HEMOGLOBIN A1C
Hgb A1c MFr Bld: 6.9 % — ABNORMAL HIGH (ref 4.8–5.6)
Mean Plasma Glucose: 151 mg/dL

## 2015-03-09 NOTE — H&P (Signed)
Tara Summers [facility]  Location: [facility] Surgery Patient #: [id number] DOB: 10/09/1950 Married / Language: English / Race: Black or African American Female   History of Present Illness (Areana Kosanke A. Ninfa Linden MD; Patient words: gallbladder.  The patient is a 64 year old female who presents with abdominal pain. This is a very pleasant female referred to me by Dr. Juanita Craver for biliary dyskinesia. She has been having right upper quadrant abdominal pain with nausea and vomiting for many years. This occurs always after fatty meals. She has had an ultrasound showing a possible gallbladder polyp. She has had a HIDA scan showing a decreased gallbladder ejection fraction with symptoms reproduced with CCK administration. Bowel movements are normal. Endoscopy has been unremarkable. She has had no relief with antacid medications. She is otherwise without complaints.   Other Problems Davy Pique Bynum, CMA;  Bladder Problems Diabetes Mellitus Gastroesophageal Reflux Disease High blood pressure Oophorectomy Bilateral. Sleep Apnea  Past Surgical History Davy Pique Bynum, CMA;  Breast Mass; Local Excision Right. Cesarean Section - 1 Spinal Surgery - Lower Back  Diagnostic Studies History Davy Pique Bynum, CMA;  Colonoscopy within last year Mammogram 1-3 years ago  Allergies Marjean Donna, CMA;  No Known Drug Allergies10/07/2014  Medication History (Sonya Bynum, CMA;  AmLODIPine Besylate (5MG  Tablet, Oral) Active. Benicar HCT (40-25MG  Tablet, Oral) Active. MetFORMIN HCl (500MG  Tablet, Oral) Active. Aspirin (81MG  Tablet DR, Oral as needed) Active. Hydrocodone-Acetaminophen (5-325MG  Tablet, Oral as needed) Active. Medications Reconciled  Pregnancy / Birth History (Sonya Bynum, CMA;  Gravida 7 Para 4    Review of Systems ([place];  General Not Present- Appetite Loss, Chills, Fatigue, Fever, Night Sweats, Weight Gain and Weight Loss. Skin Not Present- Change in  Wart/Mole, Dryness, Hives, Jaundice, New Lesions, Non-Healing Wounds, Rash and Ulcer. HEENT Present- Seasonal Allergies. Not Present- Earache, Hearing Loss, Hoarseness, Nose Bleed, Oral Ulcers, Ringing in the Ears, Sinus Pain, Sore Throat, Visual Disturbances, Wears glasses/contact lenses and Yellow Eyes. Respiratory Not Present- Bloody sputum, Chronic Cough, Difficulty Breathing, Snoring and Wheezing. Breast Not Present- Breast Mass, Breast Pain, Nipple Discharge and Skin Changes. Cardiovascular Present- Swelling of Extremities. Not Present- Chest Pain, Difficulty Breathing Lying Down, Leg Cramps, Palpitations, Rapid Heart Rate and Shortness of Breath. Gastrointestinal Present- Abdominal Pain and Rectal Pain. Not Present- Bloating, Bloody Stool, Change in Bowel Habits, Chronic diarrhea, Constipation, Difficulty Swallowing, Excessive gas, Gets full quickly at meals, Hemorrhoids, Indigestion, Nausea and Vomiting. Neurological Not Present- Decreased Memory, Fainting, Headaches, Numbness, Seizures, Tingling, Tremor, Trouble walking and Weakness. Psychiatric Not Present- Anxiety, Bipolar, Change in Sleep Pattern, Depression, Fearful and Frequent crying. Hematology Not Present- Easy Bruising, Excessive bleeding, Gland problems, HIV and Persistent Infections.  Vitals (Sonya Bynum CMA;   Weight: 236.6 lb Height: 60in Body Surface Area: 2.13 m Body Mass Index: 46.21 kg/m  Temp.: 97.31F(Temporal)  Pulse: 79 (Regular)  BP: 136/80 (Sitting, Left Arm, Standard)     Physical Exam (Cire Clute A. Ninfa Linden MD;  General Mental Status-Alert. General Appearance-Consistent with stated age. Hydration-Well hydrated. Voice-Normal.  Head and Neck Head-normocephalic, atraumatic with no lesions or palpable masses.  Eye Eyeball - Bilateral-Extraocular movements intact. Sclera/Conjunctiva - Bilateral-No scleral icterus.  Chest and Lung Exam Chest and lung exam reveals -quiet, even  and easy respiratory effort with no use of accessory muscles and on auscultation, normal breath sounds, no adventitious sounds and normal vocal resonance. Inspection Chest Wall - Normal. Back - normal.  Cardiovascular Cardiovascular examination reveals -on palpation PMI is normal in location and amplitude, no palpable S3  or S4. Normal cardiac borders., normal heart sounds, regular rate and rhythm with no murmurs, carotid auscultation reveals no bruits and normal pedal pulses bilaterally.  Abdomen Inspection Inspection of the abdomen reveals - No Hernias. Skin - Scar - no surgical scars. Palpation/Percussion Palpation and Percussion of the abdomen reveal - Soft, No Rebound tenderness, No Rigidity (guarding) and No hepatosplenomegaly. Tenderness - Right Upper Quadrant. Auscultation Auscultation of the abdomen reveals - Bowel sounds normal.  Neurologic Neurologic evaluation reveals -alert and oriented x 3 with no impairment of recent or remote memory. Mental Status-Normal.  Musculoskeletal Normal Exam - Left-Upper Extremity Strength Normal and Lower Extremity Strength Normal. Normal Exam - Right-Upper Extremity Strength Normal, Lower Extremity Weakness.    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 I believe she probably has chronic cholecystitis as well as a possible small stone rather than a polyp. I gave her literature regarding surgery. I discussed the risk of surgery in detail. These risks include but are not limited to bleeding, infection, bile duct injury, bile leak, injury to other structures, the need to convert to an open procedure, postoperative recovery, DVT, cardiopulmonary issues, etc. She understands and wishes to proceed with surgery

## 2015-03-10 ENCOUNTER — Encounter (HOSPITAL_COMMUNITY): Admission: RE | Payer: Self-pay | Source: Ambulatory Visit

## 2015-03-10 ENCOUNTER — Ambulatory Visit (HOSPITAL_COMMUNITY): Admission: RE | Admit: 2015-03-10 | Payer: BC Managed Care – PPO | Source: Ambulatory Visit | Admitting: Surgery

## 2015-03-10 SURGERY — LAPAROSCOPIC CHOLECYSTECTOMY
Anesthesia: General

## 2015-03-19 ENCOUNTER — Ambulatory Visit: Payer: Self-pay | Admitting: Physician Assistant
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
## 2015-03-23 ENCOUNTER — Inpatient Hospital Stay (HOSPITAL_COMMUNITY)
Admission: RE | Admit: 2015-03-23 | Discharge: 2015-03-23 | Disposition: A | Discharge status: Home / Self Care | Payer: BC Managed Care – PPO | Source: Ambulatory Visit

## 2015-04-02 ENCOUNTER — Inpatient Hospital Stay (HOSPITAL_COMMUNITY)
Admission: RE | Admit: 2015-04-02 | Payer: BC Managed Care – PPO | Source: Ambulatory Visit | Admitting: Orthopedic Surgery

## 2015-04-02 ENCOUNTER — Encounter (HOSPITAL_COMMUNITY): Admission: RE | Payer: Self-pay | Source: Ambulatory Visit

## 2015-04-02 SURGERY — ARTHROPLASTY, KNEE, TOTAL
Anesthesia: General | Laterality: Left

## 2015-06-11 ENCOUNTER — Encounter (HOSPITAL_COMMUNITY)
Admission: RE | Admit: 2015-06-11 | Discharge: 2015-06-11 | Disposition: A | Discharge status: Home / Self Care | Payer: BC Managed Care – PPO | Source: Ambulatory Visit | Attending: Surgery | Admitting: Surgery

## 2015-06-11 ENCOUNTER — Encounter (HOSPITAL_COMMUNITY): Payer: Self-pay

## 2015-06-11 DIAGNOSIS — M1712 Unilateral primary osteoarthritis, left knee: Secondary | ICD-10-CM | POA: Insufficient documentation

## 2015-06-11 DIAGNOSIS — Z01812 Encounter for preprocedural laboratory examination: Secondary | ICD-10-CM | POA: Insufficient documentation

## 2015-06-11 LAB — BASIC METABOLIC PANEL
ANION GAP: 11 (ref 5–15)
BUN: 15 mg/dL (ref 6–20)
CHLORIDE: 98 mmol/L — AB (ref 101–111)
CO2: 33 mmol/L — AB (ref 22–32)
CREATININE: 0.96 mg/dL (ref 0.44–1.00)
Calcium: 9.9 mg/dL (ref 8.9–10.3)
GFR calc non Af Amer: >60 mL/min (ref >60)
Glucose, Bld: 225 mg/dL — ABNORMAL HIGH (ref 65–99)
Potassium: 3.3 mmol/L — ABNORMAL LOW (ref 3.5–5.1)
Sodium: 142 mmol/L (ref 135–145)

## 2015-06-11 LAB — CBC
HEMATOCRIT: 40.9 % (ref 36.0–46.0)
HEMOGLOBIN: 13.3 g/dL (ref 12.0–15.0)
MCH: 27.1 pg (ref 26.0–34.0)
MCHC: 32.5 g/dL (ref 30.0–36.0)
MCV: 83.3 fL (ref 78.0–100.0)
Platelets: 302 10*3/uL (ref 150–400)
RBC: 4.91 MIL/uL (ref 3.87–5.11)
RDW: 12.9 % (ref 11.5–15.5)
WBC: 7.5 10*3/uL (ref 4.0–10.5)

## 2015-06-11 LAB — GLUCOSE, CAPILLARY: GLUCOSE-CAPILLARY: 188 mg/dL — AB (ref 65–99)

## 2015-06-11 MED ORDER — SODIUM CHLORIDE 0.9 % IV SOLN: INTRAVENOUS | Status: DC

## 2015-06-11 MED ORDER — CEFAZOLIN SODIUM-DEXTROSE 2-3 GM-% IV SOLR: 2.0000 g | INTRAVENOUS | Status: DC

## 2015-06-11 MED ORDER — CHLORHEXIDINE GLUCONATE 4 % EX LIQD: 60.0000 mL | Freq: Once | CUTANEOUS | Status: DC

## 2015-06-11 MED ORDER — TRANEXAMIC ACID 1000 MG/10ML IV SOLN: 1000.0000 mg | INTRAVENOUS | Status: DC

## 2015-06-11 NOTE — Pre-Procedure Instructions (Signed)
    Tara Summers  06/11/2015     Your procedure is scheduled on Thursday, February 16.  Report to [facility] Admitting at 12:00 NOON  Call this number if you have problems the morning of surgery: [phone]   Remember:  Do not eat food or drink liquids after midnight Wednesday, February 15.  Take these medicines the morning of surgery with A SIP OF WATER :amLODipine (NORVASC).                  STOP taking phentermine.    Do not wear jewelry, make-up or nail polish.  Do not wear lotions, powders, or perfumes.    Do not shave 48 hours prior to surgery.    Do not bring valuables to the hospital.  [facility] is not responsible for any belongings or valuables.  Contacts, dentures or bridgework may not be worn into surgery.  Leave your suitcase in the car.  After surgery it may be brought to your room.  For patients admitted to the hospital, discharge time will be determined by your treatment team.  Patients discharged the day of surgery will not be allowed to drive home.   Special instructions: Review  [facility] - Preparing For Surgery.  Please read over the following fact sheets that you were given. Pain Booklet, Coughing and Deep Breathing and Surgical Site Infection Prevention

## 2015-06-11 NOTE — Progress Notes (Signed)
I called Tara Summers at [facility] and asked for orders for OR.

## 2015-06-11 NOTE — Progress Notes (Signed)
How to Manage Your Diabetes Before Surgery   Why is it important to control my blood sugar before and after surgery?   Improving blood sugar levels before and after surgery helps healing and can limit problems.  A way of improving blood sugar control is eating a healthy diet by:  - Eating less sugar and carbohydrates  - Increasing activity/exercise  - Talk with your doctor about reaching your blood sugar goals  High blood sugars (greater than 180 mg/dL) can raise your risk of infections and slow down your recovery so you will need to focus on controlling your diabetes during the weeks before surgery.  Make sure that the doctor who takes care of your diabetes knows about your planned surgery including the date and location.  How do I manage my blood sugars before surgery?   Check your blood sugar at least 4 times a day, 2 days before surgery to make sure that they are not too high or low.   Check your blood sugar the morning of your surgery when you wake up and every 2 hours until you get to the Short-Stay unit.  If your blood sugar is less than 70 mg/dL, you will need to treat for low blood sugar by:  Treat a low blood sugar (less than 70 mg/dL) with 1/2 cup of clear juice (cranberry or apple), 4 glucose tablets, OR glucose gel.  Recheck blood sugar in 15 minutes after treatment (to make sure it is greater than 70 mg/dL).  If blood sugar is not greater than 70 mg/dL on re-check, call [phone] for further instructions.   Report your blood sugar to the Short-Stay nurse when you get to Short-Stay.  References:  [facility], 2007 "How to Manage your Diabetes Before and After Surgery".  What do I do about my diabetes medications?   Do not take oral diabetes medicines (pills) the morning of surgery.

## 2015-06-12 ENCOUNTER — Other Ambulatory Visit: Payer: Self-pay | Admitting: Surgery

## 2015-06-12 LAB — HEMOGLOBIN A1C
Hgb A1c MFr Bld: 6.9 % — ABNORMAL HIGH (ref 4.8–5.6)
Mean Plasma Glucose: 151 mg/dL

## 2015-06-16 MED ORDER — CEFAZOLIN SODIUM-DEXTROSE 2-3 GM-% IV SOLR
2.0000 g | INTRAVENOUS | Status: AC
  Administered 2015-06-17: 2 g via INTRAVENOUS
  Filled 2015-06-16: qty 50

## 2015-06-16 NOTE — H&P (Signed)
Tara Summers [facility]  Location: [facility] Patient #: [id number] DOB: Feb 02, 1951 Married / Language: English / Race: Black or African American Female   History of Present Illness (Caid Radin A. Ninfa Linden MD Patient words: gallbladder.  The patient is a 65 year old female who presents with abdominal pain. This is a very pleasant female referred to me by Dr. Juanita Craver for biliary dyskinesia. She has been having right upper quadrant abdominal pain with nausea and vomiting for many years. This occurs always after fatty meals. She has had an ultrasound showing a possible gallbladder polyp. She has had a HIDA scan showing a decreased gallbladder ejection fraction with symptoms reproduced with CCK administration. Bowel movements are normal. Endoscopy has been unremarkable. She has had no relief with antacid medications. She is otherwise without complaints.   Other Problems Davy Pique Bynum, CMA; Bladder Problems Diabetes Mellitus Gastroesophageal Reflux Disease High blood pressure Oophorectomy Bilateral. Sleep Apnea  Past Surgical History Davy Pique Bynum, CMA;  Breast Mass; Local Excision Right. Cesarean Section - 1 Spinal Surgery - Lower Back  Diagnostic Studies History Davy Pique Bynum, CMA; Colonoscopy within last year Mammogram 1-3 years ago  Allergies Marjean Donna, CMA; No Known Drug Allergies10/07/2014  Medication History (Sonya Bynum, CMA;  AmLODIPine Besylate (5MG  Tablet, Oral) Active. Benicar HCT (40-25MG  Tablet, Oral) Active. MetFORMIN HCl (500MG  Tablet, Oral) Active. Aspirin (81MG  Tablet DR, Oral as needed) Active. Hydrocodone-Acetaminophen (5-325MG  Tablet, Oral as needed) Active. Medications Reconciled  Pregnancy / Birth History (Sonya Bynum, CMA; Gravida 7 Para 4    Review of Systems ([place];  General Not Present- Appetite Loss, Chills, Fatigue, Fever, Night Sweats, Weight Gain and Weight Loss. Skin Not Present- Change in  Wart/Mole, Dryness, Hives, Jaundice, New Lesions, Non-Healing Wounds, Rash and Ulcer. HEENT Present- Seasonal Allergies. Not Present- Earache, Hearing Loss, Hoarseness, Nose Bleed, Oral Ulcers, Ringing in the Ears, Sinus Pain, Sore Throat, Visual Disturbances, Wears glasses/contact lenses and Yellow Eyes. Respiratory Not Present- Bloody sputum, Chronic Cough, Difficulty Breathing, Snoring and Wheezing. Breast Not Present- Breast Mass, Breast Pain, Nipple Discharge and Skin Changes. Cardiovascular Present- Swelling of Extremities. Not Present- Chest Pain, Difficulty Breathing Lying Down, Leg Cramps, Palpitations, Rapid Heart Rate and Shortness of Breath. Gastrointestinal Present- Abdominal Pain and Rectal Pain. Not Present- Bloating, Bloody Stool, Change in Bowel Habits, Chronic diarrhea, Constipation, Difficulty Swallowing, Excessive gas, Gets full quickly at meals, Hemorrhoids, Indigestion, Nausea and Vomiting. Neurological Not Present- Decreased Memory, Fainting, Headaches, Numbness, Seizures, Tingling, Tremor, Trouble walking and Weakness. Psychiatric Not Present- Anxiety, Bipolar, Change in Sleep Pattern, Depression, Fearful and Frequent crying. Hematology Not Present- Easy Bruising, Excessive bleeding, Gland problems, HIV and Persistent Infections.  Vitals   Weight: 236.6 lb Height: 60in Body Surface Area: 2.13 m Body Mass Index: 46.21 kg/m  Temp.: 97.66F(Temporal)  Pulse: 79 (Regular)  BP: 136/80 (Sitting, Left Arm, Standard)   Physical Exam General Mental Status-Alert. General Appearance-Consistent with stated age. Hydration-Well hydrated. Voice-Normal.  Head and Neck Head-normocephalic, atraumatic with no lesions or palpable masses.  Eye Eyeball - Bilateral-Extraocular movements intact. Sclera/Conjunctiva - Bilateral-No scleral icterus.  Chest and Lung Exam Chest and lung exam reveals -quiet, even and easy respiratory effort with no use of  accessory muscles and on auscultation, normal breath sounds, no adventitious sounds and normal vocal resonance. Inspection Chest Wall - Normal. Back - normal.  Cardiovascular Cardiovascular examination reveals -on palpation PMI is normal in location and amplitude, no palpable S3 or S4. Normal cardiac borders., normal heart sounds, regular rate and rhythm with no  murmurs, carotid auscultation reveals no bruits and normal pedal pulses bilaterally.  Abdomen Inspection Inspection of the abdomen reveals - No Hernias. Skin - Scar - no surgical scars. Palpation/Percussion Palpation and Percussion of the abdomen reveal - Soft, No Rebound tenderness, No Rigidity (guarding) and No hepatosplenomegaly. Tenderness - Right Upper Quadrant. Auscultation Auscultation of the abdomen reveals - Bowel sounds normal.  Neurologic Neurologic evaluation reveals -alert and oriented x 3 with no impairment of recent or remote memory. Mental Status-Normal.  Musculoskeletal Normal Exam - Left-Upper Extremity Strength Normal and Lower Extremity Strength Normal. Normal Exam - Right-Upper Extremity Strength Normal, Lower Extremity Weakness.    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 I believe she probably has chronic cholecystitis as well as a possible small stone rather than a polyp. I gave her literature regarding surgery. I discussed the risk of surgery in detail. These risks include but are not limited to bleeding, infection, bile duct injury, bile leak, injury to other structures, the need to convert to an open procedure, postoperative recovery, DVT, cardiopulmonary issues, etc. She understands and wishes to proceed with surgery

## 2015-06-17 ENCOUNTER — Encounter (HOSPITAL_COMMUNITY)
Admission: RE | Disposition: A | Discharge status: Home / Self Care | Payer: Self-pay | Source: Ambulatory Visit | Attending: Surgery

## 2015-06-17 ENCOUNTER — Ambulatory Visit (HOSPITAL_COMMUNITY): Payer: BC Managed Care – PPO | Admitting: Anesthesiology

## 2015-06-17 ENCOUNTER — Encounter (HOSPITAL_COMMUNITY): Payer: Self-pay | Admitting: Anesthesiology

## 2015-06-17 ENCOUNTER — Observation Stay (HOSPITAL_COMMUNITY)
Admission: RE | Admit: 2015-06-17 | Discharge: 2015-06-18 | Disposition: A | Discharge status: Home / Self Care | Payer: BC Managed Care – PPO | Source: Ambulatory Visit | Attending: Surgery | Admitting: Surgery

## 2015-06-17 DIAGNOSIS — K219 Gastro-esophageal reflux disease without esophagitis: Secondary | ICD-10-CM | POA: Diagnosis not present

## 2015-06-17 DIAGNOSIS — Z6841 Body Mass Index (BMI) 40.0 and over, adult: Secondary | ICD-10-CM | POA: Diagnosis not present

## 2015-06-17 DIAGNOSIS — Z9989 Dependence on other enabling machines and devices: Secondary | ICD-10-CM | POA: Insufficient documentation

## 2015-06-17 DIAGNOSIS — K819 Cholecystitis, unspecified: Principal | ICD-10-CM | POA: Insufficient documentation

## 2015-06-17 DIAGNOSIS — K449 Diaphragmatic hernia without obstruction or gangrene: Secondary | ICD-10-CM | POA: Insufficient documentation

## 2015-06-17 DIAGNOSIS — R109 Unspecified abdominal pain: Secondary | ICD-10-CM | POA: Diagnosis present

## 2015-06-17 DIAGNOSIS — Z79891 Long term (current) use of opiate analgesic: Secondary | ICD-10-CM | POA: Insufficient documentation

## 2015-06-17 DIAGNOSIS — I10 Essential (primary) hypertension: Secondary | ICD-10-CM | POA: Insufficient documentation

## 2015-06-17 DIAGNOSIS — M1990 Unspecified osteoarthritis, unspecified site: Secondary | ICD-10-CM | POA: Insufficient documentation

## 2015-06-17 DIAGNOSIS — Z419 Encounter for procedure for purposes other than remedying health state, unspecified: Secondary | ICD-10-CM

## 2015-06-17 DIAGNOSIS — Z87891 Personal history of nicotine dependence: Secondary | ICD-10-CM | POA: Insufficient documentation

## 2015-06-17 DIAGNOSIS — Z7982 Long term (current) use of aspirin: Secondary | ICD-10-CM | POA: Diagnosis not present

## 2015-06-17 DIAGNOSIS — G4730 Sleep apnea, unspecified: Secondary | ICD-10-CM | POA: Diagnosis not present

## 2015-06-17 DIAGNOSIS — Z7984 Long term (current) use of oral hypoglycemic drugs: Secondary | ICD-10-CM | POA: Diagnosis not present

## 2015-06-17 DIAGNOSIS — Z23 Encounter for immunization: Secondary | ICD-10-CM | POA: Insufficient documentation

## 2015-06-17 DIAGNOSIS — G709 Myoneural disorder, unspecified: Secondary | ICD-10-CM | POA: Insufficient documentation

## 2015-06-17 DIAGNOSIS — E119 Type 2 diabetes mellitus without complications: Secondary | ICD-10-CM | POA: Diagnosis not present

## 2015-06-17 DIAGNOSIS — Z79899 Other long term (current) drug therapy: Secondary | ICD-10-CM | POA: Diagnosis not present

## 2015-06-17 HISTORY — PX: CHOLECYSTECTOMY: SHX55

## 2015-06-17 LAB — GLUCOSE, CAPILLARY
Glucose-Capillary: 157 mg/dL — ABNORMAL HIGH (ref 65–99)
Glucose-Capillary: 162 mg/dL — ABNORMAL HIGH (ref 65–99)

## 2015-06-17 SURGERY — LAPAROSCOPIC CHOLECYSTECTOMY
Anesthesia: General | Site: Abdomen

## 2015-06-17 MED ORDER — SODIUM CHLORIDE 0.9 % IR SOLN
Status: DC | PRN
  Administered 2015-06-17: 1000 mL

## 2015-06-17 MED ORDER — LIDOCAINE HCL (CARDIAC) 20 MG/ML IV SOLN
INTRAVENOUS | Status: AC
  Filled 2015-06-17: qty 5

## 2015-06-17 MED ORDER — SODIUM CHLORIDE 0.9% FLUSH: 3.0000 mL | INTRAVENOUS | Status: DC | PRN

## 2015-06-17 MED ORDER — BUPIVACAINE-EPINEPHRINE 0.25% -1:200000 IJ SOLN
INTRAMUSCULAR | Status: DC | PRN
  Administered 2015-06-17: 20 mL

## 2015-06-17 MED ORDER — SODIUM CHLORIDE 0.9 % IV SOLN: 250.0000 mL | INTRAVENOUS | Status: DC | PRN

## 2015-06-17 MED ORDER — FENTANYL CITRATE (PF) 100 MCG/2ML IJ SOLN
INTRAMUSCULAR | Status: DC | PRN
Start: 2015-06-17 — End: 2015-06-17
  Administered 2015-06-17: 50 ug via INTRAVENOUS
  Administered 2015-06-17: 100 ug via INTRAVENOUS
  Administered 2015-06-17 (×2): 50 ug via INTRAVENOUS

## 2015-06-17 MED ORDER — OXYCODONE HCL 5 MG PO TABS
5.0000 mg | ORAL_TABLET | Freq: Once | ORAL | Status: AC | PRN
  Administered 2015-06-17: 5 mg via ORAL

## 2015-06-17 MED ORDER — GLYCOPYRROLATE 0.2 MG/ML IJ SOLN
INTRAMUSCULAR | Status: AC
  Filled 2015-06-17: qty 3

## 2015-06-17 MED ORDER — ACETAMINOPHEN 325 MG PO TABS: 650.0000 mg | ORAL_TABLET | ORAL | Status: DC | PRN

## 2015-06-17 MED ORDER — MORPHINE SULFATE (PF) 2 MG/ML IV SOLN
1.0000 mg | INTRAVENOUS | Status: DC | PRN
  Administered 2015-06-17: 2 mg via INTRAVENOUS
  Administered 2015-06-17: 4 mg via INTRAVENOUS
  Administered 2015-06-17: 2 mg via INTRAVENOUS
  Administered 2015-06-18: 4 mg via INTRAVENOUS
  Filled 2015-06-17: qty 1
  Filled 2015-06-17 (×2): qty 2

## 2015-06-17 MED ORDER — ACETAMINOPHEN 160 MG/5ML PO SOLN
325.0000 mg | ORAL | Status: DC | PRN
  Filled 2015-06-17: qty 20.3

## 2015-06-17 MED ORDER — FENTANYL CITRATE (PF) 250 MCG/5ML IJ SOLN
INTRAMUSCULAR | Status: AC
  Filled 2015-06-17: qty 5

## 2015-06-17 MED ORDER — ONDANSETRON HCL 4 MG/2ML IJ SOLN
INTRAMUSCULAR | Status: DC | PRN
  Administered 2015-06-17: 4 mg via INTRAVENOUS

## 2015-06-17 MED ORDER — ACETAMINOPHEN 325 MG PO TABS: 325.0000 mg | ORAL_TABLET | ORAL | Status: DC | PRN

## 2015-06-17 MED ORDER — OXYCODONE HCL 5 MG PO TABS
5.0000 mg | ORAL_TABLET | ORAL | Status: DC | PRN
  Administered 2015-06-17 – 2015-06-18 (×3): 10 mg via ORAL
  Filled 2015-06-17 (×3): qty 2

## 2015-06-17 MED ORDER — OXYCODONE HCL 5 MG/5ML PO SOLN: 5.0000 mg | Freq: Once | ORAL | Status: AC | PRN

## 2015-06-17 MED ORDER — ROCURONIUM BROMIDE 100 MG/10ML IV SOLN
INTRAVENOUS | Status: DC | PRN
  Administered 2015-06-17: 40 mg via INTRAVENOUS

## 2015-06-17 MED ORDER — FENTANYL CITRATE (PF) 100 MCG/2ML IJ SOLN
INTRAMUSCULAR | Status: AC
  Administered 2015-06-17: 50 ug via INTRAVENOUS
  Filled 2015-06-17: qty 2

## 2015-06-17 MED ORDER — MIDAZOLAM HCL 5 MG/5ML IJ SOLN
INTRAMUSCULAR | Status: DC | PRN
  Administered 2015-06-17: 2 mg via INTRAVENOUS

## 2015-06-17 MED ORDER — OXYCODONE HCL 5 MG PO TABS
ORAL_TABLET | ORAL | Status: AC
Start: 2015-06-17 — End: 2015-06-17
  Administered 2015-06-17: 5 mg via ORAL
  Filled 2015-06-17: qty 1

## 2015-06-17 MED ORDER — FENTANYL CITRATE (PF) 100 MCG/2ML IJ SOLN
25.0000 ug | INTRAMUSCULAR | Status: DC | PRN
  Administered 2015-06-17: 50 ug via INTRAVENOUS

## 2015-06-17 MED ORDER — GLYCOPYRROLATE 0.2 MG/ML IJ SOLN
INTRAMUSCULAR | Status: DC | PRN
  Administered 2015-06-17: 0.6 mg via INTRAVENOUS

## 2015-06-17 MED ORDER — INFLUENZA VAC SPLIT QUAD 0.5 ML IM SUSY
0.5000 mL | PREFILLED_SYRINGE | INTRAMUSCULAR | Status: AC
  Administered 2015-06-18: 0.5 mL via INTRAMUSCULAR
  Filled 2015-06-17: qty 0.5

## 2015-06-17 MED ORDER — MIDAZOLAM HCL 2 MG/2ML IJ SOLN
INTRAMUSCULAR | Status: AC
  Filled 2015-06-17: qty 2

## 2015-06-17 MED ORDER — ACETAMINOPHEN 650 MG RE SUPP: 650.0000 mg | RECTAL | Status: DC | PRN

## 2015-06-17 MED ORDER — LACTATED RINGERS IV SOLN
INTRAVENOUS | Status: DC
  Administered 2015-06-17 (×2): via INTRAVENOUS

## 2015-06-17 MED ORDER — OXYCODONE-ACETAMINOPHEN 5-325 MG PO TABS: 1.0000 | ORAL_TABLET | ORAL | Status: DC | PRN

## 2015-06-17 MED ORDER — LIDOCAINE HCL (CARDIAC) 20 MG/ML IV SOLN
INTRAVENOUS | Status: DC | PRN
  Administered 2015-06-17: 40 mg via INTRAVENOUS
  Administered 2015-06-17: 60 mg via INTRAVENOUS

## 2015-06-17 MED ORDER — 0.9 % SODIUM CHLORIDE (POUR BTL) OPTIME
TOPICAL | Status: DC | PRN
  Administered 2015-06-17: 1000 mL

## 2015-06-17 MED ORDER — PHENYLEPHRINE HCL 10 MG/ML IJ SOLN
INTRAMUSCULAR | Status: DC | PRN
  Administered 2015-06-17: 80 ug via INTRAVENOUS
  Administered 2015-06-17: 40 ug via INTRAVENOUS

## 2015-06-17 MED ORDER — ONDANSETRON HCL 4 MG/2ML IJ SOLN
INTRAMUSCULAR | Status: AC
  Filled 2015-06-17: qty 2

## 2015-06-17 MED ORDER — SODIUM CHLORIDE 0.9% FLUSH: 3.0000 mL | Freq: Two times a day (BID) | INTRAVENOUS | Status: DC

## 2015-06-17 MED ORDER — MORPHINE SULFATE (PF) 2 MG/ML IV SOLN
INTRAVENOUS | Status: AC
  Administered 2015-06-17: 2 mg via INTRAVENOUS
  Filled 2015-06-17: qty 1

## 2015-06-17 MED ORDER — ROCURONIUM BROMIDE 50 MG/5ML IV SOLN
INTRAVENOUS | Status: AC
  Filled 2015-06-17: qty 1

## 2015-06-17 MED ORDER — PROPOFOL 10 MG/ML IV BOLUS
INTRAVENOUS | Status: DC | PRN
Start: 2015-06-17 — End: 2015-06-17
  Administered 2015-06-17: 140 mg via INTRAVENOUS
  Administered 2015-06-17: 20 mg via INTRAVENOUS

## 2015-06-17 MED ORDER — NEOSTIGMINE METHYLSULFATE 10 MG/10ML IV SOLN
INTRAVENOUS | Status: DC | PRN
  Administered 2015-06-17: 4 mg via INTRAVENOUS

## 2015-06-17 MED ORDER — PROPOFOL 10 MG/ML IV BOLUS
INTRAVENOUS | Status: AC
  Filled 2015-06-17: qty 20

## 2015-06-17 SURGICAL SUPPLY — 29 items
APPLIER CLIP 5 13 M/L LIGAMAX5 (MISCELLANEOUS) ×2
CANISTER SUCTION 2500CC (MISCELLANEOUS) ×2 IMPLANT
CHLORAPREP W/TINT 26ML (MISCELLANEOUS) ×2 IMPLANT
CLIP APPLIE 5 13 M/L LIGAMAX5 (MISCELLANEOUS) ×1 IMPLANT
COVER SURGICAL LIGHT HANDLE (MISCELLANEOUS) ×2 IMPLANT
ELECT REM PT RETURN 9FT ADLT (ELECTROSURGICAL) ×2
ELECTRODE REM PT RTRN 9FT ADLT (ELECTROSURGICAL) ×1 IMPLANT
GLOVE SURG SIGNA 7.5 PF LTX (GLOVE) ×2 IMPLANT
GOWN STRL REUS W/ TWL LRG LVL3 (GOWN DISPOSABLE) ×2 IMPLANT
GOWN STRL REUS W/ TWL XL LVL3 (GOWN DISPOSABLE) ×1 IMPLANT
GOWN STRL REUS W/TWL LRG LVL3 (GOWN DISPOSABLE) ×2
GOWN STRL REUS W/TWL XL LVL3 (GOWN DISPOSABLE) ×1
KIT BASIN OR (CUSTOM PROCEDURE TRAY) ×2 IMPLANT
KIT ROOM TURNOVER OR (KITS) ×2 IMPLANT
LIQUID BAND (GAUZE/BANDAGES/DRESSINGS) ×2 IMPLANT
NS IRRIG 1000ML POUR BTL (IV SOLUTION) ×2 IMPLANT
PAD ARMBOARD 7.5X6 YLW CONV (MISCELLANEOUS) ×2 IMPLANT
POUCH SPECIMEN RETRIEVAL 10MM (ENDOMECHANICALS) ×2 IMPLANT
SCISSORS LAP 5X35 DISP (ENDOMECHANICALS) ×2 IMPLANT
SET IRRIG TUBING LAPAROSCOPIC (IRRIGATION / IRRIGATOR) ×2 IMPLANT
SLEEVE ENDOPATH XCEL 5M (ENDOMECHANICALS) ×4 IMPLANT
SPECIMEN JAR SMALL (MISCELLANEOUS) ×2 IMPLANT
SUT MON AB 4-0 PC3 18 (SUTURE) ×2 IMPLANT
TOWEL OR 17X24 6PK STRL BLUE (TOWEL DISPOSABLE) ×2 IMPLANT
TOWEL OR 17X26 10 PK STRL BLUE (TOWEL DISPOSABLE) ×2 IMPLANT
TRAY LAPAROSCOPIC MC (CUSTOM PROCEDURE TRAY) ×2 IMPLANT
TROCAR XCEL BLUNT TIP 100MML (ENDOMECHANICALS) ×2 IMPLANT
TROCAR XCEL NON-BLD 5MMX100MML (ENDOMECHANICALS) ×2 IMPLANT
TUBING INSUFFLATION (TUBING) ×2 IMPLANT

## 2015-06-17 NOTE — Transfer of Care (Signed)
Immediate Anesthesia Transfer of Care Note  Patient: Tara Summers  Procedure(s) Performed: Procedure(s): LAPAROSCOPIC CHOLECYSTECTOMY (N/A)  Patient Location: PACU  Anesthesia Type:General  Level of Consciousness: awake, alert  and oriented  Airway & Oxygen Therapy: Patient Spontanous Breathing and Patient connected to nasal cannula oxygen  Post-op Assessment: Report given to RN, Post -op Vital signs reviewed and stable and Patient moving all extremities X 4  Post vital signs: Reviewed and stable  Last Vitals:  Filed Vitals:   06/17/15 0959  BP: 179/82  Pulse: 96  Temp: 36.4 C  Resp: 18    Complications: No apparent anesthesia complications

## 2015-06-17 NOTE — Progress Notes (Signed)
Pt admitted from pacu this pm s/p lap chole with 4 lap sites dressed with dermabond. Alert, oriented and able to voice needs. C/o 10/10 pain, medicated as charted. Pt voided 200cc on unit

## 2015-06-17 NOTE — Interval H&P Note (Signed)
History and Physical Interval Note:no change in H and P  06/17/2015 9:55 AM  Tara Summers  has presented today for surgery, with the diagnosis of biliary dyskinesia  The various methods of treatment have been discussed with the patient and family. After consideration of risks, benefits and other options for treatment, the patient has consented to  Procedure(s): LAPAROSCOPIC CHOLECYSTECTOMY (N/A) as a surgical intervention .  The patient's history has been reviewed, patient examined, no change in status, stable for surgery.  I have reviewed the patient's chart and labs.  Questions were answered to the patient's satisfaction.     [name]

## 2015-06-17 NOTE — Anesthesia Procedure Notes (Signed)
Procedure Name: Intubation Date/Time: 06/17/2015 10:40 AM Performed by: Kyung Rudd Pre-anesthesia Checklist: Patient identified, Emergency Drugs available, Suction available, Patient being monitored and Timeout performed Patient Re-evaluated:Patient Re-evaluated prior to inductionOxygen Delivery Method: Circle system utilized Preoxygenation: Pre-oxygenation with 100% oxygen Intubation Type: IV induction Ventilation: Mask ventilation without difficulty Laryngoscope Size: Mac and 4 Grade View: Grade II Tube type: Oral Tube size: 7.0 mm Number of attempts: 1 Airway Equipment and Method: Stylet Placement Confirmation: ETT inserted through vocal cords under direct vision,  positive ETCO2 and breath sounds checked- equal and bilateral Secured at: 21 cm Tube secured with: Tape Dental Injury: Teeth and Oropharynx as per pre-operative assessment

## 2015-06-17 NOTE — Op Note (Signed)
Laparoscopic Cholecystectomy Procedure Note  Indications: This patient presents with symptomatic gallbladder disease and will undergo laparoscopic cholecystectomy.  Pre-operative Diagnosis: biliary dyskinesia  Post-operative Diagnosis: Same  Surgeon: Coralie Keens A   Assistants: 0  Anesthesia: General endotracheal anesthesia  ASA Class: 2  Procedure Details  The patient was seen again in the Holding Room. The risks, benefits, complications, treatment options, and expected outcomes were discussed with the patient. The possibilities of reaction to medication, pulmonary aspiration, perforation of viscus, bleeding, recurrent infection, finding a normal gallbladder, the need for additional procedures, failure to diagnose a condition, the possible need to convert to an open procedure, and creating a complication requiring transfusion or operation were discussed with the patient. The likelihood of improving the patient's symptoms with return to their baseline status is good.  The patient and/or family concurred with the proposed plan, giving informed consent. The site of surgery properly noted. The patient was taken to Operating Room, identified as Tara Summers and the procedure verified as Laparoscopic Cholecystectomy with Intraoperative Cholangiogram. A Time Out was held and the above information confirmed.  Prior to the induction of general anesthesia, antibiotic prophylaxis was administered. General endotracheal anesthesia was then administered and tolerated well. After the induction, the abdomen was prepped with Chloraprep and draped in sterile fashion. The patient was positioned in the supine position.  Local anesthetic agent was injected into the skin near the umbilicus and an incision made. We dissected down to the abdominal fascia with blunt dissection.  I then entered the abdomen through a small incisional hernia above the umbilicus.  A pursestring suture of 0-Vicryl was placed around  the fascial opening.  The Hasson cannula was inserted and secured with the stay suture.  Pneumoperitoneum was then created with CO2 and tolerated well without any adverse changes in the patient's vital signs. A 5-mm port was placed in the subxiphoid position.  Two 5-mm ports were placed in the right upper quadrant. All skin incisions were infiltrated with a local anesthetic agent before making the incision and placing the trocars. The patient had moderate intra-abdominal adhesions.    We positioned the patient in reverse Trendelenburg, tilted slightly to the patient's left.  The gallbladder was identified, the fundus grasped and retracted cephalad. Adhesions were lysed bluntly and with the electrocautery where indicated, taking care not to injure any adjacent organs or viscus. The infundibulum was grasped and retracted laterally, exposing the peritoneum overlying the triangle of Calot. This was then divided and exposed in a blunt fashion. The cystic duct was clearly identified and bluntly dissected circumferentially. A critical view of the cystic duct and cystic artery was obtained.  The cystic duct was then ligated with clips and divided. The cystic artery was, dissected free, ligated with clips and divided as well.   The gallbladder was dissected from the liver bed in retrograde fashion with the electrocautery. The gallbladder was removed and placed in an Endocatch sac. The liver bed was irrigated and inspected. Hemostasis was achieved with the electrocautery. Copious irrigation was utilized and was repeatedly aspirated until clear.  The gallbladder and Endocatch sac were then removed through the umbilical port site.  The pursestring suture was used to close the umbilical fascia.    We again inspected the right upper quadrant for hemostasis.  Pneumoperitoneum was released as we removed the trocars.  4-0 Monocryl was used to close the skin.   Skin glue was then applied. The patient was then extubated and  brought to the recovery room in  stable condition. Instrument, sponge, and needle counts were correct at closure and at the conclusion of the case.   Findings: Mild chronic Cholecystitis without Cholelithiasis  Estimated Blood Loss: Minimal         Drains: 0         Specimens: Gallbladder           Complications: None; patient tolerated the procedure well.         Disposition: PACU - hemodynamically stable.         Condition: stable

## 2015-06-17 NOTE — Anesthesia Preprocedure Evaluation (Addendum)
Anesthesia Evaluation  Patient identified by MRN, date of birth, ID band Patient awake    Reviewed: Allergy & Precautions, NPO status , Patient's Chart, lab work & pertinent test results  History of Anesthesia Complications Negative for: history of anesthetic complications  Airway Mallampati: II  TM Distance: >3 FB Neck ROM: Full    Dental  (+) Poor Dentition   Pulmonary neg shortness of breath, sleep apnea and Continuous Positive Airway Pressure Ventilation , neg COPD, former smoker,    breath sounds clear to auscultation       Cardiovascular hypertension, Pt. on medications (-) angina(-) Past MI and (-) CHF  Rhythm:Regular     Neuro/Psych  Neuromuscular disease negative psych ROS   GI/Hepatic hiatal hernia, GERD  ,  Endo/Other  diabetes, Type 2Morbid obesity  Renal/GU      Musculoskeletal  (+) Arthritis ,   Abdominal   Peds  Hematology   Anesthesia Other Findings   Reproductive/Obstetrics                           Anesthesia Physical Anesthesia Plan  ASA: III  Anesthesia Plan: General   Post-op Pain Management:    Induction: Intravenous  Airway Management Planned: Oral ETT  Additional Equipment: None  Intra-op Plan:   Post-operative Plan: Extubation in OR  Informed Consent: I have reviewed the patients History and Physical, chart, labs and discussed the procedure including the risks, benefits and alternatives for the proposed anesthesia with the patient or authorized representative who has indicated his/her understanding and acceptance.   Dental advisory given  Plan Discussed with: CRNA and Anesthesiologist  Anesthesia Plan Comments:        Anesthesia Quick Evaluation

## 2015-06-17 NOTE — Discharge Instructions (Signed)
[facility] ______[facility] LAPAROSCOPIC SURGERY: POST OP INSTRUCTIONS Always review your discharge instruction sheet given to you by the facility where your surgery was performed. IF YOU HAVE DISABILITY OR FAMILY LEAVE FORMS, YOU MUST BRING THEM TO THE OFFICE FOR PROCESSING.   DO NOT GIVE THEM TO YOUR DOCTOR.  1. A prescription for pain medication may be given to you upon discharge.  Take your pain medication as prescribed, if needed.  If narcotic pain medicine is not needed, then you may take acetaminophen (Tylenol) or ibuprofen (Advil) as needed. 2. Take your usually prescribed medications unless otherwise directed. 3. If you need a refill on your pain medication, please contact your pharmacy.  They will contact our office to request authorization. Prescriptions will not be filled after 5pm or on week-ends. 4. You should follow a light diet the first few days after arrival home, such as soup and crackers, etc.  Be sure to include lots of fluids daily. 5. Most patients will experience some swelling and bruising in the area of the incisions.  Ice packs will help.  Swelling and bruising can take several days to resolve.  6. It is common to experience some constipation if taking pain medication after surgery.  Increasing fluid intake and taking a stool softener (such as Colace) will usually help or prevent this problem from occurring.  A mild laxative (Milk of Magnesia or Miralax) should be taken according to package instructions if there are no bowel movements after 48 hours. 7. Unless discharge instructions indicate otherwise, you may remove your bandages 24-48 hours after surgery, and you may shower at that time.  You may have steri-strips (small skin tapes) in place directly over the incision.  These strips should be left on the skin for 7-10 days.  If your surgeon used skin glue on the incision, you may shower in 24 hours.  The glue will flake off over the next 2-3 weeks.  Any sutures or  staples will be removed at the office during your follow-up visit. 8. ACTIVITIES:  You may resume regular (light) daily activities beginning the next day--such as daily self-care, walking, climbing stairs--gradually increasing activities as tolerated.  You may have sexual intercourse when it is comfortable.  Refrain from any heavy lifting or straining until approved by your doctor. a. You may drive when you are no longer taking prescription pain medication, you can comfortably wear a seatbelt, and you can safely maneuver your car and apply brakes. b. RETURN TO WORK:  __________________________________________________________ 9. You should see your doctor in the office for a follow-up appointment approximately 2-3 weeks after your surgery.  Make sure that you call for this appointment within a day or two after you arrive home to insure a convenient appointment time. 10. OTHER INSTRUCTIONS: ________________NO LIFTING MORE THAN 15 TO 20 POUND FOR 2 WEEKS 11. ICE PACK AND IBUPROFEN ALSO FOR PAIN__________________________________________________________________________________________________________ __________________________________________________________________________________________________________________________ WHEN TO CALL YOUR DOCTOR: 1. Fever over 101.0 2. Inability to urinate 3. Continued bleeding from incision. 4. Increased pain, redness, or drainage from the incision. 5. Increasing abdominal pain  The clinic staff is available to answer your questions during regular business hours.  Please dont hesitate to call and ask to speak to one of the nurses for clinical concerns.  If you have a medical emergency, go to the nearest emergency room or call 911.  A surgeon from [facility] is always on call at the hospital. [address], [place], [place], [place]  [address] ?  [id number], [place], [place]   [address] [phone] ? [phone] ? FAX [phone] Web site:  [email or web address]

## 2015-06-18 DIAGNOSIS — K819 Cholecystitis, unspecified: Secondary | ICD-10-CM | POA: Diagnosis not present

## 2015-06-18 NOTE — Progress Notes (Signed)
Discharge paperwork reviewed with patient. IV removed. Prescription given. No questions verbalized. Patient is ready for discharge.

## 2015-06-18 NOTE — Progress Notes (Signed)
1 Day Post-Op  Subjective: Complains of incisional pain Kept overnight because of sats dropping when medicated for pain Denies SOB or nausea this morning  Objective: Vital signs in last 24 hours: Temp:  [97.6 F (36.4 C)-98.3 F (36.8 C)] 98.3 F (36.8 C) (02/17 0552) Pulse Rate:  [60-103] 69 (02/17 0552) Resp:  [14-25] 18 (02/17 0552) BP: (130-179)/(52-82) 132/52 mmHg (02/17 0552) SpO2:  [90 %-100 %] 98 % (02/17 0552) Weight:  [104.418 kg (230 lb 3.2 oz)] 104.418 kg (230 lb 3.2 oz) (02/16 0959) Last BM Date: 06/17/15  Intake/Output from previous day: 02/16 0701 - 02/17 0700 In: 1300 [I.V.:1300] Out: 225 [Stool:200; Blood:25] Intake/Output this shift:    Lungs clear CV RRR Abdomen soft, appropriately tender  Lab Results:  No results for input(s): WBC, HGB, HCT, PLT in the last 72 hours. BMET No results for input(s): NA, K, CL, CO2, GLUCOSE, BUN, CREATININE, CALCIUM in the last 72 hours. PT/INR No results for input(s): LABPROT, INR in the last 72 hours. ABG No results for input(s): PHART, HCO3 in the last 72 hours.  Invalid input(s): PCO2, PO2  Studies/Results: No results found.  Anti-infectives: Anti-infectives    Start     Dose/Rate Route Frequency Ordered Stop   06/17/15 1130  ceFAZolin (ANCEF) IVPB 2 g/50 mL premix     2 g 100 mL/hr over 30 Minutes Intravenous To [facility] 06/16/15 0803 06/17/15 1043      Assessment/Plan: s/p Procedure(s): LAPAROSCOPIC CHOLECYSTECTOMY (N/A)  Discharge home today after lunch ambulate     Khole Arterburn A 06/18/2015

## 2015-06-19 NOTE — Anesthesia Postprocedure Evaluation (Signed)
Anesthesia Post Note  Patient: Tara Summers  Procedure(s) Performed: Procedure(s) (LRB): LAPAROSCOPIC CHOLECYSTECTOMY (N/A)  Patient location during evaluation: PACU Anesthesia Type: General Level of consciousness: awake Pain management: pain level controlled Vital Signs Assessment: post-procedure vital signs reviewed and stable Respiratory status: spontaneous breathing Cardiovascular status: stable Postop Assessment: no signs of nausea or vomiting Anesthetic complications: no    Last Vitals:  Filed Vitals:   06/17/15 2108 06/18/15 0552  BP: 130/63 132/52  Pulse: 68 69  Temp: 36.5 C 36.8 C  Resp: 18 18    Last Pain:  Filed Vitals:   06/18/15 1245  PainSc: 5                  [name]

## 2015-06-21 ENCOUNTER — Encounter (HOSPITAL_COMMUNITY): Payer: Self-pay | Admitting: Surgery

## 2015-06-23 NOTE — Discharge Summary (Signed)
Physician Discharge Summary  Patient ID: Tara Summers MRN: [id number] DOB/AGE: Mar 30, 1951 65 y.o.  Admit date: 06/17/2015 Discharge date: 06/23/2015  Admission Diagnoses:  Discharge Diagnoses:  Active Problems:   Surgery, elective   Discharged Condition: good  Hospital Course: uneventful post op recovery s/p lap chole.  Discharged home POD#1  Consults: None  Significant Diagnostic Studies:   Treatments: surgery: laparoscopic cholecystectomy  Discharge Exam: Blood pressure 132/52, pulse 69, temperature 98.3 F (36.8 C), temperature source Oral, resp. rate 18, height 5' (1.524 m), weight 104.418 kg (230 lb 3.2 oz), SpO2 98 %. General appearance: alert, cooperative and no distress Resp: clear to auscultation bilaterally Cardio: regular rate and rhythm, S1, S2 normal, no murmur, click, rub or gallop Incision/Wound:abdomen soft, minimally tender, incisions clean  Disposition: 01-Home or Self Care     Medication List    TAKE these medications        ACCU-CHEK ACTIVE STRIPS test strip  Generic drug:  glucose blood     amLODipine 5 MG tablet  Commonly known as:  NORVASC  Take 5 mg by mouth 2 (two) times daily.     aspirin 81 MG chewable tablet  Chew 81 mg by mouth daily.     metFORMIN 500 MG tablet  Commonly known as:  GLUCOPHAGE  Take 500 mg by mouth 2 (two) times daily with a meal.     olmesartan-hydrochlorothiazide 40-25 MG tablet  Commonly known as:  BENICAR HCT  Take 1 tablet by mouth daily.     oxyCODONE-acetaminophen 5-325 MG tablet  Commonly known as:  ROXICET  Take 1-2 tablets by mouth every 4 (four) hours as needed for severe pain.     polyethylene glycol packet  Commonly known as:  MIRALAX / GLYCOLAX  Take 17 g by mouth daily as needed (constipation).           Follow-up Information    Follow up with [facility], MD. Schedule an appointment as soon as possible for a visit in 3 weeks.   Specialty:  General Surgery   Contact  information:   [address] [place] [address] [phone]       Signed: Harl Bowie 06/23/2015, 8:07 AM

## 2015-10-08 ENCOUNTER — Ambulatory Visit: Payer: BC Managed Care – PPO | Admitting: Gynecology

## 2015-10-15 ENCOUNTER — Ambulatory Visit (INDEPENDENT_AMBULATORY_CARE_PROVIDER_SITE_OTHER): Payer: BC Managed Care – PPO | Admitting: Gynecology

## 2015-10-15 ENCOUNTER — Encounter: Payer: Self-pay | Admitting: Gynecology

## 2015-10-15 VITALS — BP 138/90

## 2015-10-15 DIAGNOSIS — A499 Bacterial infection, unspecified: Secondary | ICD-10-CM | POA: Diagnosis not present

## 2015-10-15 DIAGNOSIS — R300 Dysuria: Secondary | ICD-10-CM

## 2015-10-15 DIAGNOSIS — B9689 Other specified bacterial agents as the cause of diseases classified elsewhere: Secondary | ICD-10-CM

## 2015-10-15 DIAGNOSIS — N760 Acute vaginitis: Secondary | ICD-10-CM

## 2015-10-15 DIAGNOSIS — N949 Unspecified condition associated with female genital organs and menstrual cycle: Secondary | ICD-10-CM | POA: Diagnosis not present

## 2015-10-15 LAB — WET PREP FOR TRICH, YEAST, CLUE
Trich, Wet Prep: NONE SEEN
WBC, Wet Prep HPF POC: NONE SEEN
Yeast Wet Prep HPF POC: NONE SEEN

## 2015-10-15 LAB — URINALYSIS W MICROSCOPIC + REFLEX CULTURE
Bilirubin Urine: NEGATIVE
CRYSTALS: NONE SEEN [HPF]
Casts: NONE SEEN [LPF]
Glucose, UA: NEGATIVE
Ketones, ur: NEGATIVE
LEUKOCYTES UA: NEGATIVE
Nitrite: NEGATIVE
PH: 7 (ref 5.0–8.0)
Protein, ur: NEGATIVE
Specific Gravity, Urine: <1.005 (ref 1.001–1.035)
WBC, UA: NONE SEEN WBC/HPF (ref <=5)
YEAST: NONE SEEN [HPF]

## 2015-10-15 MED ORDER — NITROFURANTOIN MONOHYD MACRO 100 MG PO CAPS: 100.0000 mg | ORAL_CAPSULE | Freq: Two times a day (BID) | ORAL | Status: DC

## 2015-10-15 MED ORDER — METRONIDAZOLE 0.75 % VA GEL: 1.0000 | Freq: Two times a day (BID) | VAGINAL | Status: DC

## 2015-10-15 NOTE — Patient Instructions (Signed)
Metronidazole vaginal gel What is this medicine? METRONIDAZOLE (me troe NI da zole) VAGINAL GEL is an antiinfective. It is used to treat bacterial vaginitis. This medicine may be used for other purposes; ask your health care provider or pharmacist if you have questions. What should I tell my health care provider before I take this medicine? They need to know if you have any of these conditions: -if you drink alcohol containing drinks -if you have taken disulfiram in the past two weeks -liver disease -peripheral neuropathy -seizures -an unusual or allergic reaction to metronidazole, parabens, nitroimidazoles, or other medicines, foods, dyes, or preservatives -pregnant or trying to get pregnant -breast-feeding How should I use this medicine? This medicine is only for use in the vagina. Do not take by mouth or apply to other areas of the body. Follow the directions on the prescription label. Wash hands before and after use. Screw the applicator to the tube and squeeze the tube gently to fill the applicator. Lie on your back, part and bend your knees. Insert the applicator tip high in the vagina and push the plunger to release the gel into the vagina. Gently remove the applicator. Wash the applicator well with warm water and soap. Use at regular intervals. Finish the full course prescribed by your doctor or health care professional even if you think your condition is better. Do not stop using except on the advice of your doctor or health care professional. Talk to your pediatrician regarding the use of this medicine in children. Special care may be needed. Overdosage: If you think you have taken too much of this medicine contact a poison control center or emergency room at once. NOTE: This medicine is only for you. Do not share this medicine with others. What if I miss a dose? If you miss a dose, use it as soon as you can. If it is almost time for your next dose, use only that dose. Do not use double  or extra doses. What may interact with this medicine? Do not take this medicine with any of the following medications: -alcohol or any product that contains alcohol -cisapride -dofetilide -dronedarone -pimozide -thioridazine -ziprasidone This medicine may also interact with the following medications: -cimetidine -lithium -other medicines that prolong the QT interval (cause an abnormal heart rhythm) -warfarin This list may not describe all possible interactions. Give your health care provider a list of all the medicines, herbs, non-prescription drugs, or dietary supplements you use. Also tell them if you smoke, drink alcohol, or use illegal drugs. Some items may interact with your medicine. What should I watch for while using this medicine? Tell your doctor or health care professional if your symptoms do not start to get better in 2 or 3 days. Avoid alcoholic drinks while you are taking this medicine and for three days afterwards. Alcohol may make you feel dizzy, sick, or flushed. You may get drowsy or dizzy. Do not drive, use machinery, or do anything that needs mental alertness until you know how this medicine affects you. To reduce the risk of dizzy or fainting spells, do not sit or stand up quickly, especially if you are an older patient. Your clothing may get soiled if you have a vaginal discharge. You can wear a sanitary napkin. Do not use tampons. Wear freshly washed cotton, not synthetic, panties. Do not have sex until you have finished your treatment. Having sex can make the treatment less effective. Your sexual partner may also need treatment. What side effects may I  notice from receiving this medicine? Side effects that you should report to your doctor or health care professional as soon as possible: -dizziness -frequent passing of urine -headache -loss of appetite -nausea -skin rash, itching -stomach pain or cramps -vaginal irritation or discharge -vulvar burning or  swelling Side effects that usually do not require medical attention (report to your doctor or health care professional if they continue or are bothersome): -dark urine -mild vaginal burning This list may not describe all possible side effects. Call your doctor for medical advice about side effects. You may report side effects to FDA at 1-800-FDA-1088. Where should I keep my medicine? Keep out of the reach of children. Store at room temperature between 15 and 30 degrees C (59 and 86 degrees F). Do not freeze. Throw away any unused medicine after the expiration date. NOTE: This sheet is a summary. It may not cover all possible information. If you have questions about this medicine, talk to your doctor, pharmacist, or health care provider.    2016, Elsevier/Gold Standard. (2012-11-22 14:09:23) Nitrofurantoin tablets or capsules What is this medicine? NITROFURANTOIN (nye troe fyoor AN toyn) is an antibiotic. It is used to treat urinary tract infections. This medicine may be used for other purposes; ask your health care provider or pharmacist if you have questions. What should I tell my health care provider before I take this medicine? They need to know if you have any of these conditions: -anemia -diabetes -glucose-6-phosphate dehydrogenase deficiency -kidney disease -liver disease -lung disease -other chronic illness -an unusual or allergic reaction to nitrofurantoin, other antibiotics, other medicines, foods, dyes or preservatives -pregnant or trying to get pregnant -breast-feeding How should I use this medicine? Take this medicine by mouth with a glass of water. Follow the directions on the prescription label. Take this medicine with food or milk. Take your doses at regular intervals. Do not take your medicine more often than directed. Do not stop taking except on your doctor's advice. Talk to your pediatrician regarding the use of this medicine in children. While this drug may be  prescribed for selected conditions, precautions do apply. Overdosage: If you think you have taken too much of this medicine contact a poison control center or emergency room at once. NOTE: This medicine is only for you. Do not share this medicine with others. What if I miss a dose? If you miss a dose, take it as soon as you can. If it is almost time for your next dose, take only that dose. Do not take double or extra doses. What may interact with this medicine? -antacids containing magnesium trisilicate -probenecid -quinolone antibiotics like ciprofloxacin, lomefloxacin, norfloxacin and ofloxacin -sulfinpyrazone This list may not describe all possible interactions. Give your health care provider a list of all the medicines, herbs, non-prescription drugs, or dietary supplements you use. Also tell them if you smoke, drink alcohol, or use illegal drugs. Some items may interact with your medicine. What should I watch for while using this medicine? Tell your doctor or health care professional if your symptoms do not improve or if you get new symptoms. Drink several glasses of water a day. If you are taking this medicine for a long time, visit your doctor for regular checks on your progress. If you are diabetic, you may get a false positive result for sugar in your urine with certain brands of urine tests. Check with your doctor. What side effects may I notice from receiving this medicine? Side effects that you should report to  your doctor or health care professional as soon as possible: -allergic reactions like skin rash or hives, swelling of the face, lips, or tongue -chest pain -cough -difficulty breathing -dizziness, drowsiness -fever or infection -joint aches or pains -pale or blue-tinted skin -redness, blistering, peeling or loosening of the skin, including inside the mouth -tingling, burning, pain, or numbness in hands or feet -unusual bleeding or bruising -unusually weak or  tired -yellowing of eyes or skin Side effects that usually do not require medical attention (report to your doctor or health care professional if they continue or are bothersome): -dark urine -diarrhea -headache -loss of appetite -nausea or vomiting -temporary hair loss This list may not describe all possible side effects. Call your doctor for medical advice about side effects. You may report side effects to FDA at 1-800-FDA-1088. Where should I keep my medicine? Keep out of the reach of children. Store at room temperature between 15 and 30 degrees C (59 and 86 degrees F). Protect from light. Throw away any unused medicine after the expiration date. NOTE: This sheet is a summary. It may not cover all possible information. If you have questions about this medicine, talk to your doctor, pharmacist, or health care provider.    2016, Elsevier/Gold Standard. (2007-11-06 15:56:47) Urinary Tract Infection Urinary tract infections (UTIs) can develop anywhere along your urinary tract. Your urinary tract is your body's drainage system for removing wastes and extra water. Your urinary tract includes two kidneys, two ureters, a bladder, and a urethra. Your kidneys are a pair of bean-shaped organs. Each kidney is about the size of your fist. They are located below your ribs, one on each side of your spine. CAUSES Infections are caused by microbes, which are microscopic organisms, including fungi, viruses, and bacteria. These organisms are so small that they can only be seen through a microscope. Bacteria are the microbes that most commonly cause UTIs. SYMPTOMS  Symptoms of UTIs may vary by age and gender of the patient and by the location of the infection. Symptoms in young women typically include a frequent and intense urge to urinate and a painful, burning feeling in the bladder or urethra during urination. Older women and men are more likely to be tired, shaky, and weak and have muscle aches and  abdominal pain. A fever may mean the infection is in your kidneys. Other symptoms of a kidney infection include pain in your back or sides below the ribs, nausea, and vomiting. DIAGNOSIS To diagnose a UTI, your caregiver will ask you about your symptoms. Your caregiver will also ask you to provide a urine sample. The urine sample will be tested for bacteria and white blood cells. White blood cells are made by your body to help fight infection. TREATMENT  Typically, UTIs can be treated with medication. Because most UTIs are caused by a bacterial infection, they usually can be treated with the use of antibiotics. The choice of antibiotic and length of treatment depend on your symptoms and the type of bacteria causing your infection. HOME CARE INSTRUCTIONS  If you were prescribed antibiotics, take them exactly as your caregiver instructs you. Finish the medication even if you feel better after you have only taken some of the medication.  Drink enough water and fluids to keep your urine clear or pale yellow.  Avoid caffeine, tea, and carbonated beverages. They tend to irritate your bladder.  Empty your bladder often. Avoid holding urine for long periods of time.  Empty your bladder before and after sexual  intercourse.  After a bowel movement, women should cleanse from front to back. Use each tissue only once. SEEK MEDICAL CARE IF:   You have back pain.  You develop a fever.  Your symptoms do not begin to resolve within 3 days. SEEK IMMEDIATE MEDICAL CARE IF:   You have severe back pain or lower abdominal pain.  You develop chills.  You have nausea or vomiting.  You have continued burning or discomfort with urination. MAKE SURE YOU:   Understand these instructions.  Will watch your condition.  Will get help right away if you are not doing well or get worse.   This information is not intended to replace advice given to you by your health care provider. Make sure you discuss any  questions you have with your health care provider.   Document Released: 01/25/2005 Document Revised: 01/06/2015 Document Reviewed: 05/26/2011 Elsevier Interactive Patient Education 2016 Elsevier Inc. Bacterial Vaginosis Bacterial vaginosis is a vaginal infection that occurs when the normal balance of bacteria in the vagina is disrupted. It results from an overgrowth of certain bacteria. This is the most common vaginal infection in women of childbearing age. Treatment is important to prevent complications, especially in pregnant women, as it can cause a premature delivery. CAUSES  Bacterial vaginosis is caused by an increase in harmful bacteria that are normally present in smaller amounts in the vagina. Several different kinds of bacteria can cause bacterial vaginosis. However, the reason that the condition develops is not fully understood. RISK FACTORS Certain activities or behaviors can put you at an increased risk of developing bacterial vaginosis, including:  Having a new sex partner or multiple sex partners.  Douching.  Using an intrauterine device (IUD) for contraception. Women do not get bacterial vaginosis from toilet seats, bedding, swimming pools, or contact with objects around them. SIGNS AND SYMPTOMS  Some women with bacterial vaginosis have no signs or symptoms. Common symptoms include:  Grey vaginal discharge.  A fishlike odor with discharge, especially after sexual intercourse.  Itching or burning of the vagina and vulva.  Burning or pain with urination. DIAGNOSIS  Your health care provider will take a medical history and examine the vagina for signs of bacterial vaginosis. A sample of vaginal fluid may be taken. Your health care provider will look at this sample under a microscope to check for bacteria and abnormal cells. A vaginal pH test may also be done.  TREATMENT  Bacterial vaginosis may be treated with antibiotic medicines. These may be given in the form of a pill  or a vaginal cream. A second round of antibiotics may be prescribed if the condition comes back after treatment. Because bacterial vaginosis increases your risk for sexually transmitted diseases, getting treated can help reduce your risk for chlamydia, gonorrhea, HIV, and herpes. HOME CARE INSTRUCTIONS   Only take over-the-counter or prescription medicines as directed by your health care provider.  If antibiotic medicine was prescribed, take it as directed. Make sure you finish it even if you start to feel better.  Tell all sexual partners that you have a vaginal infection. They should see their health care provider and be treated if they have problems, such as a mild rash or itching.  During treatment, it is important that you follow these instructions:  Avoid sexual activity or use condoms correctly.  Do not douche.  Avoid alcohol as directed by your health care provider.  Avoid breastfeeding as directed by your health care provider. SEEK MEDICAL CARE IF:   Your  symptoms are not improving after 3 days of treatment.  You have increased discharge or pain.  You have a fever. MAKE SURE YOU:   Understand these instructions.  Will watch your condition.  Will get help right away if you are not doing well or get worse. FOR MORE INFORMATION  Centers for Disease Control and Prevention, Division of STD Prevention: [employer] American Sexual Health Association (ASHA): www.ashastd.org    This information is not intended to replace advice given to you by your health care provider. Make sure you discuss any questions you have with your health care provider.   Document Released: 04/17/2005 Document Revised: 05/08/2014 Document Reviewed: 11/27/2012 Elsevier Interactive Patient Education [employer].

## 2015-10-15 NOTE — Progress Notes (Signed)
   HPI: Patient is a 65 year old that presented to the office complaining 1 week history of dysuria and some vaginal burning. She tried over-the-counter Azo with minimal relief. She denies any fever, chills, nausea, vomiting or any back pain. Only suprapubic discomfort. Patient had a laparoscopic cholecystectomy in February this year. Patient with prior history of TVH BSO. Patient also has a history of right breast cancer whereby she had a right lumpectomy in the past. She was last seen the office for her annual GYN exam in August 2016.    ROS: A ROS was performed and pertinent positives and negatives are included in the history.  GENERAL: No fevers or chills. HEENT: No change in vision, no earache, sore throat or sinus congestion. NECK: No pain or stiffness. CARDIOVASCULAR: No chest pain or pressure. No palpitations. PULMONARY: No shortness of breath, cough or wheeze. GASTROINTESTINAL: No abdominal pain, nausea, vomiting or diarrhea, melena or bright red blood per rectum. GENITOURINARY: Frequency and dysuria MUSCULOSKELETAL: No joint or muscle pain, no back pain, no recent trauma. DERMATOLOGIC: No rash, no itching, no lesions. ENDOCRINE: No polyuria, polydipsia, no heat or cold intolerance. No recent change in weight. HEMATOLOGICAL: No anemia or easy bruising or bleeding. NEUROLOGIC: No headache, seizures, numbness, tingling or weakness. PSYCHIATRIC: No depression, no loss of interest in normal activity or change in sleep pattern.   PE: Blood pressure 138/90 Gen. appearance well-developed well-nourished female with the above mentioned complaint Back: No CVA tenderness Abdomen: Soft nontender no rebound or guarding Pelvic: Bartholin urethra Skene was within normal limits Vagina no true discharge although vaginal mucosa was hyperemic. Vaginal cuff intact Bimanual exam only suprapubic tenderness but no rebound or guarding no palpable masses Rectal exam not done  Urinalysis few clue cells well with  many bacteria  Urinalysis few bacteria, 0-2 RBC urine culture pending    Assessment Plan: Patient with signs and symptoms have a suggestive of a urinary tract infection and instead of waiting over the week and wait for cultures that she has been having dysuria and frequency with no other ahead and treat her with Macrobid one by mouth twice a day for 7 days as we wait for urine culture. It appears she may have a mild form of bacterial vaginosis so she is going to be prescribed MetroGel to apply intravaginally daily at bedtime for one week.    Greater than 50% of time was spent in counseling and coordinating care of this patient.   Time of consultation: 15   Minutes.

## 2015-10-17 LAB — URINE CULTURE: Colony Count: 50000

## 2015-12-22 ENCOUNTER — Encounter: Payer: BC Managed Care – PPO | Admitting: Gynecology

## 2016-01-11 ENCOUNTER — Encounter: Payer: Self-pay | Admitting: Gynecology

## 2016-01-11 ENCOUNTER — Telehealth: Payer: Self-pay | Admitting: *Deleted

## 2016-01-11 ENCOUNTER — Ambulatory Visit (INDEPENDENT_AMBULATORY_CARE_PROVIDER_SITE_OTHER): Payer: BC Managed Care – PPO | Admitting: Gynecology

## 2016-01-11 VITALS — BP 136/84 | Ht 60.0 in | Wt 234.0 lb

## 2016-01-11 DIAGNOSIS — N3281 Overactive bladder: Secondary | ICD-10-CM | POA: Diagnosis not present

## 2016-01-11 DIAGNOSIS — Z01419 Encounter for gynecological examination (general) (routine) without abnormal findings: Secondary | ICD-10-CM

## 2016-01-11 DIAGNOSIS — E1110 Type 2 diabetes mellitus with ketoacidosis without coma: Secondary | ICD-10-CM

## 2016-01-11 DIAGNOSIS — Z8639 Personal history of other endocrine, nutritional and metabolic disease: Secondary | ICD-10-CM

## 2016-01-11 DIAGNOSIS — M8580 Other specified disorders of bone density and structure, unspecified site: Secondary | ICD-10-CM

## 2016-01-11 NOTE — Patient Instructions (Signed)
Bone Densitometry Bone densitometry is an imaging test that uses a special X-ray to measure the amount of calcium and other minerals in your bones (bone density). This test is also known as a bone mineral density test or dual-energy X-ray absorptiometry (DXA). The test can measure bone density at your hip and your spine. It is similar to having a regular X-ray. You may have this test to:  Diagnose a condition that causes weak or thin bones (osteoporosis).  Predict your risk of a broken bone (fracture).  Determine how well osteoporosis treatment is working. LET [facility] CARE PROVIDER KNOW ABOUT:  Any allergies you have.  All medicines you are taking, including vitamins, herbs, eye drops, creams, and over-the-counter medicines.  Previous problems you or members of your family have had with the use of anesthetics.  Any blood disorders you have.  Previous surgeries you have had.  Medical conditions you have.  Possibility of pregnancy.  Any other medical test you had within the previous 14 days that used contrast material. RISKS AND COMPLICATIONS Generally, this is a safe procedure. However, problems can occur and may include the following:  This test exposes you to a very small amount of radiation.  The risks of radiation exposure may be greater to unborn children. BEFORE THE PROCEDURE  Do not take any calcium supplements for 24 hours before having the test. You can otherwise eat and drink what you usually do.  Take off all metal jewelry, eyeglasses, dental appliances, and any other metal objects. PROCEDURE  You may lie on an exam table. There will be an X-ray generator below you and an imaging device above you.  Other devices, such as boxes or braces, may be used to position your body properly for the scan.  You will need to lie still while the machine slowly scans your body.  The images will show up on a computer monitor. AFTER THE PROCEDURE You may need more testing  at a later time.   This information is not intended to replace advice given to you by your health care provider. Make sure you discuss any questions you have with your health care provider.   Document Released: 05/09/2004 Document Revised: 05/08/2014 Document Reviewed: 09/25/2013 Elsevier Interactive Patient Education 2016 Elsevier Inc. Influenza Virus Vaccine (Flucelvax) What is this medicine? INFLUENZA VIRUS VACCINE (in floo EN zuh VAHY ruhs vak SEEN) helps to reduce the risk of getting influenza also known as the flu. The vaccine only helps protect you against some strains of the flu. This medicine may be used for other purposes; ask your health care provider or pharmacist if you have questions. What should I tell my health care provider before I take this medicine? They need to know if you have any of these conditions: -bleeding disorder like hemophilia -fever or infection -Guillain-Barre syndrome or other neurological problems -immune system problems -infection with the human immunodeficiency virus (HIV) or AIDS -low blood platelet counts -multiple sclerosis -an unusual or allergic reaction to influenza virus vaccine, other medicines, foods, dyes or preservatives -pregnant or trying to get pregnant -breast-feeding How should I use this medicine? This vaccine is for injection into a muscle. It is given by a health care professional. A copy of Vaccine Information Statements will be given before each vaccination. Read this sheet carefully each time. The sheet may change frequently. Talk to your pediatrician regarding the use of this medicine in children. Special care may be needed. Overdosage: If you think you've taken too much of this  medicine contact a poison control center or emergency room at once. Overdosage: If you think you have taken too much of this medicine contact a poison control center or emergency room at once. NOTE: This medicine is only for you. Do not share this  medicine with others. What if I miss a dose? This does not apply. What may interact with this medicine? -chemotherapy or radiation therapy -medicines that lower your immune system like etanercept, anakinra, infliximab, and adalimumab -medicines that treat or prevent blood clots like warfarin -phenytoin -steroid medicines like prednisone or cortisone -theophylline -vaccines This list may not describe all possible interactions. Give your health care provider a list of all the medicines, herbs, non-prescription drugs, or dietary supplements you use. Also tell them if you smoke, drink alcohol, or use illegal drugs. Some items may interact with your medicine. What should I watch for while using this medicine? Report any side effects that do not go away within 3 days to your doctor or health care professional. Call your health care provider if any unusual symptoms occur within 6 weeks of receiving this vaccine. You may still catch the flu, but the illness is not usually as bad. You cannot get the flu from the vaccine. The vaccine will not protect against colds or other illnesses that may cause fever. The vaccine is needed every year. What side effects may I notice from receiving this medicine? Side effects that you should report to your doctor or health care professional as soon as possible: -allergic reactions like skin rash, itching or hives, swelling of the face, lips, or tongue Side effects that usually do not require medical attention (Report these to your doctor or health care professional if they continue or are bothersome.): -fever -headache -muscle aches and pains -pain, tenderness, redness, or swelling at the injection site -tiredness This list may not describe all possible side effects. Call your doctor for medical advice about side effects. You may report side effects to FDA at 1-800-FDA-1088. Where should I keep my medicine? The vaccine will be given by a health care professional in  a clinic, pharmacy, doctor's office, or other health care setting. You will not be given vaccine doses to store at home. NOTE: This sheet is a summary. It may not cover all possible information. If you have questions about this medicine, talk to your doctor, pharmacist, or health care provider.    2016, Elsevier/Gold Standard. (2011-03-29 14:06:47) Kegel Exercises The goal of Kegel exercises is to isolate and exercise your pelvic floor muscles. These muscles act as a hammock that supports the rectum, vagina, small intestine, and uterus. As the muscles weaken, the hammock sags and these organs are displaced from their normal positions. Kegel exercises can strengthen your pelvic floor muscles and help you to improve bladder and bowel control, improve sexual response, and help reduce many problems and some discomfort during pregnancy. Kegel exercises can be done anywhere and at any time. HOW TO PERFORM KEGEL EXERCISES 1. Locate your pelvic floor muscles. To do this, squeeze (contract) the muscles that you use when you try to stop the flow of urine. You will feel a tightness in the vaginal area (women) and a tight lift in the rectal area (men and women). 2. When you begin, contract your pelvic muscles tight for 2-5 seconds, then relax them for 2-5 seconds. This is one set. Do 4-5 sets with a short pause in between. 3. Contract your pelvic muscles for 8-10 seconds, then relax them for 8-10 seconds. Do  4-5 sets. If you cannot contract your pelvic muscles for 8-10 seconds, try 5-7 seconds and work your way up to 8-10 seconds. Your goal is 4-5 sets of 10 contractions each day. Keep your stomach, buttocks, and legs relaxed during the exercises. Perform sets of both short and long contractions. Vary your positions. Perform these contractions 3-4 times per day. Perform sets while you are:   Lying in bed in the morning.  Standing at lunch.  Sitting in the late afternoon.  Lying in bed at night. You should  do 40-50 contractions per day. Do not perform more Kegel exercises per day than recommended. Overexercising can cause muscle fatigue. Continue these exercises for for at least 15-20 weeks or as directed by your caregiver.   This information is not intended to replace advice given to you by your health care provider. Make sure you discuss any questions you have with your health care provider.   Document Released: 04/03/2012 Document Revised: 05/08/2014 Document Reviewed: 04/03/2012 Elsevier Interactive Patient Education [employer].

## 2016-01-11 NOTE — Progress Notes (Signed)
Tara Summers 06-12-1950 [id number]   History:    65 y.o.  for annual gyn exam with complaining at times of urinary frequency and nocturia. Patient is a type II diabetic on metformin 500 mg twice a day and has started to work on controlling her fluid intake which has helped with her urgency incontinence. Patient states she had a colonoscopy last year was uncertain of the diagnosis was instructed to return back in 5 years? Patient has a history of a TVH with BSO for benign entity several years ago.  Patient this year had a cholecystectomy. She also has a history of a right breast cancer whereby she had a right lumpectomy and is currently being followed by the oncologist Dr. Sondra Come. She does have history of hypertension but has not had any blood work by her PCP in over year and patient wanted to get established with a new medical doctor. Review of her record indicated that her last bone density was in 2014 in the lowest T score was -1.1 at the [facility]. Patient states that she is taking her calcium and vitamin D. Patient with no prior history of abnormal Pap smear even before her hysterectomy. Patient on no hormone replacement therapy.   Past medical history,surgical history, family history and social history were all reviewed and documented in the EPIC chart.  Gynecologic History No LMP recorded. Patient has had a hysterectomy. Contraception: status post hysterectomy Last Pap: 2014. Results were: normal Last mammogram: 2017. Results were: normal  Obstetric History OB History  Gravida Para Term Preterm AB Living  7 4 3 1 2 4   SAB TAB Ectopic Multiple Live Births    1     4    # Outcome Date GA Lbr Len/2nd Weight Sex Delivery Anes PTL Lv  7 AB           6 TAB           5 Term     F CS-Unspec  N LIV  4 Preterm     F CS-Unspec  Y LIV  3 Term     F Vag-Spont  N LIV  2 Term     F Vag-Spont  N LIV  1 Gravida             Obstetric Comments  Menarche age 4, Parity, G7, P4, surgical  Menopause, No HRT Use     ROS: A ROS was performed and pertinent positives and negatives are included in the history.  GENERAL: No fevers or chills. HEENT: No change in vision, no earache, sore throat or sinus congestion. NECK: No pain or stiffness. CARDIOVASCULAR: No chest pain or pressure. No palpitations. PULMONARY: No shortness of breath, cough or wheeze. GASTROINTESTINAL: No abdominal pain, nausea, vomiting or diarrhea, melena or bright red blood per rectum. GENITOURINARY: No urinary frequency, urgency, hesitancy or dysuria. MUSCULOSKELETAL: No joint or muscle pain, no back pain, no recent trauma. DERMATOLOGIC: No rash, no itching, no lesions. ENDOCRINE: No polyuria, polydipsia, no heat or cold intolerance. No recent change in weight. HEMATOLOGICAL: No anemia or easy bruising or bleeding. NEUROLOGIC: No headache, seizures, numbness, tingling or weakness. PSYCHIATRIC: No depression, no loss of interest in normal activity or change in sleep pattern.     Exam: chaperone present  BP 136/84   Ht 5' (1.524 m)   Wt 234 lb (106.1 kg)   BMI 45.70 kg/m   Body mass index is 45.7 kg/m.  General appearance : Well developed well nourished  female. No acute distress HEENT: Eyes: no retinal hemorrhage or exudates,  Neck supple, trachea midline, no carotid bruits, no thyroidmegaly Lungs: Clear to auscultation, no rhonchi or wheezes, or rib retractions  Heart: Regular rate and rhythm, no murmurs or gallops Breast:Examined in sitting and supine position were symmetrical in appearance, no palpable masses or tenderness,  no skin retraction, no nipple inversion, no nipple discharge, no skin discoloration, no axillary or supraclavicular lymphadenopathy Abdomen: no palpable masses or tenderness, no rebound or guarding Extremities: no edema or skin discoloration or tenderness  Pelvic:  Bartholin, Urethra, Skene Glands: Within normal limits             Vagina: No gross lesions or discharge, atrophic  changes  Cervix: Absent  Uterus  absent  Adnexa  Without masses or tenderness  Anus and perineum  normal   Rectovaginal  normal sphincter tone without palpated masses or tenderness             Hemoccult colonoscopy less than 12 months ago     Assessment/Plan:  65 y.o. female for annual exam with history of osteopenia overdue for bone density study which will be schedule here in the office in the next few weeks. Also type II diabetic with no apparent surveillance in over a year. We are going to check her hemoglobin A1c today and encourage her to continue her metformin 500 mg by mouth twice a day. I'm going to make a referral appointment for her with one of our endocrinologists as well as to monitor her hypertension and diabetes. Because of her history of osteopenia were going to check her vitamin D level today. She was instructed country around on her fluid intake before bedtime to that she does not have to get up at night to urinate. I provided her with instructions on [place] exercises to strengthen her pubic muscles. Patient received the flu vaccine today.   Terrance Mass MD, 3:00 PM 01/11/2016

## 2016-01-11 NOTE — Telephone Encounter (Signed)
-----   Message from Terrance Mass, MD sent at 01/11/2016  2:59 PM EDT ----- Anderson Malta, please begin appointment for this type II diabetic poorly controlled and needs to be seen by an endocrinologist. Please make an appointment with Dr. Renato Shin.

## 2016-01-11 NOTE — Telephone Encounter (Signed)
Referral placed in epic at [facility] they will contact pt to schedule. 

## 2016-01-12 ENCOUNTER — Other Ambulatory Visit: Payer: Self-pay | Admitting: Gynecology

## 2016-01-12 DIAGNOSIS — E559 Vitamin D deficiency, unspecified: Secondary | ICD-10-CM

## 2016-01-12 LAB — HEMOGLOBIN A1C
Hgb A1c MFr Bld: 6.2 % — ABNORMAL HIGH (ref <5.7)
MEAN PLASMA GLUCOSE: 131 mg/dL

## 2016-01-12 LAB — VITAMIN D 25 HYDROXY (VIT D DEFICIENCY, FRACTURES): Vit D, 25-Hydroxy: 16 ng/mL — ABNORMAL LOW (ref 30–100)

## 2016-01-12 MED ORDER — VITAMIN D (ERGOCALCIFEROL) 1.25 MG (50000 UNIT) PO CAPS: 50000.0000 [IU] | ORAL_CAPSULE | ORAL | 0 refills | Status: DC

## 2016-01-13 NOTE — Telephone Encounter (Signed)
Appointment on 02/09/16 @ 9:00am

## 2016-02-09 ENCOUNTER — Ambulatory Visit (INDEPENDENT_AMBULATORY_CARE_PROVIDER_SITE_OTHER): Payer: BC Managed Care – PPO | Admitting: Endocrinology

## 2016-02-09 ENCOUNTER — Encounter: Payer: Self-pay | Admitting: Endocrinology

## 2016-02-09 VITALS — BP 122/82 | HR 84 | Ht 60.0 in | Wt 235.0 lb

## 2016-02-09 DIAGNOSIS — Z23 Encounter for immunization: Secondary | ICD-10-CM

## 2016-02-09 DIAGNOSIS — Z8639 Personal history of other endocrine, nutritional and metabolic disease: Secondary | ICD-10-CM

## 2016-02-09 LAB — TSH: TSH: 2.3 u[IU]/mL (ref 0.35–4.50)

## 2016-02-09 LAB — LIPID PANEL
CHOLESTEROL: 204 mg/dL — AB (ref 0–200)
HDL: 46.1 mg/dL (ref >39.00)
LDL CALC: 138 mg/dL — AB (ref 0–99)
NonHDL: 157.67
Total CHOL/HDL Ratio: 4
Triglycerides: 98 mg/dL (ref 0.0–149.0)
VLDL: 19.6 mg/dL (ref 0.0–40.0)

## 2016-02-09 MED ORDER — GLUCOSE BLOOD VI STRP: 1.0000 | ORAL_STRIP | Freq: Every day | 12 refills | Status: DC
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
## 2016-02-09 NOTE — Patient Instructions (Addendum)
good diet and exercise significantly improve the control of your diabetes.  please let me know if you wish to be referred to a dietician.  high blood sugar is very risky to your health.  you should see an eye doctor and dentist every year.  It is very important to get all recommended vaccinations.  Controlling your blood pressure and cholesterol drastically reduces the damage diabetes does to your body.  Those who smoke should quit.  Please discuss these with your doctor.  Please consider having weight loss surgery.  It is good for your health.  Here is some information about it.  If you decide to consider further, please call the phone number in the papers, and register for a free informational meeting check your blood sugar once a week.  vary the time of day when you check, between before the 3 meals, and at bedtime.  also check if you have symptoms of your blood sugar being too high or too low.  please keep a record of the readings and bring it to your next appointment here (or you can bring the meter itself).  You can write it on any piece of paper.  please call us sooner if your blood sugar goes below 70, or if you have a lot of readings over 200.   Here are 2 new meters. I have sent a prescription to your pharmacy, for strips.   Please continue the same medications.   Please come back for a follow-up appointment in 4-6 months.

## 2016-02-10 DIAGNOSIS — Z0289 Encounter for other administrative examinations: Secondary | ICD-10-CM

## 2016-04-02 ENCOUNTER — Other Ambulatory Visit: Payer: Self-pay | Admitting: Gynecology

## 2016-06-13 ENCOUNTER — Ambulatory Visit: Payer: BC Managed Care – PPO | Admitting: Endocrinology

## 2016-09-13 ENCOUNTER — Encounter: Payer: Self-pay | Admitting: Gynecology

## 2016-12-30 DIAGNOSIS — G4730 Sleep apnea, unspecified: Secondary | ICD-10-CM

## 2016-12-30 HISTORY — DX: Sleep apnea, unspecified: G47.30

## 2017-01-17 NOTE — H&P (Signed)
HISTORY AND PHYSICAL  Tara Summers is a 66 y.o. female patient with CC: painful teeth  No diagnosis found.  Past Medical History:  Diagnosis Date  . Arthritis   . Breast cancer ([place])    right breast lumpectomy  . 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 Chronic back pain   . Constipation    takes Miralax daily as needed  . Diabetes mellitus    takes Metformin daily. Type II  . Diverticulosis   . GERD (gastroesophageal reflux disease)    not taking any medications  . History of blood transfusion    no abnormal reaction noted  . History of bronchitis 3 yrs ago  . History of hiatal hernia   . History of staph infection > 4 yrs ago  . Hypertension    takes Amlodipine and Benicar daily  . Joint pain   . Joint swelling    left  . Nocturia   . Peripheral neuropathy ([place])   . Pneumonia > 5 yrs ago   hx of   . Sleep apnea    rule out progression  . Urinary frequency   . Urinary urgency   . Vitamin D deficiency    takes Vit D occasionally    No current facility-administered medications for this encounter.    Current Outpatient Prescriptions  Medication Sig Dispense Refill  . ACCU-CHEK ACTIVE STRIPS test strip     . amLODipine (NORVASC) 5 MG tablet Take 5 mg by mouth 2 (two) times daily.    Marland Kitchen aspirin 81 MG chewable tablet Chew 81 mg by mouth daily.    Marland Kitchen glucose blood (BAYER CONTOUR TEST) test strip 1 each by Other route daily. And lancets 1/day 100 each 12  . metFORMIN (GLUCOPHAGE) 500 MG tablet Take 500 mg by mouth 2 (two) times daily with a meal.    . olmesartan-hydrochlorothiazide (BENICAR HCT) 40-25 MG tablet Take 1 tablet by mouth daily.  4  . oxyCODONE-acetaminophen (ROXICET) 5-325 MG tablet Take 1-2 tablets by mouth every 4 (four) hours as needed for severe pain. (Patient not taking: Reported on 02/09/2016) 30 tablet 0  . polyethylene glycol (MIRALAX / GLYCOLAX) packet Take 17 g by mouth daily as needed (constipation).     . Vitamin D, Ergocalciferol,  (DRISDOL) 50000 units CAPS capsule Take 1 capsule (50,000 Units total) by mouth every 7 (seven) days. 12 capsule 0   No Known Allergies Active Problems:   * No active hospital problems. *  Vitals: There were no vitals taken for this visit. Lab results:No results found for this or any previous visit (from the past 44 hour(s)). Radiology Results: No results found. General appearance: alert, cooperative and morbidly obese Head: Normocephalic, without obvious abnormality, atraumatic Eyes: negative Nose: Nares normal. Septum midline. Mucosa normal. No drainage or sinus tenderness. Throat: multiple carious teeth, pharynx clear no masses or lesions Neck: no adenopathy, supple, symmetrical, trachea midline and thyroid not enlarged, symmetric, no tenderness/mass/nodules Resp: clear to auscultation bilaterally Cardio: regular rate and rhythm, S1, S2 normal, no murmur, click, rub or gallop  Assessment: multiple nonrestorable teeth secondary to dental caries.  Plan: multiple dental extractions with alveoloplasty. GA. Nasal. Day surgery.   [name] 01/17/2017

## 2017-01-18 ENCOUNTER — Encounter (HOSPITAL_COMMUNITY): Payer: Self-pay | Admitting: *Deleted

## 2017-01-18 NOTE — Progress Notes (Signed)
PCP: Dr. Maudie Mercury briscoe--request ekg Dr. Melvenia Needles-  [facility] [place] sleep study  Fasting sugars 118-129 Instructed on diabetes protocol for NPO status.

## 2017-01-19 ENCOUNTER — Ambulatory Visit (HOSPITAL_COMMUNITY): Admission: RE | Admit: 2017-01-19 | Payer: BC Managed Care – PPO | Source: Ambulatory Visit | Admitting: Oral Surgery

## 2017-01-19 SURGERY — DENTAL RESTORATION/EXTRACTIONS
Anesthesia: General

## 2017-02-16 ENCOUNTER — Ambulatory Visit (HOSPITAL_COMMUNITY): Admission: RE | Admit: 2017-02-16 | Payer: BC Managed Care – PPO | Source: Ambulatory Visit | Admitting: Oral Surgery

## 2017-02-16 ENCOUNTER — Encounter (HOSPITAL_COMMUNITY): Admission: RE | Payer: Self-pay | Source: Ambulatory Visit

## 2017-02-16 SURGERY — EXTRACTION, TOOTH, MOLAR
Anesthesia: General

## 2017-03-13 ENCOUNTER — Encounter: Payer: BC Managed Care – PPO | Admitting: Women's Health

## 2017-03-13 DIAGNOSIS — Z0289 Encounter for other administrative examinations: Secondary | ICD-10-CM

## 2017-06-18 ENCOUNTER — Ambulatory Visit: Payer: Medicare Other | Admitting: Women's Health

## 2017-06-18 ENCOUNTER — Encounter: Payer: Self-pay | Admitting: Women's Health

## 2017-06-18 VITALS — BP 144/88 | Ht 61.0 in | Wt 235.0 lb

## 2017-06-18 DIAGNOSIS — Z01419 Encounter for gynecological examination (general) (routine) without abnormal findings: Secondary | ICD-10-CM | POA: Diagnosis not present

## 2017-06-18 DIAGNOSIS — M810 Age-related osteoporosis without current pathological fracture: Secondary | ICD-10-CM

## 2017-06-18 DIAGNOSIS — Z13820 Encounter for screening for osteoporosis: Secondary | ICD-10-CM

## 2017-06-18 DIAGNOSIS — R350 Frequency of micturition: Secondary | ICD-10-CM | POA: Diagnosis not present

## 2017-06-18 DIAGNOSIS — M8588 Other specified disorders of bone density and structure, other site: Secondary | ICD-10-CM

## 2017-06-18 DIAGNOSIS — Z780 Asymptomatic menopausal state: Secondary | ICD-10-CM

## 2017-06-18 NOTE — Patient Instructions (Addendum)
Health Maintenance for Postmenopausal Women Menopause is a normal process in which your reproductive ability comes to an end. This process happens gradually over a span of months to years, usually between the ages of 22 and 9. Menopause is complete when you have missed 12 consecutive menstrual periods. It is important to talk with your health care provider about some of the most common conditions that affect postmenopausal women, such as heart disease, cancer, and bone loss (osteoporosis). Adopting a healthy lifestyle and getting preventive care can help to promote your health and wellness. Those actions can also lower your chances of developing some of these common conditions. What should I know about menopause? During menopause, you may experience a number of symptoms, such as:  Moderate-to-severe hot flashes.  Night sweats.  Decrease in sex drive.  Mood swings.  Headaches.  Tiredness.  Irritability.  Memory problems.  Insomnia.  Choosing to treat or not to treat menopausal changes is an individual decision that you make with your health care provider. What should I know about hormone replacement therapy and supplements? Hormone therapy products are effective for treating symptoms that are associated with menopause, such as hot flashes and night sweats. Hormone replacement carries certain risks, especially as you become older. If you are thinking about using estrogen or estrogen with progestin treatments, discuss the benefits and risks with your health care provider. What should I know about heart disease and stroke? Heart disease, heart attack, and stroke become more likely as you age. This may be due, in part, to the hormonal changes that your body experiences during menopause. These can affect how your body processes dietary fats, triglycerides, and cholesterol. Heart attack and stroke are both medical emergencies. There are many things that you can do to help prevent heart disease  and stroke:  Have your blood pressure checked at least every 1-2 years. High blood pressure causes heart disease and increases the risk of stroke.  If you are 53-22 years old, ask your health care provider if you should take aspirin to prevent a heart attack or a stroke.  Do not use any tobacco products, including cigarettes, chewing tobacco, or electronic cigarettes. If you need help quitting, ask your health care provider.  It is important to eat a healthy diet and maintain a healthy weight. ? Be sure to include plenty of vegetables, fruits, low-fat dairy products, and lean protein. ? Avoid eating foods that are high in solid fats, added sugars, or salt (sodium).  Get regular exercise. This is one of the most important things that you can do for your health. ? Try to exercise for at least 150 minutes each week. The type of exercise that you do should increase your heart rate and make you sweat. This is known as moderate-intensity exercise. ? Try to do strengthening exercises at least twice each week. Do these in addition to the moderate-intensity exercise.  Know your numbers.Ask your health care provider to check your cholesterol and your blood glucose. Continue to have your blood tested as directed by your health care provider.  What should I know about cancer screening? There are several types of cancer. Take the following steps to reduce your risk and to catch any cancer development as early as possible. Breast Cancer  Practice breast self-awareness. ? This means understanding how your breasts normally appear and feel. ? It also means doing regular breast self-exams. Let your health care provider know about any changes, no matter how small.  If you are 40  or older, have a clinician do a breast exam (clinical breast exam or CBE) every year. Depending on your age, family history, and medical history, it may be recommended that you also have a yearly breast X-ray (mammogram).  If you  have a family history of breast cancer, talk with your health care provider about genetic screening.  If you are at high risk for breast cancer, talk with your health care provider about having an MRI and a mammogram every year.  Breast cancer (BRCA) gene test is recommended for women who have family members with BRCA-related cancers. Results of the assessment will determine the need for genetic counseling and BRCA1 and for BRCA2 testing. BRCA-related cancers include these types: ? Breast. This occurs in males or females. ? Ovarian. ? Tubal. This may also be called fallopian tube cancer. ? Cancer of the abdominal or pelvic lining (peritoneal cancer). ? Prostate. ? Pancreatic.  Cervical, Uterine, and Ovarian Cancer Your health care provider may recommend that you be screened regularly for cancer of the pelvic organs. These include your ovaries, uterus, and vagina. This screening involves a pelvic exam, which includes checking for microscopic changes to the surface of your cervix (Pap test).  For women ages 21-65, health care providers may recommend a pelvic exam and a Pap test every three years. For women ages 79-65, they may recommend the Pap test and pelvic exam, combined with testing for human papilloma virus (HPV), every five years. Some types of HPV increase your risk of cervical cancer. Testing for HPV may also be done on women of any age who have unclear Pap test results.  Other health care providers may not recommend any screening for nonpregnant women who are considered low risk for pelvic cancer and have no symptoms. Ask your health care provider if a screening pelvic exam is right for you.  If you have had past treatment for cervical cancer or a condition that could lead to cancer, you need Pap tests and screening for cancer for at least 20 years after your treatment. If Pap tests have been discontinued for you, your risk factors (such as having a new sexual partner) need to be  reassessed to determine if you should start having screenings again. Some women have medical problems that increase the chance of getting cervical cancer. In these cases, your health care provider may recommend that you have screening and Pap tests more often.  If you have a family history of uterine cancer or ovarian cancer, talk with your health care provider about genetic screening.  If you have vaginal bleeding after reaching menopause, tell your health care provider.  There are currently no reliable tests available to screen for ovarian cancer.  Lung Cancer Lung cancer screening is recommended for adults 69-62 years old who are at high risk for lung cancer because of a history of smoking. A yearly low-dose CT scan of the lungs is recommended if you:  Currently smoke.  Have a history of at least 30 pack-years of smoking and you currently smoke or have quit within the past 15 years. A pack-year is smoking an average of one pack of cigarettes per day for one year.  Yearly screening should:  Continue until it has been 15 years since you quit.  Stop if you develop a health problem that would prevent you from having lung cancer treatment.  Colorectal Cancer  This type of cancer can be detected and can often be prevented.  Routine colorectal cancer screening usually begins at  age 42 and continues through age 45.  If you have risk factors for colon cancer, your health care provider may recommend that you be screened at an earlier age.  If you have a family history of colorectal cancer, talk with your health care provider about genetic screening.  Your health care provider may also recommend using home test kits to check for hidden blood in your stool.  A small camera at the end of a tube can be used to examine your colon directly (sigmoidoscopy or colonoscopy). This is done to check for the earliest forms of colorectal cancer.  Direct examination of the colon should be repeated every  5-10 years until age 71. However, if early forms of precancerous polyps or small growths are found or if you have a family history or genetic risk for colorectal cancer, you may need to be screened more often.  Skin Cancer  Check your skin from head to toe regularly.  Monitor any moles. Be sure to tell your health care provider: ? About any new moles or changes in moles, especially if there is a change in a mole's shape or color. ? If you have a mole that is larger than the size of a pencil eraser.  If any of your family members has a history of skin cancer, especially at a Eleny Cortez age, talk with your health care provider about genetic screening.  Always use sunscreen. Apply sunscreen liberally and repeatedly throughout the day.  Whenever you are outside, protect yourself by wearing long sleeves, pants, a wide-brimmed hat, and sunglasses.  What should I know about osteoporosis? Osteoporosis is a condition in which bone destruction happens more quickly than new bone creation. After menopause, you may be at an increased risk for osteoporosis. To help prevent osteoporosis or the bone fractures that can happen because of osteoporosis, the following is recommended:  If you are 46-71 years old, get at least 1,000 mg of calcium and at least 600 mg of vitamin D per day.  If you are older than age 55 but younger than age 65, get at least 1,200 mg of calcium and at least 600 mg of vitamin D per day.  If you are older than age 54, get at least 1,200 mg of calcium and at least 800 mg of vitamin D per day.  Smoking and excessive alcohol intake increase the risk of osteoporosis. Eat foods that are rich in calcium and vitamin D, and do weight-bearing exercises several times each week as directed by your health care provider. What should I know about how menopause affects my mental health? Depression may occur at any age, but it is more common as you become older. Common symptoms of depression  include:  Low or sad mood.  Changes in sleep patterns.  Changes in appetite or eating patterns.  Feeling an overall lack of motivation or enjoyment of activities that you previously enjoyed.  Frequent crying spells.  Talk with your health care provider if you think that you are experiencing depression. What should I know about immunizations? It is important that you get and maintain your immunizations. These include:  Tetanus, diphtheria, and pertussis (Tdap) booster vaccine.  Influenza every year before the flu season begins.  Pneumonia vaccine.  Shingles vaccine.  Your health care provider may also recommend other immunizations. This information is not intended to replace advice given to you by your health care provider. Make sure you discuss any questions you have with your health care provider. Document Released: 06/09/2005  Document Revised: 11/05/2015 Document Reviewed: 01/19/2015 Elsevier Interactive Patient Education  2018 [employer].  Carbohydrate Counting for Diabetes Mellitus, Adult Carbohydrate counting is a method for keeping track of how many carbohydrates you eat. Eating carbohydrates naturally increases the amount of sugar (glucose) in the blood. Counting how many carbohydrates you eat helps keep your blood glucose within normal limits, which helps you manage your diabetes (diabetes mellitus). It is important to know how many carbohydrates you can safely have in each meal. This is different for every person. A diet and nutrition specialist (registered dietitian) can help you make a meal plan and calculate how many carbohydrates you should have at each meal and snack. Carbohydrates are found in the following foods:  Grains, such as breads and cereals.  Dried beans and soy products.  Starchy vegetables, such as potatoes, peas, and corn.  Fruit and fruit juices.  Milk and yogurt.  Sweets and snack foods, such as cake, cookies, candy, chips, and soft  drinks.  How do I count carbohydrates? There are two ways to count carbohydrates in food. You can use either of the methods or a combination of both. Reading "Nutrition Facts" on packaged food The "Nutrition Facts" list is included on the labels of almost all packaged foods and beverages in the [place] It includes:  The serving size.  Information about nutrients in each serving, including the grams (g) of carbohydrate per serving.  To use the "Nutrition Facts":  Decide how many servings you will have.  Multiply the number of servings by the number of carbohydrates per serving.  The resulting number is the total amount of carbohydrates that you will be having.  Learning standard serving sizes of other foods When you eat foods containing carbohydrates that are not packaged or do not include "Nutrition Facts" on the label, you need to measure the servings in order to count the amount of carbohydrates:  Measure the foods that you will eat with a food scale or measuring cup, if needed.  Decide how many standard-size servings you will eat.  Multiply the number of servings by 15. Most carbohydrate-rich foods have about 15 g of carbohydrates per serving. ? For example, if you eat 8 oz (170 g) of strawberries, you will have eaten 2 servings and 30 g of carbohydrates (2 servings x 15 g = 30 g).  For foods that have more than one food mixed, such as soups and casseroles, you must count the carbohydrates in each food that is included.  The following list contains standard serving sizes of common carbohydrate-rich foods. Each of these servings has about 15 g of carbohydrates:   hamburger bun or  English muffin.   oz (15 mL) syrup.   oz (14 g) jelly.  1 slice of bread.  1 six-inch tortilla.  3 oz (85 g) cooked rice or pasta.  4 oz (113 g) cooked dried beans.  4 oz (113 g) starchy vegetable, such as peas, corn, or potatoes.  4 oz (113 g) hot cereal.  4 oz (113 g) mashed potatoes  or  of a large baked potato.  4 oz (113 g) canned or frozen fruit.  4 oz (120 mL) fruit juice.  4-6 crackers.  6 chicken nuggets.  6 oz (170 g) unsweetened dry cereal.  6 oz (170 g) plain fat-free yogurt or yogurt sweetened with artificial sweeteners.  8 oz (240 mL) milk.  8 oz (170 g) fresh fruit or one small piece of fruit.  24 oz (680 g)  popcorn.  Example of carbohydrate counting Sample meal  3 oz (85 g) chicken breast.  6 oz (170 g) brown rice.  4 oz (113 g) corn.  8 oz (240 mL) milk.  8 oz (170 g) strawberries with sugar-free whipped topping. Carbohydrate calculation 1. Identify the foods that contain carbohydrates: ? Rice. ? Corn. ? Milk. ? Strawberries. 2. Calculate how many servings you have of each food: ? 2 servings rice. ? 1 serving corn. ? 1 serving milk. ? 1 serving strawberries. 3. Multiply each number of servings by 15 g: ? 2 servings rice x 15 g = 30 g. ? 1 serving corn x 15 g = 15 g. ? 1 serving milk x 15 g = 15 g. ? 1 serving strawberries x 15 g = 15 g. 4. Add together all of the amounts to find the total grams of carbohydrates eaten: ? 30 g + 15 g + 15 g + 15 g = 75 g of carbohydrates total. This information is not intended to replace advice given to you by your health care provider. Make sure you discuss any questions you have with your health care provider. Document Released: 04/17/2005 Document Revised: 11/05/2015 Document Reviewed: 09/29/2015 Elsevier Interactive Patient Education  2018 [employer] Inc[employer]  

## 2017-06-18 NOTE — Progress Notes (Signed)
NETTYE FLEGAL [birth year] [id number]    History:    Presents for breast and pelvic exam. TVH with BSO years ago for benign purposes. 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 2017 negative colonoscopy 5 year follow-up. 2015 -1.1 without elevated FRAX. Primary care manages hypertension, diabetes, hypercholesterolemia and GERD. Has had pneumonia vaccine unsure if she has had Zostavax. Currently in the process of having dental extractions and partials made.  Past medical history, past surgical history, family history and social history were all reviewed and documented in the EPIC chart. 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  ROS:  A ROS was performed and pertinent positives and negatives are included.  Exam:  Vitals:   06/18/17 1541  BP: (!) 144/88  Weight: 235 lb (106.6 kg)  Height: 5' 1"  (1.549 m)   Body mass index is 44.4 kg/m.   General appearance:  Normal Thyroid:  Symmetrical, normal in size, without palpable masses or nodularity. Respiratory  Auscultation:  Clear without wheezing or rhonchi Cardiovascular  Auscultation:  Regular rate, without rubs, murmurs or gallops  Edema/varicosities:  Not grossly evident Abdominal  Soft,nontender, without masses, guarding or rebound.  Liver/spleen:  No organomegaly noted  Hernia:  None appreciated  Skin  Inspection:  Grossly normal   Breasts: Examined lying and sitting.     Right: Noticeably smaller, well-healed incision at 6:00, reports no changes   Left: Without masses, retractions, discharge or axillary adenopathy. Gentitourinary   Inguinal/mons:  Normal without inguinal adenopathy  External genitalia:  Normal  BUS/Urethra/Skene's glands:  Normal  Vagina:  Normal  Cervix: And uterus absent Adnexa/parametria:     Rt: Without masses or tenderness.   Lt: Without masses or tenderness.  Anus and perineum: Normal  Digital rectal exam: Normal sphincter tone  without palpated masses or tenderness  Assessment/Plan:  67 y.o. MBF G7 P4 for breast and pelvic exam with no GYN complaints.  TVH with BSO benign purposes no HRT 2008 right breast cancer lumpectomy/radiation Obesity Hypertension/diabetes/hypercholesterolemia/GERD primary care manages labs and meds  Plan: SBE's, continue annual screening mammogram, calcium rich foods, vitamin D 2000 daily encouraged. Reviewed importance of home safety, fall prevention and balance type exercise, encouraged to yoga or tai chi. Encourage decrease calories/carbs for weight loss.    Huel Cote [facility], 5:16 PM 06/18/2017

## 2017-06-20 LAB — URINALYSIS, COMPLETE W/RFL CULTURE
BILIRUBIN URINE: NEGATIVE
Glucose, UA: NEGATIVE
HYALINE CAST: NONE SEEN /LPF
Ketones, ur: NEGATIVE
LEUKOCYTE ESTERASE: NEGATIVE
Nitrites, Initial: NEGATIVE
SPECIFIC GRAVITY, URINE: 1.027 (ref 1.001–1.03)
pH: 6 (ref 5.0–8.0)

## 2017-06-20 LAB — CULTURE INDICATED

## 2017-06-20 LAB — URINE CULTURE
MICRO NUMBER:: 90218082
SPECIMEN QUALITY: ADEQUATE

## 2017-06-29 DIAGNOSIS — M8580 Other specified disorders of bone density and structure, unspecified site: Secondary | ICD-10-CM

## 2017-06-29 HISTORY — DX: Other specified disorders of bone density and structure, unspecified site: M85.80

## 2017-07-04 ENCOUNTER — Other Ambulatory Visit: Payer: Self-pay | Admitting: Gynecology

## 2017-07-04 DIAGNOSIS — M810 Age-related osteoporosis without current pathological fracture: Principal | ICD-10-CM

## 2017-07-04 DIAGNOSIS — M8580 Other specified disorders of bone density and structure, unspecified site: Secondary | ICD-10-CM

## 2017-07-12 ENCOUNTER — Other Ambulatory Visit: Payer: Self-pay | Admitting: Gynecology

## 2017-07-12 ENCOUNTER — Encounter: Payer: Self-pay | Admitting: Gynecology

## 2017-07-12 ENCOUNTER — Ambulatory Visit (INDEPENDENT_AMBULATORY_CARE_PROVIDER_SITE_OTHER): Payer: Medicare Other

## 2017-07-12 DIAGNOSIS — M85851 Other specified disorders of bone density and structure, right thigh: Secondary | ICD-10-CM

## 2017-07-12 DIAGNOSIS — M8589 Other specified disorders of bone density and structure, multiple sites: Secondary | ICD-10-CM

## 2017-07-12 DIAGNOSIS — Z780 Asymptomatic menopausal state: Secondary | ICD-10-CM

## 2017-07-12 DIAGNOSIS — M8588 Other specified disorders of bone density and structure, other site: Secondary | ICD-10-CM | POA: Diagnosis not present

## 2017-07-12 DIAGNOSIS — M810 Age-related osteoporosis without current pathological fracture: Principal | ICD-10-CM

## 2017-08-30 ENCOUNTER — Encounter (INDEPENDENT_AMBULATORY_CARE_PROVIDER_SITE_OTHER): Payer: Medicare Other

## 2017-09-05 ENCOUNTER — Ambulatory Visit (INDEPENDENT_AMBULATORY_CARE_PROVIDER_SITE_OTHER): Payer: Medicare Other | Admitting: Family Medicine

## 2017-10-02 ENCOUNTER — Other Ambulatory Visit: Payer: Self-pay

## 2017-10-02 ENCOUNTER — Encounter (HOSPITAL_COMMUNITY): Payer: Self-pay | Admitting: *Deleted

## 2017-10-02 NOTE — H&P (Signed)
HISTORY AND PHYSICAL  Tara Summers is a 67 y.o. female patient referred by general dentist for multiple extractions  No diagnosis found.  Past Medical History:  Diagnosis Date  . Arthritis   . Breast cancer ([place])    right breast lumpectomy  . 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 Chronic back pain   . Constipation    takes Miralax daily as needed  . Diabetes mellitus    takes Metformin daily. Type II  . Diverticulosis   . GERD (gastroesophageal reflux disease)    not taking any medications  . History of blood transfusion    no abnormal reaction noted  . History of bronchitis 3 yrs ago  . History of hiatal hernia   . History of staph infection > 4 yrs ago  . Hypertension    takes Amlodipine and Benicar daily  . Joint pain   . Joint swelling    left  . Nocturia   . Osteopenia 06/2017   T score -1.3 FRAX 2.4% / 0.2%  . Peripheral neuropathy   . Pneumonia > 5 yrs ago   hx of   . Sleep apnea    rule out progression  . Urinary frequency   . Urinary urgency   . Vitamin D deficiency    takes Vit D occasionally    No current facility-administered medications for this encounter.    Current Outpatient Medications  Medication Sig Dispense Refill  . amLODipine (NORVASC) 10 MG tablet Take 10 mg by mouth daily.     Marland Kitchen aspirin EC 81 MG tablet Take 81 mg by mouth daily.    . Cholecalciferol (VITAMIN D) 2000 units tablet Take 1 tablet by mouth every other day.     . Menthol, Topical Analgesic, (ICY HOT EX) Apply 1 application topically daily as needed (knee pain).    . metFORMIN (GLUCOPHAGE-XR) 500 MG 24 hr tablet Take 500 mg by mouth daily.   1  . metoprolol tartrate (LOPRESSOR) 25 MG tablet Take 25 mg by mouth daily.     Marland Kitchen olmesartan-hydrochlorothiazide (BENICAR HCT) 40-25 MG tablet Take 1 tablet by mouth daily.  4  . polyethylene glycol (MIRALAX / GLYCOLAX) packet Take 17 g by mouth daily as needed for moderate constipation.     . simvastatin (ZOCOR) 20 MG  tablet Take 20 mg by mouth at bedtime.  3  . glucose blood (BAYER CONTOUR TEST) test strip 1 each by Other route daily. And lancets 1/day (Patient not taking: Reported on 06/18/2017) 100 each 12   No Known Allergies Active Problems:   * No active hospital problems. *  Vitals: There were no vitals taken for this visit. Lab results:No results found for this or any previous visit (from the past 5 hour(s)). Radiology Results: No results found. General appearance: alert, cooperative and morbidly obese Head: Normocephalic, without obvious abnormality, atraumatic Eyes: negative Nose: Nares normal. Septum midline. Mucosa normal. No drainage or sinus tenderness. Throat: Multiple dental caries, bone loss, Pharynx clear Neck: no adenopathy, supple, symmetrical, trachea midline and thyroid not enlarged, symmetric, no tenderness/mass/nodules Resp: clear to auscultation bilaterally Cardio: regular rate and rhythm, S1, S2 normal, no murmur, click, rub or gallop  Assessment: Multiple nonrestorable teeth  Plan:Multiple dental extractions with alveoloplasty. GA. Day surgery.   Diona Browner 10/02/2017

## 2017-10-02 NOTE — Progress Notes (Signed)
I spoke with Dr Gifford Shave regarding cardiologist recommendations and that patient cancelled treadmill stress test. (Patient reported that she was unaware of ECHO appointment)  Dr Gifford Shave said that patient will have to be cleared by cardiologist prior to OR.  I notified Dr Hoyt Koch.

## 2017-10-02 NOTE — Progress Notes (Addendum)
Ms Muldoon was seen 07/2017 at [facility] for c/o chest pain.  Patient was schedule to have an Echo and stress test and she cancelled per cardiology office.  I notified Samantha at Dr Lupita Leash office. I spoke with patient, she said that she did not go for stress test because she could not do a tredmill test, "I have knee problems and I need a knee replacement." "I cant remember anything about the chest pain, I haven't had any since then."  Patient does not check CBG any longer, I have blood work every 2 months.  I instructed patient to check CBG tonight and in am.  Do not take metformin in am.   I instructed patient to check CBG after awaking and every 2 hours until arrival  to the hospital.  I Instructed patient if CBG is less than 70 to drink 1/2 cup of a clear juice. Recheck CBG in 15 minutes then call pre- op desk at [phone] for further instructions.

## 2017-10-03 ENCOUNTER — Encounter (HOSPITAL_COMMUNITY): Payer: Self-pay | Admitting: Anesthesiology

## 2017-10-03 ENCOUNTER — Ambulatory Visit (HOSPITAL_COMMUNITY)
Admission: RE | Admit: 2017-10-03 | Discharge: 2017-10-03 | Disposition: A | Discharge status: Home / Self Care | Payer: Medicare Other | Source: Ambulatory Visit | Attending: Oral Surgery | Admitting: Oral Surgery

## 2017-10-03 ENCOUNTER — Encounter (HOSPITAL_COMMUNITY): Payer: Self-pay | Admitting: *Deleted

## 2017-10-03 ENCOUNTER — Encounter (HOSPITAL_COMMUNITY)
Admission: RE | Disposition: A | Discharge status: Home / Self Care | Payer: Self-pay | Source: Ambulatory Visit | Attending: Oral Surgery

## 2017-10-03 DIAGNOSIS — Z538 Procedure and treatment not carried out for other reasons: Secondary | ICD-10-CM | POA: Diagnosis not present

## 2017-10-03 DIAGNOSIS — K219 Gastro-esophageal reflux disease without esophagitis: Secondary | ICD-10-CM | POA: Insufficient documentation

## 2017-10-03 DIAGNOSIS — Z7984 Long term (current) use of oral hypoglycemic drugs: Secondary | ICD-10-CM | POA: Diagnosis not present

## 2017-10-03 DIAGNOSIS — Z79899 Other long term (current) drug therapy: Secondary | ICD-10-CM | POA: Insufficient documentation

## 2017-10-03 DIAGNOSIS — I10 Essential (primary) hypertension: Secondary | ICD-10-CM | POA: Diagnosis not present

## 2017-10-03 DIAGNOSIS — Z853 Personal history of malignant neoplasm of breast: Secondary | ICD-10-CM | POA: Insufficient documentation

## 2017-10-03 DIAGNOSIS — Z7982 Long term (current) use of aspirin: Secondary | ICD-10-CM | POA: Diagnosis not present

## 2017-10-03 DIAGNOSIS — K029 Dental caries, unspecified: Secondary | ICD-10-CM | POA: Diagnosis present

## 2017-10-03 DIAGNOSIS — E1140 Type 2 diabetes mellitus with diabetic neuropathy, unspecified: Secondary | ICD-10-CM | POA: Insufficient documentation

## 2017-10-03 HISTORY — DX: Adverse effect of unspecified anesthetic, initial encounter: T41.45XA

## 2017-10-03 HISTORY — DX: Other complications of anesthesia, initial encounter: T88.59XA

## 2017-10-03 LAB — CBC
HCT: 38.5 % (ref 36.0–46.0)
Hemoglobin: 12.8 g/dL (ref 12.0–15.0)
MCH: 28.1 pg (ref 26.0–34.0)
MCHC: 33.2 g/dL (ref 30.0–36.0)
MCV: 84.6 fL (ref 78.0–100.0)
PLATELETS: 282 10*3/uL (ref 150–400)
RBC: 4.55 MIL/uL (ref 3.87–5.11)
RDW: 12.3 % (ref 11.5–15.5)
WBC: 9.3 10*3/uL (ref 4.0–10.5)

## 2017-10-03 LAB — GLUCOSE, CAPILLARY: GLUCOSE-CAPILLARY: 176 mg/dL — AB (ref 65–99)

## 2017-10-03 LAB — HEMOGLOBIN A1C
Hgb A1c MFr Bld: 6.4 % — ABNORMAL HIGH (ref 4.8–5.6)
Mean Plasma Glucose: 136.98 mg/dL

## 2017-10-03 LAB — BASIC METABOLIC PANEL
ANION GAP: 7 (ref 5–15)
BUN: 10 mg/dL (ref 6–20)
CALCIUM: 8.9 mg/dL (ref 8.9–10.3)
CO2: 31 mmol/L (ref 22–32)
Chloride: 102 mmol/L (ref 101–111)
Creatinine, Ser: 0.77 mg/dL (ref 0.44–1.00)
GFR calc Af Amer: >60 mL/min (ref >60)
GLUCOSE: 199 mg/dL — AB (ref 65–99)
Potassium: 3.1 mmol/L — ABNORMAL LOW (ref 3.5–5.1)
SODIUM: 140 mmol/L (ref 135–145)

## 2017-10-03 SURGERY — DENTAL RESTORATION/EXTRACTION WITH X-RAY
Anesthesia: General

## 2017-10-03 MED ORDER — CEFAZOLIN SODIUM-DEXTROSE 2-4 GM/100ML-% IV SOLN
INTRAVENOUS | Status: AC
  Filled 2017-10-03: qty 100

## 2017-10-03 MED ORDER — LIDOCAINE-EPINEPHRINE 2 %-1:100000 IJ SOLN
INTRAMUSCULAR | Status: AC
  Filled 2017-10-03: qty 1

## 2017-10-03 MED ORDER — CEFAZOLIN SODIUM-DEXTROSE 2-4 GM/100ML-% IV SOLN: 2.0000 g | INTRAVENOUS | Status: DC

## 2017-10-03 MED ORDER — LACTATED RINGERS IV SOLN
INTRAVENOUS | Status: DC
  Administered 2017-10-03: 11:00:00 via INTRAVENOUS

## 2017-10-03 NOTE — Progress Notes (Signed)
Patient cancelled due to needing cardiac clearance.

## 2017-11-26 ENCOUNTER — Ambulatory Visit: Payer: Medicare Other | Admitting: Women's Health

## 2018-01-10 ENCOUNTER — Other Ambulatory Visit: Payer: Self-pay | Admitting: Gastroenterology

## 2018-01-10 DIAGNOSIS — R1310 Dysphagia, unspecified: Secondary | ICD-10-CM

## 2018-01-22 ENCOUNTER — Ambulatory Visit (HOSPITAL_COMMUNITY): Admission: RE | Admit: 2018-01-22 | Payer: Medicare Other | Source: Ambulatory Visit

## 2018-02-08 ENCOUNTER — Other Ambulatory Visit: Payer: Self-pay | Admitting: Endocrinology

## 2018-02-08 ENCOUNTER — Ambulatory Visit (HOSPITAL_COMMUNITY)
Admission: RE | Admit: 2018-02-08 | Discharge: 2018-02-08 | Disposition: A | Discharge status: Home / Self Care | Payer: Medicare Other | Source: Ambulatory Visit | Attending: Gastroenterology | Admitting: Gastroenterology

## 2018-02-08 DIAGNOSIS — Q394 Esophageal web: Secondary | ICD-10-CM | POA: Diagnosis not present

## 2018-02-08 DIAGNOSIS — K219 Gastro-esophageal reflux disease without esophagitis: Secondary | ICD-10-CM | POA: Insufficient documentation

## 2018-02-08 DIAGNOSIS — R1310 Dysphagia, unspecified: Secondary | ICD-10-CM | POA: Insufficient documentation

## 2018-03-06 NOTE — H&P (Signed)
HISTORY AND PHYSICAL  Tara Summers is a 67 y.o. female patient with CC: referred by general dentist for multiple extractions in preparation for denture and partial denture.   No diagnosis found.  Past Medical History:  Diagnosis Date  . Arthritis   . Breast cancer ([place])    right breast lumpectomy  . 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 Chronic back pain   . Complication of anesthesia    slow to awaken  . Constipation    takes Miralax daily as needed  . Diabetes mellitus    takes Metformin daily. Type II  . Diverticulosis   . GERD (gastroesophageal reflux disease)    not taking any medications  . History of blood transfusion    no abnormal reaction noted  . History of bronchitis 3 yrs ago  . History of hiatal hernia   . History of staph infection > 4 yrs ago  . Hypertension    takes Amlodipine and Benicar daily  . Joint pain   . Joint swelling    left  . Nocturia   . Osteopenia 06/2017   T score -1.3 FRAX 2.4% / 0.2%  . Peripheral neuropathy   . Pneumonia > 5 yrs ago   hx of   . Sleep apnea 12/2016   has not been back for CPAP  . Urinary frequency   . Urinary urgency   . Vitamin D deficiency    takes Vit D occasionally    No current facility-administered medications for this encounter.    Current Outpatient Medications  Medication Sig Dispense Refill  . amLODipine (NORVASC) 5 MG tablet Take 10 mg by mouth daily.  1  . aspirin EC 81 MG tablet Take 81 mg by mouth 2 (two) times a week.     Marland Kitchen atorvastatin (LIPITOR) 20 MG tablet Take 20 mg by mouth daily.    . Calcium Carb-Cholecalciferol (CALCIUM+D3 PO) Take 1 tablet by mouth daily.    . Cholecalciferol (VITAMIN D) 2000 units tablet Take 2,000 Units by mouth 2 (two) times a week.     . Menthol, Topical Analgesic, (ICY HOT EX) Apply 1 application topically daily as needed (knee pain).    . metFORMIN (GLUCOPHAGE-XR) 500 MG 24 hr tablet Take 500 mg by mouth daily.   1  . metoprolol succinate  (TOPROL-XL) 25 MG 24 hr tablet Take 25 mg by mouth daily.  1  . olmesartan-hydrochlorothiazide (BENICAR HCT) 40-25 MG tablet Take 1 tablet by mouth daily.  4  . omeprazole (PRILOSEC) 20 MG capsule Take 20 mg by mouth daily.    . polyethylene glycol (MIRALAX / GLYCOLAX) packet Take 17 g by mouth daily as needed for moderate constipation.     . CONTOUR TEST test strip EVERY DAY 100 each 4   No Known Allergies Active Problems:   * No active hospital problems. *  Vitals: There were no vitals taken for this visit. Lab results:No results found for this or any previous visit (from the past 68 hour(s)). Radiology Results: No results found. General appearance: alert, cooperative and morbidly obese Head: Normocephalic, without obvious abnormality, atraumatic Eyes: negative Nose: Nares normal. Septum midline. Mucosa normal. No drainage or sinus tenderness. Throat: multiple carious teeth, no purulence, edema, fluctuance, pharynx clear.  Neck: no adenopathy, supple, symmetrical, trachea midline and thyroid not enlarged, symmetric, no tenderness/mass/nodules Resp: clear to auscultation bilaterally Cardio: regular rate and rhythm, S1, S2 normal, no murmur, click, rub or gallop  Assessment: multiple nonrestorable teeth  Plan: multiple extractions with alveoloplasty. GA. Day surgery.    Diona Browner 03/06/2018

## 2018-03-07 ENCOUNTER — Encounter (HOSPITAL_COMMUNITY): Payer: Self-pay | Admitting: *Deleted

## 2018-03-07 ENCOUNTER — Other Ambulatory Visit: Payer: Self-pay

## 2018-03-07 NOTE — Progress Notes (Signed)
Spoke with pt for pre-op call. Pt denies any recent chest pain or sob. Pt has seen a cardiologist this year and has had an Echo/Stress test done, have requested those results from Dr. Valetta Fuller' office. I called Dr. Lupita Leash office to see if they had a cardiac clearance on pt (her surgery in June was cancelled due to the fact she needed cardiac clearance. I spoke with Aldona Bar at the office and she is faxing the clearance to [place]. Pt is a type 2 diabetic, last A1C was 6.4 on 10/03/17. She states she only checks her blood sugar occasionally, states it's usually in the 130's. Instructed pt not to take her Metformin in the AM. Instructed her to check her blood sugar when she gets up in the AM. If blood sugar is 70 or below, treat with 1/2 cup of clear juice (apple or cranberry) and recheck blood sugar 15 minutes after drinking juice. Pt voiced understanding.

## 2018-03-07 NOTE — Anesthesia Preprocedure Evaluation (Addendum)
Anesthesia Evaluation  Patient identified by MRN, date of birth, ID band Patient awake    Reviewed: Allergy & Precautions, NPO status , Patient's Chart, lab work & pertinent test results  History of Anesthesia Complications Negative for: history of anesthetic complications  Airway Mallampati: II  TM Distance: >3 FB Neck ROM: Full    Dental  (+) Poor Dentition, Missing   Pulmonary sleep apnea , former smoker,    Pulmonary exam normal        Cardiovascular hypertension, Pt. on home beta blockers and Pt. on medications Normal cardiovascular exam     Neuro/Psych negative neurological ROS  negative psych ROS   GI/Hepatic Neg liver ROS, hiatal hernia, GERD  ,  Endo/Other  diabetesMorbid obesity  Renal/GU negative Renal ROS  negative genitourinary   Musculoskeletal negative musculoskeletal ROS (+)   Abdominal   Peds  Hematology negative hematology ROS (+)   Anesthesia Other Findings Normal stress echo in June, EF 71%  Reproductive/Obstetrics                            Anesthesia Physical Anesthesia Plan  ASA: III  Anesthesia Plan: General   Post-op Pain Management:    Induction:   PONV Risk Score and Plan: 3 and Ondansetron, Dexamethasone, Midazolam and Treatment may vary due to age or medical condition  Airway Management Planned: Nasal ETT  Additional Equipment: None  Intra-op Plan:   Post-operative Plan: Extubation in OR  Informed Consent: I have reviewed the patients History and Physical, chart, labs and discussed the procedure including the risks, benefits and alternatives for the proposed anesthesia with the patient or authorized representative who has indicated his/her understanding and acceptance.     Plan Discussed with:   Anesthesia Plan Comments:       Anesthesia Quick Evaluation

## 2018-03-08 ENCOUNTER — Encounter (HOSPITAL_COMMUNITY): Payer: Self-pay

## 2018-03-08 ENCOUNTER — Encounter (HOSPITAL_COMMUNITY)
Admission: RE | Disposition: A | Discharge status: Home / Self Care | Payer: Self-pay | Source: Ambulatory Visit | Attending: Oral Surgery

## 2018-03-08 ENCOUNTER — Ambulatory Visit (HOSPITAL_COMMUNITY): Payer: Medicare Other | Admitting: Registered Nurse

## 2018-03-08 ENCOUNTER — Ambulatory Visit (HOSPITAL_COMMUNITY)
Admission: RE | Admit: 2018-03-08 | Discharge: 2018-03-08 | Disposition: A | Discharge status: Home / Self Care | Payer: Medicare Other | Source: Ambulatory Visit | Attending: Oral Surgery | Admitting: Oral Surgery

## 2018-03-08 DIAGNOSIS — K449 Diaphragmatic hernia without obstruction or gangrene: Secondary | ICD-10-CM | POA: Diagnosis not present

## 2018-03-08 DIAGNOSIS — I10 Essential (primary) hypertension: Secondary | ICD-10-CM | POA: Insufficient documentation

## 2018-03-08 DIAGNOSIS — Z79899 Other long term (current) drug therapy: Secondary | ICD-10-CM | POA: Diagnosis not present

## 2018-03-08 DIAGNOSIS — Z7982 Long term (current) use of aspirin: Secondary | ICD-10-CM | POA: Diagnosis not present

## 2018-03-08 DIAGNOSIS — Z853 Personal history of malignant neoplasm of breast: Secondary | ICD-10-CM | POA: Diagnosis not present

## 2018-03-08 DIAGNOSIS — E559 Vitamin D deficiency, unspecified: Secondary | ICD-10-CM | POA: Insufficient documentation

## 2018-03-08 DIAGNOSIS — G8929 Other chronic pain: Secondary | ICD-10-CM | POA: Insufficient documentation

## 2018-03-08 DIAGNOSIS — M1990 Unspecified osteoarthritis, unspecified site: Secondary | ICD-10-CM | POA: Insufficient documentation

## 2018-03-08 DIAGNOSIS — Z8619 Personal history of other infectious and parasitic diseases: Secondary | ICD-10-CM | POA: Insufficient documentation

## 2018-03-08 DIAGNOSIS — K219 Gastro-esophageal reflux disease without esophagitis: Secondary | ICD-10-CM | POA: Diagnosis not present

## 2018-03-08 DIAGNOSIS — Z6841 Body Mass Index (BMI) 40.0 and over, adult: Secondary | ICD-10-CM | POA: Diagnosis not present

## 2018-03-08 DIAGNOSIS — Z87891 Personal history of nicotine dependence: Secondary | ICD-10-CM | POA: Insufficient documentation

## 2018-03-08 DIAGNOSIS — K0530 Chronic periodontitis, unspecified: Secondary | ICD-10-CM | POA: Diagnosis present

## 2018-03-08 DIAGNOSIS — K029 Dental caries, unspecified: Secondary | ICD-10-CM | POA: Insufficient documentation

## 2018-03-08 DIAGNOSIS — E1140 Type 2 diabetes mellitus with diabetic neuropathy, unspecified: Secondary | ICD-10-CM | POA: Insufficient documentation

## 2018-03-08 DIAGNOSIS — Z7984 Long term (current) use of oral hypoglycemic drugs: Secondary | ICD-10-CM | POA: Insufficient documentation

## 2018-03-08 HISTORY — PX: TOOTH EXTRACTION: SHX859

## 2018-03-08 LAB — BASIC METABOLIC PANEL
Anion gap: 7 (ref 5–15)
BUN: 17 mg/dL (ref 8–23)
CHLORIDE: 103 mmol/L (ref 98–111)
CO2: 28 mmol/L (ref 22–32)
CREATININE: 0.91 mg/dL (ref 0.44–1.00)
Calcium: 9 mg/dL (ref 8.9–10.3)
GFR calc Af Amer: >60 mL/min (ref >60)
GFR calc non Af Amer: >60 mL/min (ref >60)
GLUCOSE: 153 mg/dL — AB (ref 70–99)
Potassium: 2.7 mmol/L — CL (ref 3.5–5.1)
Sodium: 138 mmol/L (ref 135–145)

## 2018-03-08 LAB — CBC
HCT: 39 % (ref 36.0–46.0)
Hemoglobin: 12.1 g/dL (ref 12.0–15.0)
MCH: 26.5 pg (ref 26.0–34.0)
MCHC: 31 g/dL (ref 30.0–36.0)
MCV: 85.5 fL (ref 80.0–100.0)
NRBC: 0.3 % — AB (ref 0.0–0.2)
PLATELETS: 299 10*3/uL (ref 150–400)
RBC: 4.56 MIL/uL (ref 3.87–5.11)
RDW: 12.2 % (ref 11.5–15.5)
WBC: 6.5 10*3/uL (ref 4.0–10.5)

## 2018-03-08 LAB — GLUCOSE, CAPILLARY
GLUCOSE-CAPILLARY: 152 mg/dL — AB (ref 70–99)
Glucose-Capillary: 213 mg/dL — ABNORMAL HIGH (ref 70–99)

## 2018-03-08 SURGERY — DENTAL RESTORATION/EXTRACTIONS
Anesthesia: General | Site: Mouth

## 2018-03-08 MED ORDER — OXYCODONE-ACETAMINOPHEN 5-325 MG PO TABS: 1.0000 | ORAL_TABLET | ORAL | 0 refills | Status: DC | PRN

## 2018-03-08 MED ORDER — SODIUM CHLORIDE 0.9 % IV SOLN
INTRAVENOUS | Status: AC | PRN
  Administered 2018-03-08: 1000 mL

## 2018-03-08 MED ORDER — PROPOFOL 10 MG/ML IV BOLUS
INTRAVENOUS | Status: AC
  Filled 2018-03-08: qty 40

## 2018-03-08 MED ORDER — POTASSIUM CHLORIDE 10 MEQ/100ML IV SOLN
10.0000 meq | INTRAVENOUS | Status: DC
  Administered 2018-03-08: 10 meq via INTRAVENOUS

## 2018-03-08 MED ORDER — FENTANYL CITRATE (PF) 250 MCG/5ML IJ SOLN
INTRAMUSCULAR | Status: DC | PRN
  Administered 2018-03-08: 100 ug via INTRAVENOUS

## 2018-03-08 MED ORDER — DEXAMETHASONE SODIUM PHOSPHATE 10 MG/ML IJ SOLN
INTRAMUSCULAR | Status: AC
  Filled 2018-03-08: qty 1

## 2018-03-08 MED ORDER — OXYMETAZOLINE HCL 0.05 % NA SOLN
NASAL | Status: AC
  Filled 2018-03-08: qty 15

## 2018-03-08 MED ORDER — EPHEDRINE SULFATE 50 MG/ML IJ SOLN
INTRAMUSCULAR | Status: DC | PRN
  Administered 2018-03-08: 5 mg via INTRAVENOUS

## 2018-03-08 MED ORDER — LIDOCAINE-EPINEPHRINE 2 %-1:100000 IJ SOLN
INTRAMUSCULAR | Status: DC | PRN
  Administered 2018-03-08: 18 mL

## 2018-03-08 MED ORDER — OXYCODONE HCL 5 MG/5ML PO SOLN: 5.0000 mg | Freq: Once | ORAL | Status: DC | PRN

## 2018-03-08 MED ORDER — LIDOCAINE 2% (20 MG/ML) 5 ML SYRINGE
INTRAMUSCULAR | Status: DC | PRN
  Administered 2018-03-08: 60 mg via INTRAVENOUS

## 2018-03-08 MED ORDER — PHENYLEPHRINE HCL 10 MG/ML IJ SOLN
INTRAMUSCULAR | Status: DC | PRN
  Administered 2018-03-08 (×2): 80 ug via INTRAVENOUS

## 2018-03-08 MED ORDER — ONDANSETRON HCL 4 MG/2ML IJ SOLN
INTRAMUSCULAR | Status: DC | PRN
  Administered 2018-03-08: 4 mg via INTRAVENOUS

## 2018-03-08 MED ORDER — FENTANYL CITRATE (PF) 250 MCG/5ML IJ SOLN
INTRAMUSCULAR | Status: AC
  Filled 2018-03-08: qty 5

## 2018-03-08 MED ORDER — EPHEDRINE 5 MG/ML INJ
INTRAVENOUS | Status: AC
  Filled 2018-03-08: qty 10

## 2018-03-08 MED ORDER — OXYCODONE HCL 5 MG PO TABS: 5.0000 mg | ORAL_TABLET | Freq: Once | ORAL | Status: DC | PRN

## 2018-03-08 MED ORDER — PROPOFOL 10 MG/ML IV BOLUS
INTRAVENOUS | Status: DC | PRN
  Administered 2018-03-08: 200 mg via INTRAVENOUS

## 2018-03-08 MED ORDER — SUGAMMADEX SODIUM 200 MG/2ML IV SOLN
INTRAVENOUS | Status: DC | PRN
  Administered 2018-03-08: 212.2 mg via INTRAVENOUS

## 2018-03-08 MED ORDER — LACTATED RINGERS IV SOLN
INTRAVENOUS | Status: DC | PRN
  Administered 2018-03-08: 07:00:00 via INTRAVENOUS

## 2018-03-08 MED ORDER — SUCCINYLCHOLINE CHLORIDE 200 MG/10ML IV SOSY
PREFILLED_SYRINGE | INTRAVENOUS | Status: AC
Start: 2018-03-08 — End: ?
  Filled 2018-03-08: qty 10

## 2018-03-08 MED ORDER — DEXAMETHASONE SODIUM PHOSPHATE 10 MG/ML IJ SOLN
INTRAMUSCULAR | Status: DC | PRN
  Administered 2018-03-08: 10 mg via INTRAVENOUS

## 2018-03-08 MED ORDER — GLYCOPYRROLATE 0.2 MG/ML IJ SOLN
INTRAMUSCULAR | Status: DC | PRN
  Administered 2018-03-08: 0.1 mg via INTRAVENOUS

## 2018-03-08 MED ORDER — POTASSIUM CHLORIDE 10 MEQ/100ML IV SOLN
10.0000 meq | INTRAVENOUS | Status: AC
  Administered 2018-03-08: 10 meq via INTRAVENOUS
  Filled 2018-03-08: qty 100

## 2018-03-08 MED ORDER — FENTANYL CITRATE (PF) 100 MCG/2ML IJ SOLN
25.0000 ug | INTRAMUSCULAR | Status: DC | PRN
  Administered 2018-03-08 (×2): 50 ug via INTRAVENOUS

## 2018-03-08 MED ORDER — 0.9 % SODIUM CHLORIDE (POUR BTL) OPTIME
TOPICAL | Status: DC | PRN
  Administered 2018-03-08: 1000 mL

## 2018-03-08 MED ORDER — GLYCOPYRROLATE PF 0.2 MG/ML IJ SOSY
PREFILLED_SYRINGE | INTRAMUSCULAR | Status: AC
  Filled 2018-03-08: qty 1

## 2018-03-08 MED ORDER — FENTANYL CITRATE (PF) 100 MCG/2ML IJ SOLN
INTRAMUSCULAR | Status: AC
  Administered 2018-03-08: 50 ug via INTRAVENOUS
  Filled 2018-03-08: qty 2

## 2018-03-08 MED ORDER — MIDAZOLAM HCL 2 MG/2ML IJ SOLN
INTRAMUSCULAR | Status: AC
  Filled 2018-03-08: qty 2

## 2018-03-08 MED ORDER — ONDANSETRON HCL 4 MG/2ML IJ SOLN
INTRAMUSCULAR | Status: AC
  Filled 2018-03-08: qty 2

## 2018-03-08 MED ORDER — CEFAZOLIN SODIUM-DEXTROSE 2-4 GM/100ML-% IV SOLN
INTRAVENOUS | Status: AC
  Filled 2018-03-08: qty 100

## 2018-03-08 MED ORDER — LIDOCAINE 2% (20 MG/ML) 5 ML SYRINGE
INTRAMUSCULAR | Status: AC
  Filled 2018-03-08: qty 5

## 2018-03-08 MED ORDER — OXYMETAZOLINE HCL 0.05 % NA SOLN
NASAL | Status: AC
  Filled 2018-03-08: qty 45

## 2018-03-08 MED ORDER — LIDOCAINE-EPINEPHRINE 2 %-1:100000 IJ SOLN
INTRAMUSCULAR | Status: AC
  Filled 2018-03-08: qty 3

## 2018-03-08 MED ORDER — CEFAZOLIN SODIUM-DEXTROSE 2-3 GM-%(50ML) IV SOLR
INTRAVENOUS | Status: DC | PRN
  Administered 2018-03-08: 2 g via INTRAVENOUS

## 2018-03-08 MED ORDER — PHENYLEPHRINE 40 MCG/ML (10ML) SYRINGE FOR IV PUSH (FOR BLOOD PRESSURE SUPPORT)
PREFILLED_SYRINGE | INTRAVENOUS | Status: AC
  Filled 2018-03-08: qty 10

## 2018-03-08 MED ORDER — AMOXICILLIN 500 MG PO CAPS: 500.0000 mg | ORAL_CAPSULE | Freq: Three times a day (TID) | ORAL | 0 refills | Status: DC

## 2018-03-08 MED ORDER — ROCURONIUM BROMIDE 50 MG/5ML IV SOSY
PREFILLED_SYRINGE | INTRAVENOUS | Status: DC | PRN
  Administered 2018-03-08: 20 mg via INTRAVENOUS

## 2018-03-08 MED ORDER — MIDAZOLAM HCL 2 MG/2ML IJ SOLN
INTRAMUSCULAR | Status: DC | PRN
  Administered 2018-03-08: 2 mg via INTRAVENOUS

## 2018-03-08 MED ORDER — ONDANSETRON HCL 4 MG/2ML IJ SOLN: 4.0000 mg | Freq: Once | INTRAMUSCULAR | Status: DC | PRN

## 2018-03-08 SURGICAL SUPPLY — 38 items
BLADE SURG 15 STRL LF DISP TIS (BLADE) ×1 IMPLANT
BLADE SURG 15 STRL SS (BLADE) ×2
BUR CROSS CUT FISSURE 1.6 (BURR) ×2 IMPLANT
BUR CROSS CUT FISSURE 1.6MM (BURR) ×1
BUR EGG ELITE 4.0 (BURR) ×2 IMPLANT
BUR EGG ELITE 4.0MM (BURR) ×1
CANISTER SUCT 3000ML PPV (MISCELLANEOUS) ×3 IMPLANT
COVER SURGICAL LIGHT HANDLE (MISCELLANEOUS) ×3 IMPLANT
COVER WAND RF STERILE (DRAPES) ×3 IMPLANT
DECANTER SPIKE VIAL GLASS SM (MISCELLANEOUS) ×3 IMPLANT
DRAPE U-SHAPE 76X120 STRL (DRAPES) ×3 IMPLANT
GAUZE PACKING FOLDED 2  STR (GAUZE/BANDAGES/DRESSINGS) ×2
GAUZE PACKING FOLDED 2 STR (GAUZE/BANDAGES/DRESSINGS) ×1 IMPLANT
GLOVE BIO SURGEON STRL SZ 6.5 (GLOVE) ×2 IMPLANT
GLOVE BIO SURGEON STRL SZ7 (GLOVE) ×2 IMPLANT
GLOVE BIO SURGEON STRL SZ7.5 (GLOVE) ×3 IMPLANT
GLOVE BIO SURGEONS STRL SZ 6.5 (GLOVE) ×2
GLOVE BIOGEL PI IND STRL 6.5 (GLOVE) IMPLANT
GLOVE BIOGEL PI IND STRL 7.0 (GLOVE) IMPLANT
GLOVE BIOGEL PI INDICATOR 6.5 (GLOVE)
GLOVE BIOGEL PI INDICATOR 7.0 (GLOVE)
GOWN STRL REUS W/ TWL LRG LVL3 (GOWN DISPOSABLE) ×1 IMPLANT
GOWN STRL REUS W/ TWL XL LVL3 (GOWN DISPOSABLE) ×1 IMPLANT
GOWN STRL REUS W/TWL LRG LVL3 (GOWN DISPOSABLE) ×2
GOWN STRL REUS W/TWL XL LVL3 (GOWN DISPOSABLE) ×2
IV NS 1000ML (IV SOLUTION) ×2
IV NS 1000ML BAXH (IV SOLUTION) ×1 IMPLANT
KIT BASIN OR (CUSTOM PROCEDURE TRAY) ×3 IMPLANT
KIT TURNOVER KIT B (KITS) ×3 IMPLANT
NEEDLE 22X1 1/2 (OR ONLY) (NEEDLE) ×6 IMPLANT
NS IRRIG 1000ML POUR BTL (IV SOLUTION) ×3 IMPLANT
PAD ARMBOARD 7.5X6 YLW CONV (MISCELLANEOUS) ×5 IMPLANT
SPONGE SURGIFOAM ABS GEL 12-7 (HEMOSTASIS) IMPLANT
SUT CHROMIC 3 0 PS 2 (SUTURE) ×5 IMPLANT
SYR CONTROL 10ML LL (SYRINGE) ×3 IMPLANT
TRAY ENT MC OR (CUSTOM PROCEDURE TRAY) ×3 IMPLANT
TUBING IRRIGATION (MISCELLANEOUS) ×3 IMPLANT
YANKAUER SUCT BULB TIP NO VENT (SUCTIONS) ×3 IMPLANT

## 2018-03-08 NOTE — Anesthesia Procedure Notes (Signed)
Procedure Name: Intubation Date/Time: 03/08/2018 7:51 AM Performed by: Kathryne Hitch, CRNA Pre-anesthesia Checklist: Patient identified, Emergency Drugs available, Suction available, Patient being monitored and Timeout performed Patient Re-evaluated:Patient Re-evaluated prior to induction Oxygen Delivery Method: Circle system utilized Preoxygenation: Pre-oxygenation with 100% oxygen Induction Type: IV induction Ventilation: Mask ventilation without difficulty Laryngoscope Size: Glidescope and 4 (Mac Grade 3 ) Grade View: Grade I Nasal Tubes: Right, Nasal prep performed and Nasal Rae Tube size: 7.0 mm Number of attempts: 2 Placement Confirmation: ETT inserted through vocal cords under direct vision,  positive ETCO2 and breath sounds checked- equal and bilateral Tube secured with: Tape Dental Injury: Teeth and Oropharynx as per pre-operative assessment  Difficulty Due To: Difficult Airway- due to anterior larynx

## 2018-03-08 NOTE — H&P (Signed)
H&P documentation  -History and Physical Reviewed  -Patient has been re-examined  -No change in the plan of care  Tara Summers  

## 2018-03-08 NOTE — Transfer of Care (Signed)
Immediate Anesthesia Transfer of Care Note  Patient: Tara Summers  Procedure(s) Performed: DENTAL EXTRACTIONS number 3, 4, 6, 11, 12, 14, 18, alveoloplasty (N/A Mouth)  Patient Location: PACU  Anesthesia Type:General  Level of Consciousness: awake, alert  and drowsy  Airway & Oxygen Therapy: Patient Spontanous Breathing and Patient connected to face mask oxygen  Post-op Assessment: Report given to RN, Post -op Vital signs reviewed and stable and Patient moving all extremities X 4  Post vital signs: Reviewed and stable  Last Vitals:  Vitals Value Taken Time  BP 181/79 03/08/2018  8:52 AM  Temp 36.3 C 03/08/2018  8:52 AM  Pulse 89 03/08/2018  8:54 AM  Resp 26 03/08/2018  8:54 AM  SpO2 97 % 03/08/2018  8:54 AM  Vitals shown include unvalidated device data.  Last Pain:  Vitals:   03/08/18 0654  TempSrc:   PainSc: 0-No pain         Complications: No apparent anesthesia complications

## 2018-03-08 NOTE — Op Note (Signed)
03/08/2018  8:34 AM  PATIENT:  Tara Summers  67 y.o. female  PRE-OPERATIVE DIAGNOSIS:  NON-RESTORABLE TEETH # 3, 4, 6, 11, 12, 14, 18  POST-OPERATIVE DIAGNOSIS:  SAME  PROCEDURE:  Procedure(s): EXTRACTION TEETH # 3, 4, 6, 11, 12, 14, 18, ALVEOLOPLASTY RIGHT AND LEFT MAXILLA  SURGEON:  Surgeon(s): Diona Browner, DDS  ANESTHESIA:   local and general  EBL:  minimal  DRAINS: none   SPECIMEN:  No Specimen  COUNTS:  YES  PLAN OF CARE: Discharge to home after PACU  PATIENT DISPOSITION:  PACU - hemodynamically stable.   PROCEDURE DETAILS: Dictation # NO NUMBER GIVEN [name], DMD 03/08/2018 8:34 AM

## 2018-03-08 NOTE — Anesthesia Postprocedure Evaluation (Signed)
Anesthesia Post Note  Patient: [name]  Procedure(s) Performed: DENTAL EXTRACTIONS number 3, 4, 6, 11, 12, 14, 18, alveoloplasty (N/A Mouth)     Patient location during evaluation: PACU Anesthesia Type: General Level of consciousness: awake and alert Pain management: pain level controlled Vital Signs Assessment: post-procedure vital signs reviewed and stable Respiratory status: spontaneous breathing, nonlabored ventilation and respiratory function stable Cardiovascular status: blood pressure returned to baseline and stable Postop Assessment: no apparent nausea or vomiting Anesthetic complications: no    Last Vitals:  Vitals:   03/08/18 1052 03/08/18 1103  BP: (!) 151/88 (!) 168/85  Pulse: 69 82  Resp: 20 19  Temp:  (!) 36.3 C  SpO2: 100% 100%    Last Pain:  Vitals:   03/08/18 1103  TempSrc:   PainSc: 2                  Lidia Collum

## 2018-03-08 NOTE — Op Note (Signed)
NAME: Tara Summers, Tara Summers MEDICAL RECORD [id number] ACCOUNT [id number] DATE OF BIRTH:[birth year] FACILITY: [facility] LOCATION: [facility] PHYSICIAN:Alexus Michael M. Jaqualyn Juday, DDS  OPERATIVE REPORT  DATE OF PROCEDURE:  03/08/2018  PREOPERATIVE DIAGNOSIS:  Nonrestorable teeth secondary to dental caries, periodontitis numbers 3, 4, 6, 11, 12, 14 and 18.  POSTOPERATIVE DIAGNOSIS:  Nonrestorable teeth secondary to dental caries, periodontitis numbers 3, 4, 6, 11, 12, 14 and 18.  PROCEDURE:  Extraction of teeth numbers 3, 4, 6, 11, 12, 14, 18, alveoplasty right and left maxilla.  SURGEON:  Diona Browner, DDS  ANESTHESIA:  General nasal intubation.  DESCRIPTION OF PROCEDURE:  The patient was taken to the operating room and placed on the table in supine position.  General anesthesia was administered intravenously and a nasal endotracheal tube was placed and secured.  The eyes were protected and the  patient was draped for surgery.  A timeout was performed.  The posterior pharynx was suctioned and a throat pack was placed, 2% lidocaine 1:100,000 epinephrine was infiltrated in the left inferior alveolar block and in buccal and palatal infiltration in  the maxilla, a total of 18 mL was utilized.  A bite block was placed on the right side of the mouth.  The left side was operated first.  A #15 blade used to make an incision around tooth 18.  The periosteum was reflected.  The tooth was elevated and  attempted removal was done with forceps; however, the crown fractured leaving the roots.  Then, the bone was removed from around the roots and the tooth was sectioned and the roots were removed individually with a 301 elevator.  Then, the socket was  irrigated, curetted, and closed with 3-0 chromic.  In the maxilla a #15 blade used to make an incision around teeth 14, 12 and 11 with a proximal and distal extension along the alveolar ridge of approximately 1 cm.  The periosteum was reflected from  around these teeth.  The  teeth were elevated with a 301 elevator forceps was used to remove teeth 14, 12 and 11.  However, 14 and 12 fracture during attempted removal and root tip was left in each of these teeth.  The periosteum was reflected.  Bone was  removed around tooth 14 distal buccal root and then the root was removed using the root tip pick and the rongeurs.  Then, bone was removed around the buccal root of 12 and this root was removed with rongeurs.  Tooth 11 had no root fractures.  The  periosteum was reflected to expose the alveolar crest.  The lateral border was regular because of the bone formation and then alveoplasty was then performed using an egg-shaped bur and bone file.  Then, the area was irrigated and closed with 3-0 chromic.   Then, the bite block was repositioned to the other side of the mouth.  A #15 blade was used to make an incision around teeth 3, 4 and 6 with a proximal and distal extension on the alveolar crest of 1 cm.  A wedge was excised in these extensions to  allow for primary closure.  Then, the periosteum was reflected.  The teeth were elevated with a 301 elevator and removed from the mouth with the dental forceps.  The sockets were then curetted.  The periosteum was further reflected to expose the alveolar  ridge and lateral aspect.  Alveoplasty was then performed using an egg-shaped bur and bone file to smooth the bone.  Then the area was irrigated and closed with  3-0 chromic and the oral cavity was irrigated and suctioned and the throat pack was removed.   The patient was left in care of anesthesia for extubation and transport to recovery room with plans for discharge home through day surgery.  ESTIMATED BLOOD LOSS:  Minimum.  COMPLICATIONS:  None.  SPECIMENS:  None.  TN/NUANCE  D:03/08/2018 T:03/08/2018 JOB:[id number]

## 2018-03-08 NOTE — Progress Notes (Signed)
Per Dr. Kerin Perna, patient needs 2 runs of potassium IV

## 2018-03-08 NOTE — Discharge Instructions (Signed)
Follow up with PCP in reference to potassium level.

## 2018-03-09 ENCOUNTER — Encounter (HOSPITAL_COMMUNITY): Payer: Self-pay | Admitting: Oral Surgery

## 2018-06-19 ENCOUNTER — Telehealth: Payer: Self-pay | Admitting: *Deleted

## 2018-06-19 ENCOUNTER — Encounter: Payer: Self-pay | Admitting: Women's Health

## 2018-06-19 ENCOUNTER — Ambulatory Visit: Payer: Medicare Other | Admitting: Women's Health

## 2018-06-19 VITALS — BP 124/80 | Ht 61.0 in | Wt 230.0 lb

## 2018-06-19 DIAGNOSIS — Z853 Personal history of malignant neoplasm of breast: Secondary | ICD-10-CM

## 2018-06-19 DIAGNOSIS — Z01419 Encounter for gynecological examination (general) (routine) without abnormal findings: Secondary | ICD-10-CM | POA: Diagnosis not present

## 2018-06-19 DIAGNOSIS — R350 Frequency of micturition: Secondary | ICD-10-CM

## 2018-06-19 DIAGNOSIS — Z9189 Other specified personal risk factors, not elsewhere classified: Secondary | ICD-10-CM

## 2018-06-19 DIAGNOSIS — Z9289 Personal history of other medical treatment: Secondary | ICD-10-CM | POA: Diagnosis not present

## 2018-06-19 DIAGNOSIS — N898 Other specified noninflammatory disorders of vagina: Secondary | ICD-10-CM

## 2018-06-19 LAB — WET PREP FOR TRICH, YEAST, CLUE

## 2018-06-19 NOTE — Addendum Note (Signed)
Addended by: Lorine Bears on: 06/19/2018 12:55 PM   Modules accepted: Orders

## 2018-06-19 NOTE — Telephone Encounter (Signed)
-----   Message from Huel Cote, NP sent at 06/19/2018 11:43 AM EST ----- Please schedule genetic counseling, 2008 right breast cancer at age 68.  Lumpectomy and radiation.  She does have 5 daughters.  No other family history of breast cancer but does have an uncle with throat cancer.

## 2018-06-19 NOTE — Progress Notes (Signed)
Tara Summers [birth year] [id number]    History:    Presents for breast and pelvic exam.  TVH with BSO for fibroids on no HRT.  Normal Pap history.  11/2006 right breast cancer lumpectomy and radiation.  Normal mammograms since.  Has not had BRCA testing.  2017 benign colon polyp 5-year follow-up.  2019 T score -1.3 at femoral neck FRAX 2.4% / 0.2%.  Primary care manages hypertension, hypercholesteremia, diabetes, GERD.  Currently having some pedal edema, throat issues, negative barium swallow.  Current on vaccines.  Past medical history, past surgical history, family history and social history were all reviewed and documented in the EPIC chart.  5 daughters and 1 adopted daughter and 5 grandchildren.  Ministry work.  ROS:  A ROS was performed and pertinent positives and negatives are included.  Exam:  Vitals:   06/19/18 1054  BP: 124/80  Weight: 230 lb (104.3 kg)  Height: 5' 1" (1.549 m)   Body mass index is 43.46 kg/m.   General appearance:  Normal Thyroid:  Symmetrical, normal in size, without palpable masses or nodularity. Respiratory  Auscultation:  Clear without wheezing or rhonchi Cardiovascular  Auscultation:  Regular rate, without rubs, murmurs or gallops  Edema/varicosities:  Not grossly evident Abdominal  Soft,nontender, without masses, guarding or rebound.  Liver/spleen:  No organomegaly noted  Hernia:  None appreciated  Skin  Inspection:  Grossly normal   Breasts: Examined lying and sitting.     Right: Without masses, retractions, discharge or axillary adenopathy.     Left: Without masses, retractions, discharge or axillary adenopathy. Gentitourinary   Inguinal/mons:  Normal without inguinal adenopathy  External genitalia:  Normal  BUS/Urethra/Skene's glands:  Normal  Vagina:  Normal  Cervix: And uterus absent  Adnexa/parametria:     Rt: Without masses or tenderness.   Lt: Without masses or tenderness.  Anus and perineum: Normal  Digital rectal  exam: Normal sphincter tone without palpated masses or tenderness  Assessment/Plan:  68 y.o. MBF G7, P5 +1 adopted daughter for breast and pelvic exam with occasional urinary symptoms of pain, burning and increased frequency  TVH with BSO for fibroids 2008 right breast cancer lumpectomy/radiation Osteopenia without elevated FRAX Hypertension, diabetes, hypercholesteremia,-primary care manages labs and meds GERD-GI managing Obesity  Plan: We will schedule genetic counseling for possible BRCA testing/5 daughters.  Encouraged to follow-up with primary care or GI for continued swallowing/throat/questionable GERD issues.  Instructed to take blood pressure daily and follow-up with primary care having increased pedal edema.  Reviewed importance of avoiding processed foods, added salt, fried foods.  Yoga encouraged as well as weightbearing exercise daily.  Home safety, fall prevention discussed.  Decrease calorie/carbs encouraged.  UA pending.    [place], 11:30 AM 06/19/2018

## 2018-06-19 NOTE — Patient Instructions (Signed)
Shingles/shingrex  Carbohydrate Counting for Diabetes Mellitus, Adult  Carbohydrate counting is a method of keeping track of how many carbohydrates you eat. Eating carbohydrates naturally increases the amount of sugar (glucose) in the blood. Counting how many carbohydrates you eat helps keep your blood glucose within normal limits, which helps you manage your diabetes (diabetes mellitus). It is important to know how many carbohydrates you can safely have in each meal. This is different for every person. A diet and nutrition specialist (registered dietitian) can help you make a meal plan and calculate how many carbohydrates you should have at each meal and snack. Carbohydrates are found in the following foods:  Grains, such as breads and cereals.  Dried beans and soy products.  Starchy vegetables, such as potatoes, peas, and corn.  Fruit and fruit juices.  Milk and yogurt.  Sweets and snack foods, such as cake, cookies, candy, chips, and soft drinks. How do I count carbohydrates? There are two ways to count carbohydrates in food. You can use either of the methods or a combination of both. Reading "Nutrition Facts" on packaged food The "Nutrition Facts" list is included on the labels of almost all packaged foods and beverages in the [place] It includes:  The serving size.  Information about nutrients in each serving, including the grams (g) of carbohydrate per serving. To use the "Nutrition Facts":  Decide how many servings you will have.  Multiply the number of servings by the number of carbohydrates per serving.  The resulting number is the total amount of carbohydrates that you will be having. Learning standard serving sizes of other foods When you eat carbohydrate foods that are not packaged or do not include "Nutrition Facts" on the label, you need to measure the servings in order to count the amount of carbohydrates:  Measure the foods that you will eat with a food scale or  measuring cup, if needed.  Decide how many standard-size servings you will eat.  Multiply the number of servings by 15. Most carbohydrate-rich foods have about 15 g of carbohydrates per serving. ? For example, if you eat 8 oz (170 g) of strawberries, you will have eaten 2 servings and 30 g of carbohydrates (2 servings x 15 g = 30 g).  For foods that have more than one food mixed, such as soups and casseroles, you must count the carbohydrates in each food that is included. The following list contains standard serving sizes of common carbohydrate-rich foods. Each of these servings has about 15 g of carbohydrates:   hamburger bun or  English muffin.   oz (15 mL) syrup.   oz (14 g) jelly.  1 slice of bread.  1 six-inch tortilla.  3 oz (85 g) cooked rice or pasta.  4 oz (113 g) cooked dried beans.  4 oz (113 g) starchy vegetable, such as peas, corn, or potatoes.  4 oz (113 g) hot cereal.  4 oz (113 g) mashed potatoes or  of a large baked potato.  4 oz (113 g) canned or frozen fruit.  4 oz (120 mL) fruit juice.  4-6 crackers.  6 chicken nuggets.  6 oz (170 g) unsweetened dry cereal.  6 oz (170 g) plain fat-free yogurt or yogurt sweetened with artificial sweeteners.  8 oz (240 mL) milk.  8 oz (170 g) fresh fruit or one small piece of fruit.  24 oz (680 g) popped popcorn. Example of carbohydrate counting Sample meal  3 oz (85 g) chicken breast.  6 oz (  170 g) brown rice.  4 oz (113 g) corn.  8 oz (240 mL) milk.  8 oz (170 g) strawberries with sugar-free whipped topping. Carbohydrate calculation 1. Identify the foods that contain carbohydrates: ? Rice. ? Corn. ? Milk. ? Strawberries. 2. Calculate how many servings you have of each food: ? 2 servings rice. ? 1 serving corn. ? 1 serving milk. ? 1 serving strawberries. 3. Multiply each number of servings by 15 g: ? 2 servings rice x 15 g = 30 g. ? 1 serving corn x 15 g = 15 g. ? 1 serving milk x 15 g =  15 g. ? 1 serving strawberries x 15 g = 15 g. 4. Add together all of the amounts to find the total grams of carbohydrates eaten: ? 30 g + 15 g + 15 g + 15 g = 75 g of carbohydrates total. Summary  Carbohydrate counting is a method of keeping track of how many carbohydrates you eat.  Eating carbohydrates naturally increases the amount of sugar (glucose) in the blood.  Counting how many carbohydrates you eat helps keep your blood glucose within normal limits, which helps you manage your diabetes.  A diet and nutrition specialist (registered dietitian) can help you make a meal plan and calculate how many carbohydrates you should have at each meal and snack. This information is not intended to replace advice given to you by your health care provider. Make sure you discuss any questions you have with your health care provider. Document Released: 04/17/2005 Document Revised: 10/25/2016 Document Reviewed: 09/29/2015 Elsevier Interactive Patient Education  2019 [place] Maintenance After Age 66 After age 66, you are at a higher risk for certain long-term diseases and infections as well as injuries from falls. Falls are a major cause of broken bones and head injuries in people who are older than age 66. Getting regular preventive care can help to keep you healthy and well. Preventive care includes getting regular testing and making lifestyle changes as recommended by your health care provider. Talk with your health care provider about:  Which screenings and tests you should have. A screening is a test that checks for a disease when you have no symptoms.  A diet and exercise plan that is right for you. What should I know about screenings and tests to prevent falls? Screening and testing are the best ways to find a health problem early. Early diagnosis and treatment give you the best chance of managing medical conditions that are common after age 66. Certain conditions and lifestyle  choices may make you more likely to have a fall. Your health care provider may recommend:  Regular vision checks. Poor vision and conditions such as cataracts can make you more likely to have a fall. If you wear glasses, make sure to get your prescription updated if your vision changes.  Medicine review. Work with your health care provider to regularly review all of the medicines you are taking, including over-the-counter medicines. Ask your health care provider about any side effects that may make you more likely to have a fall. Tell your health care provider if any medicines that you take make you feel dizzy or sleepy.  Osteoporosis screening. Osteoporosis is a condition that causes the bones to get weaker. This can make the bones weak and cause them to break more easily.  Blood pressure screening. Blood pressure changes and medicines to control blood pressure can make you feel dizzy.  Strength and balance checks. Your  health care provider may recommend certain tests to check your strength and balance while standing, walking, or changing positions.  Foot health exam. Foot pain and numbness, as well as not wearing proper footwear, can make you more likely to have a fall.  Depression screening. You may be more likely to have a fall if you have a fear of falling, feel emotionally low, or feel unable to do activities that you used to do.  Alcohol use screening. Using too much alcohol can affect your balance and may make you more likely to have a fall. What actions can I take to lower my risk of falls? General instructions  Talk with your health care provider about your risks for falling. Tell your health care provider if: ? You fall. Be sure to tell your health care provider about all falls, even ones that seem minor. ? You feel dizzy, sleepy, or off-balance.  Take over-the-counter and prescription medicines only as told by your health care provider. These include any supplements.  Eat a  healthy diet and maintain a healthy weight. A healthy diet includes low-fat dairy products, low-fat (lean) meats, and fiber from whole grains, beans, and lots of fruits and vegetables. Home safety  Remove any tripping hazards, such as rugs, cords, and clutter.  Install safety equipment such as grab bars in bathrooms and safety rails on stairs.  Keep rooms and walkways well-lit. Activity   Follow a regular exercise program to stay fit. This will help you maintain your balance. Ask your health care provider what types of exercise are appropriate for you.  If you need a cane or walker, use it as recommended by your health care provider.  Wear supportive shoes that have nonskid soles. Lifestyle  Do not drink alcohol if your health care provider tells you not to drink.  If you drink alcohol, limit how much you have: ? 0-1 drink a day for women. ? 0-2 drinks a day for men.  Be aware of how much alcohol is in your drink. In the [place] one drink equals one typical bottle of beer (12 oz), one-half glass of wine (5 oz), or one shot of hard liquor (1 oz).  Do not use any products that contain nicotine or tobacco, such as cigarettes and e-cigarettes. If you need help quitting, ask your health care provider. Summary  Having a healthy lifestyle and getting preventive care can help to protect your health and wellness after age 4.  Screening and testing are the best way to find a health problem early and help you avoid having a fall. Early diagnosis and treatment give you the best chance for managing medical conditions that are more common for people who are older than age 33.  Falls are a major cause of broken bones and head injuries in people who are older than age 25. Take precautions to prevent a fall at home.  Work with your health care provider to learn what changes you can make to improve your health and wellness and to prevent falls. This information is not intended to replace advice  given to you by your health care provider. Make sure you discuss any questions you have with your health care provider. Document Released: 02/28/2017 Document Revised: 02/28/2017 Document Reviewed: 02/28/2017 Elsevier Interactive Patient Education  2019 [employer].

## 2018-06-19 NOTE — Telephone Encounter (Signed)
Referral placed at [facility], they will call to schedule.

## 2018-06-20 ENCOUNTER — Encounter: Payer: Self-pay | Admitting: Genetic Counselor

## 2018-06-20 ENCOUNTER — Telehealth: Payer: Self-pay | Admitting: Genetic Counselor

## 2018-06-20 NOTE — Telephone Encounter (Signed)
A genetic counseling appt has been scheduled for the pt to see Leda Quail on 3/12 at 1230pm. Letter mailed to the pt.

## 2018-06-21 LAB — URINALYSIS, COMPLETE W/RFL CULTURE
Bacteria, UA: NONE SEEN /HPF
Bilirubin Urine: NEGATIVE
GLUCOSE, UA: NEGATIVE
HYALINE CAST: NONE SEEN /LPF
Ketones, ur: NEGATIVE
Leukocyte Esterase: NEGATIVE
Nitrites, Initial: NEGATIVE
PH: 7 (ref 5.0–8.0)
Specific Gravity, Urine: 1.017 (ref 1.001–1.03)

## 2018-06-21 LAB — URINE CULTURE
MICRO NUMBER:: 220640
SPECIMEN QUALITY: ADEQUATE

## 2018-06-21 LAB — CULTURE INDICATED

## 2018-06-24 NOTE — Telephone Encounter (Signed)
Appointment on 07/11/18 @  3:30pm

## 2018-07-11 ENCOUNTER — Other Ambulatory Visit: Payer: Medicare Other

## 2018-07-22 ENCOUNTER — Telehealth: Payer: Self-pay | Admitting: Genetic Counselor

## 2018-07-22 NOTE — Telephone Encounter (Signed)
Tried home, mobile and emergency contact phone number.  None reached the person intended.

## 2018-07-25 ENCOUNTER — Inpatient Hospital Stay: Payer: Medicare Other

## 2018-10-08 ENCOUNTER — Telehealth: Payer: Self-pay | Admitting: Genetic Counselor

## 2018-10-08 NOTE — Telephone Encounter (Signed)
Called patient regarding upcoming Webex appointment, patient does not have voicemail set up so due to no communication to set up Webex this will be a walk-in visit.

## 2018-10-09 ENCOUNTER — Inpatient Hospital Stay: Payer: Medicare Other | Attending: Genetic Counselor | Admitting: Genetic Counselor

## 2018-10-09 ENCOUNTER — Inpatient Hospital Stay: Payer: Medicare Other

## 2018-10-09 ENCOUNTER — Other Ambulatory Visit: Payer: Self-pay | Admitting: Genetic Counselor

## 2018-10-09 DIAGNOSIS — C50911 Malignant neoplasm of unspecified site of right female breast: Secondary | ICD-10-CM

## 2018-10-16 ENCOUNTER — Other Ambulatory Visit: Payer: Self-pay | Admitting: Cardiovascular Disease

## 2018-10-16 ENCOUNTER — Ambulatory Visit
Admission: RE | Admit: 2018-10-16 | Discharge: 2018-10-16 | Disposition: A | Discharge status: Home / Self Care | Payer: Medicare Other | Source: Ambulatory Visit | Attending: Cardiovascular Disease | Admitting: Cardiovascular Disease

## 2018-10-16 DIAGNOSIS — R079 Chest pain, unspecified: Secondary | ICD-10-CM

## 2018-10-16 DIAGNOSIS — R0602 Shortness of breath: Secondary | ICD-10-CM

## 2018-12-24 ENCOUNTER — Other Ambulatory Visit: Payer: Self-pay

## 2018-12-25 ENCOUNTER — Ambulatory Visit: Payer: Medicare Other | Admitting: Women's Health

## 2019-01-28 ENCOUNTER — Other Ambulatory Visit: Payer: Self-pay

## 2019-01-28 DIAGNOSIS — Z20822 Contact with and (suspected) exposure to covid-19: Secondary | ICD-10-CM

## 2019-01-29 LAB — NOVEL CORONAVIRUS, NAA: SARS-CoV-2, NAA: NOT DETECTED

## 2019-02-05 ENCOUNTER — Encounter: Payer: Self-pay | Admitting: Gynecology

## 2019-03-14 ENCOUNTER — Other Ambulatory Visit: Payer: Self-pay

## 2019-03-14 DIAGNOSIS — Z20822 Contact with and (suspected) exposure to covid-19: Secondary | ICD-10-CM

## 2019-03-17 LAB — NOVEL CORONAVIRUS, NAA: SARS-CoV-2, NAA: NOT DETECTED

## 2019-03-18 ENCOUNTER — Telehealth: Payer: Self-pay | Admitting: General Practice

## 2019-03-18 NOTE — Telephone Encounter (Signed)
Gave patient negative covid test results Patient understood 

## 2019-06-23 ENCOUNTER — Other Ambulatory Visit: Payer: Self-pay

## 2019-06-25 ENCOUNTER — Encounter: Payer: Medicare Other | Admitting: Women's Health

## 2019-06-25 DIAGNOSIS — Z0289 Encounter for other administrative examinations: Secondary | ICD-10-CM

## 2019-07-18 ENCOUNTER — Other Ambulatory Visit: Payer: Self-pay

## 2019-07-18 ENCOUNTER — Inpatient Hospital Stay (HOSPITAL_COMMUNITY)
Admission: AD | Admit: 2019-07-18 | Discharge: 2019-07-22 | DRG: 287 | Disposition: A | Discharge status: Home / Self Care | Payer: Medicare PPO | Attending: Cardiovascular Disease | Admitting: Cardiovascular Disease

## 2019-07-18 ENCOUNTER — Inpatient Hospital Stay (HOSPITAL_COMMUNITY): Payer: Medicare PPO

## 2019-07-18 DIAGNOSIS — E119 Type 2 diabetes mellitus without complications: Secondary | ICD-10-CM | POA: Diagnosis present

## 2019-07-18 DIAGNOSIS — E876 Hypokalemia: Secondary | ICD-10-CM | POA: Diagnosis not present

## 2019-07-18 DIAGNOSIS — I890 Lymphedema, not elsewhere classified: Secondary | ICD-10-CM | POA: Diagnosis present

## 2019-07-18 DIAGNOSIS — I509 Heart failure, unspecified: Secondary | ICD-10-CM

## 2019-07-18 DIAGNOSIS — I5041 Acute combined systolic (congestive) and diastolic (congestive) heart failure: Secondary | ICD-10-CM | POA: Diagnosis present

## 2019-07-18 DIAGNOSIS — M1990 Unspecified osteoarthritis, unspecified site: Secondary | ICD-10-CM | POA: Diagnosis present

## 2019-07-18 DIAGNOSIS — G8929 Other chronic pain: Secondary | ICD-10-CM | POA: Diagnosis present

## 2019-07-18 DIAGNOSIS — Z7984 Long term (current) use of oral hypoglycemic drugs: Secondary | ICD-10-CM | POA: Diagnosis not present

## 2019-07-18 DIAGNOSIS — Z981 Arthrodesis status: Secondary | ICD-10-CM | POA: Diagnosis not present

## 2019-07-18 DIAGNOSIS — Z79899 Other long term (current) drug therapy: Secondary | ICD-10-CM

## 2019-07-18 DIAGNOSIS — M8580 Other specified disorders of bone density and structure, unspecified site: Secondary | ICD-10-CM | POA: Diagnosis present

## 2019-07-18 DIAGNOSIS — I10 Essential (primary) hypertension: Secondary | ICD-10-CM | POA: Diagnosis present

## 2019-07-18 DIAGNOSIS — Z20822 Contact with and (suspected) exposure to covid-19: Secondary | ICD-10-CM | POA: Diagnosis present

## 2019-07-18 DIAGNOSIS — G4730 Sleep apnea, unspecified: Secondary | ICD-10-CM | POA: Diagnosis present

## 2019-07-18 DIAGNOSIS — Z6841 Body Mass Index (BMI) 40.0 and over, adult: Secondary | ICD-10-CM | POA: Diagnosis not present

## 2019-07-18 DIAGNOSIS — K5900 Constipation, unspecified: Secondary | ICD-10-CM | POA: Diagnosis present

## 2019-07-18 DIAGNOSIS — I110 Hypertensive heart disease with heart failure: Principal | ICD-10-CM | POA: Diagnosis present

## 2019-07-18 DIAGNOSIS — Z87891 Personal history of nicotine dependence: Secondary | ICD-10-CM | POA: Diagnosis not present

## 2019-07-18 DIAGNOSIS — K219 Gastro-esophageal reflux disease without esophagitis: Secondary | ICD-10-CM | POA: Diagnosis present

## 2019-07-18 DIAGNOSIS — Z8249 Family history of ischemic heart disease and other diseases of the circulatory system: Secondary | ICD-10-CM | POA: Diagnosis not present

## 2019-07-18 DIAGNOSIS — Z853 Personal history of malignant neoplasm of breast: Secondary | ICD-10-CM | POA: Diagnosis not present

## 2019-07-18 DIAGNOSIS — I5021 Acute systolic (congestive) heart failure: Secondary | ICD-10-CM

## 2019-07-18 DIAGNOSIS — M549 Dorsalgia, unspecified: Secondary | ICD-10-CM | POA: Diagnosis present

## 2019-07-18 DIAGNOSIS — E8809 Other disorders of plasma-protein metabolism, not elsewhere classified: Secondary | ICD-10-CM | POA: Diagnosis present

## 2019-07-18 DIAGNOSIS — R0602 Shortness of breath: Secondary | ICD-10-CM | POA: Diagnosis present

## 2019-07-18 LAB — COMPREHENSIVE METABOLIC PANEL
ALT: 20 U/L (ref 0–44)
AST: 18 U/L (ref 15–41)
Albumin: 2.1 g/dL — ABNORMAL LOW (ref 3.5–5.0)
Alkaline Phosphatase: 61 U/L (ref 38–126)
Anion gap: 11 (ref 5–15)
BUN: 8 mg/dL (ref 8–23)
CO2: 33 mmol/L — ABNORMAL HIGH (ref 22–32)
Calcium: 8.6 mg/dL — ABNORMAL LOW (ref 8.9–10.3)
Chloride: 99 mmol/L (ref 98–111)
Creatinine, Ser: 0.89 mg/dL (ref 0.44–1.00)
GFR calc Af Amer: >60 mL/min (ref >60)
GFR calc non Af Amer: >60 mL/min (ref >60)
Glucose, Bld: 140 mg/dL — ABNORMAL HIGH (ref 70–99)
Potassium: 3 mmol/L — ABNORMAL LOW (ref 3.5–5.1)
Sodium: 143 mmol/L (ref 135–145)
Total Bilirubin: 0.6 mg/dL (ref 0.3–1.2)
Total Protein: 5.6 g/dL — ABNORMAL LOW (ref 6.5–8.1)

## 2019-07-18 LAB — TROPONIN I (HIGH SENSITIVITY)
Troponin I (High Sensitivity): 10 ng/L (ref <18)
Troponin I (High Sensitivity): 10 ng/L (ref <18)

## 2019-07-18 LAB — GLUCOSE, CAPILLARY
Glucose-Capillary: 121 mg/dL — ABNORMAL HIGH (ref 70–99)
Glucose-Capillary: 178 mg/dL — ABNORMAL HIGH (ref 70–99)

## 2019-07-18 LAB — CBC WITH DIFFERENTIAL/PLATELET
Abs Immature Granulocytes: 0.01 10*3/uL (ref 0.00–0.07)
Basophils Absolute: 0 10*3/uL (ref 0.0–0.1)
Basophils Relative: 1 %
Eosinophils Absolute: 0.4 10*3/uL (ref 0.0–0.5)
Eosinophils Relative: 6 %
HCT: 38.6 % (ref 36.0–46.0)
Hemoglobin: 12.6 g/dL (ref 12.0–15.0)
Immature Granulocytes: 0 %
Lymphocytes Relative: 40 %
Lymphs Abs: 2.7 10*3/uL (ref 0.7–4.0)
MCH: 28.6 pg (ref 26.0–34.0)
MCHC: 32.6 g/dL (ref 30.0–36.0)
MCV: 87.5 fL (ref 80.0–100.0)
Monocytes Absolute: 0.5 10*3/uL (ref 0.1–1.0)
Monocytes Relative: 8 %
Neutro Abs: 3.1 10*3/uL (ref 1.7–7.7)
Neutrophils Relative %: 45 %
Platelets: 282 10*3/uL (ref 150–400)
RBC: 4.41 MIL/uL (ref 3.87–5.11)
RDW: 12.6 % (ref 11.5–15.5)
WBC: 6.7 10*3/uL (ref 4.0–10.5)
nRBC: 0 % (ref 0.0–0.2)

## 2019-07-18 LAB — HEMOGLOBIN A1C
Hgb A1c MFr Bld: 6.1 % — ABNORMAL HIGH (ref 4.8–5.6)
Mean Plasma Glucose: 128.37 mg/dL

## 2019-07-18 LAB — BRAIN NATRIURETIC PEPTIDE: B Natriuretic Peptide: 95 pg/mL (ref 0.0–100.0)

## 2019-07-18 LAB — HIV ANTIBODY (ROUTINE TESTING W REFLEX): HIV Screen 4th Generation wRfx: NONREACTIVE

## 2019-07-18 MED ORDER — INSULIN ASPART 100 UNIT/ML ~~LOC~~ SOLN
0.0000 [IU] | Freq: Three times a day (TID) | SUBCUTANEOUS | Status: DC
  Administered 2019-07-19 (×2): 2 [IU] via SUBCUTANEOUS
  Administered 2019-07-20: 3 [IU] via SUBCUTANEOUS
  Administered 2019-07-20 – 2019-07-22 (×6): 2 [IU] via SUBCUTANEOUS

## 2019-07-18 MED ORDER — IRBESARTAN 300 MG PO TABS
300.0000 mg | ORAL_TABLET | Freq: Every day | ORAL | Status: DC
  Administered 2019-07-18 – 2019-07-22 (×5): 300 mg via ORAL
  Filled 2019-07-18 (×4): qty 1

## 2019-07-18 MED ORDER — SODIUM CHLORIDE 0.9% FLUSH: 3.0000 mL | INTRAVENOUS | Status: DC | PRN

## 2019-07-18 MED ORDER — METFORMIN HCL ER 500 MG PO TB24: 500.0000 mg | ORAL_TABLET | Freq: Every day | ORAL | Status: DC

## 2019-07-18 MED ORDER — SODIUM CHLORIDE 0.9 % IV SOLN: 250.0000 mL | INTRAVENOUS | Status: DC | PRN

## 2019-07-18 MED ORDER — SODIUM CHLORIDE 0.9% FLUSH: 10.0000 mL | INTRAVENOUS | Status: DC | PRN

## 2019-07-18 MED ORDER — POLYETHYLENE GLYCOL 3350 17 G PO PACK: 17.0000 g | PACK | Freq: Every day | ORAL | Status: DC | PRN

## 2019-07-18 MED ORDER — METOPROLOL SUCCINATE ER 25 MG PO TB24
25.0000 mg | ORAL_TABLET | Freq: Every day | ORAL | Status: DC
  Administered 2019-07-19 – 2019-07-22 (×4): 25 mg via ORAL
  Filled 2019-07-18 (×4): qty 1

## 2019-07-18 MED ORDER — AMLODIPINE BESYLATE 10 MG PO TABS
10.0000 mg | ORAL_TABLET | Freq: Every day | ORAL | Status: DC
  Administered 2019-07-18 – 2019-07-22 (×5): 10 mg via ORAL
  Filled 2019-07-18 (×5): qty 1

## 2019-07-18 MED ORDER — ACETAMINOPHEN 325 MG PO TABS
650.0000 mg | ORAL_TABLET | ORAL | Status: DC | PRN
  Administered 2019-07-21: 10:00:00 650 mg via ORAL
  Filled 2019-07-18: qty 2

## 2019-07-18 MED ORDER — SODIUM CHLORIDE 0.9% FLUSH
3.0000 mL | Freq: Two times a day (BID) | INTRAVENOUS | Status: DC
  Administered 2019-07-18 – 2019-07-20 (×4): 3 mL via INTRAVENOUS

## 2019-07-18 MED ORDER — ONDANSETRON HCL 4 MG/2ML IJ SOLN: 4.0000 mg | Freq: Four times a day (QID) | INTRAMUSCULAR | Status: DC | PRN

## 2019-07-18 MED ORDER — OLMESARTAN MEDOXOMIL-HCTZ 40-25 MG PO TABS: 1.0000 | ORAL_TABLET | Freq: Every day | ORAL | Status: DC

## 2019-07-18 MED ORDER — HEPARIN SODIUM (PORCINE) 5000 UNIT/ML IJ SOLN
5000.0000 [IU] | Freq: Three times a day (TID) | INTRAMUSCULAR | Status: DC
  Administered 2019-07-18 – 2019-07-22 (×12): 5000 [IU] via SUBCUTANEOUS
  Filled 2019-07-18 (×13): qty 1

## 2019-07-18 MED ORDER — ASPIRIN EC 81 MG PO TBEC
81.0000 mg | DELAYED_RELEASE_TABLET | ORAL | Status: DC
  Administered 2019-07-21: 81 mg via ORAL
  Filled 2019-07-18: qty 1

## 2019-07-18 MED ORDER — ATORVASTATIN CALCIUM 10 MG PO TABS
20.0000 mg | ORAL_TABLET | Freq: Every day | ORAL | Status: DC
  Administered 2019-07-18 – 2019-07-22 (×5): 20 mg via ORAL
  Filled 2019-07-18 (×5): qty 2

## 2019-07-18 MED ORDER — FUROSEMIDE 10 MG/ML IJ SOLN
40.0000 mg | Freq: Two times a day (BID) | INTRAMUSCULAR | Status: DC
  Administered 2019-07-18 – 2019-07-22 (×9): 40 mg via INTRAVENOUS
  Filled 2019-07-18 (×9): qty 4

## 2019-07-18 MED ORDER — SODIUM CHLORIDE 0.9% FLUSH
10.0000 mL | Freq: Two times a day (BID) | INTRAVENOUS | Status: DC
  Administered 2019-07-19 – 2019-07-22 (×4): 10 mL

## 2019-07-18 MED ORDER — HYDROCHLOROTHIAZIDE 25 MG PO TABS
25.0000 mg | ORAL_TABLET | Freq: Every day | ORAL | Status: DC
  Administered 2019-07-19 – 2019-07-20 (×2): 25 mg via ORAL
  Filled 2019-07-18 (×2): qty 1
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
## 2019-07-19 ENCOUNTER — Encounter (HOSPITAL_COMMUNITY): Payer: Self-pay | Admitting: Cardiovascular Disease

## 2019-07-19 ENCOUNTER — Inpatient Hospital Stay (HOSPITAL_COMMUNITY): Payer: Medicare PPO

## 2019-07-19 LAB — BASIC METABOLIC PANEL
Anion gap: 12 (ref 5–15)
BUN: 11 mg/dL (ref 8–23)
CO2: 30 mmol/L (ref 22–32)
Calcium: 8.4 mg/dL — ABNORMAL LOW (ref 8.9–10.3)
Chloride: 98 mmol/L (ref 98–111)
Creatinine, Ser: 0.89 mg/dL (ref 0.44–1.00)
GFR calc Af Amer: >60 mL/min (ref >60)
GFR calc non Af Amer: >60 mL/min (ref >60)
Glucose, Bld: 142 mg/dL — ABNORMAL HIGH (ref 70–99)
Potassium: 3.1 mmol/L — ABNORMAL LOW (ref 3.5–5.1)
Sodium: 140 mmol/L (ref 135–145)

## 2019-07-19 LAB — LIPID PANEL
Cholesterol: 286 mg/dL — ABNORMAL HIGH (ref 0–200)
HDL: 59 mg/dL (ref >40)
LDL Cholesterol: 195 mg/dL — ABNORMAL HIGH (ref 0–99)
Total CHOL/HDL Ratio: 4.8 RATIO
Triglycerides: 158 mg/dL — ABNORMAL HIGH (ref <150)
VLDL: 32 mg/dL (ref 0–40)

## 2019-07-19 LAB — ECHOCARDIOGRAM COMPLETE
Height: 60 in
Weight: 3984 oz

## 2019-07-19 LAB — SARS CORONAVIRUS 2 (TAT 6-24 HRS): SARS Coronavirus 2: NEGATIVE

## 2019-07-19 LAB — GLUCOSE, CAPILLARY
Glucose-Capillary: 127 mg/dL — ABNORMAL HIGH (ref 70–99)
Glucose-Capillary: 138 mg/dL — ABNORMAL HIGH (ref 70–99)
Glucose-Capillary: 120 mg/dL — ABNORMAL HIGH (ref 70–99)
Glucose-Capillary: 147 mg/dL — ABNORMAL HIGH (ref 70–99)

## 2019-07-19 MED ORDER — POTASSIUM CHLORIDE ER 10 MEQ PO TBCR
20.0000 meq | EXTENDED_RELEASE_TABLET | Freq: Three times a day (TID) | ORAL | Status: DC
  Administered 2019-07-19 – 2019-07-20 (×4): 20 meq via ORAL
  Filled 2019-07-19 (×8): qty 2

## 2019-07-19 NOTE — Progress Notes (Signed)
  Echocardiogram 2D Echocardiogram has been performed.  Tara Summers 07/19/2019, 12:25 PM

## 2019-07-19 NOTE — Plan of Care (Signed)
  Problem: Education: Goal: Ability to demonstrate management of disease process will improve Outcome: Progressing  Patient with 1200 fluid restriction, verbalizes strategies for limiting intake. Problem: Education: Goal: Ability to verbalize understanding of medication therapies will improve Outcome: Progressing  Patient asking for information on irbesartan, rationale and action.  Problem: Activity: Goal: Risk for activity intolerance will decrease Outcome: Progressing  Patient able to ambulate in room to set up ADL's, then performs adl's while seated, denies c/o SOB.

## 2019-07-19 NOTE — Progress Notes (Signed)
Ref: [name], MD   Subjective:  Feeling better with negative 250 cc fluid balance. Mild respiratory distress at rest. Echocardiogram is pending.  Blood work shows hypokalemia.  Hgb A1C is good at 6.1 %. LDL cholesterol is elevated at 195 mg. And total cholesterol is 286 mg. With triglycerides of 158 mg. HS-Troponin I is 10 ng x 2. SARS Coronavirus is negative. Chest x-ray shows mild vascular congestion. EKG: SR, non-specific T wave abnormality.  Objective:  Vital Signs in the last 24 hours: Temp:  [98 F (36.7 C)-98.9 F (37.2 C)] 98.9 F (37.2 C) (03/20 0802) Pulse Rate:  [68-79] 79 (03/20 0802) Cardiac Rhythm: Normal sinus rhythm (03/19 1901) Resp:  [16-18] 18 (03/20 0802) BP: (157-187)/(66-85) 157/66 (03/20 0802) SpO2:  [94 %-99 %] 97 % (03/20 0802) Weight:  [112.5 kg-112.9 kg] 112.9 kg (03/20 0500)  Physical Exam: BP Readings from Last 1 Encounters:  07/19/19 (!) 157/66     Wt Readings from Last 1 Encounters:  07/19/19 112.9 kg    Weight change:  Body mass index is 48.63 kg/m. HEENT: [place]/AT, Eyes-Brown, PERL, EOMI, Conjunctiva-Pink, Sclera-Non-icteric Neck: No JVD, No bruit, Trachea midline. Lungs:  Clearing, Bilateral. Cardiac:  Regular rhythm, normal S1 and S2, no S3. II/VI systolic murmur. Abdomen:  Soft, non-tender. BS present. Extremities:  2 + edema present. No cyanosis. No clubbing. CNS: AxOx3, Cranial nerves grossly intact, moves all 4 extremities.  Skin: Warm and dry.   Intake/Output from previous day: 03/19 0701 - 03/20 0700 In: 240 [P.O.:240] Out: 500 [Urine:500]    Lab Results: BMET    Component Value Date/Time   NA 140 07/19/2019 0500   NA 143 07/18/2019 1644   NA 138 03/08/2018 0702   K 3.1 (L) 07/19/2019 0500   K 3.0 (L) 07/18/2019 1644   K 2.7 (LL) 03/08/2018 0702   CL 98 07/19/2019 0500   CL 99 07/18/2019 1644   CL 103 03/08/2018 0702   CO2 30 07/19/2019 0500   CO2 33 (H) 07/18/2019 1644   CO2 28 03/08/2018 0702   GLUCOSE 142  (H) 07/19/2019 0500   GLUCOSE 140 (H) 07/18/2019 1644   GLUCOSE 153 (H) 03/08/2018 0702   BUN 11 07/19/2019 0500   BUN 8 07/18/2019 1644   BUN 17 03/08/2018 0702   CREATININE 0.89 07/19/2019 0500   CREATININE 0.89 07/18/2019 1644   CREATININE 0.91 03/08/2018 0702   CREATININE 0.71 12/21/2014 1224   CALCIUM 8.4 (L) 07/19/2019 0500   CALCIUM 8.6 (L) 07/18/2019 1644   CALCIUM 9.0 03/08/2018 0702   GFRNONAA >60 07/19/2019 0500   GFRNONAA >60 07/18/2019 1644   GFRNONAA >60 03/08/2018 0702   GFRAA >60 07/19/2019 0500   GFRAA >60 07/18/2019 1644   GFRAA >60 03/08/2018 0702   CBC    Component Value Date/Time   WBC 6.7 07/18/2019 1644   RBC 4.41 07/18/2019 1644   HGB 12.6 07/18/2019 1644   HGB 13.8 05/06/2007 1455   HCT 38.6 07/18/2019 1644   HCT 40.5 05/06/2007 1455   PLT 282 07/18/2019 1644   PLT 264 05/06/2007 1455   MCV 87.5 07/18/2019 1644   MCV 82.8 05/06/2007 1455   MCH 28.6 07/18/2019 1644   MCHC 32.6 07/18/2019 1644   RDW 12.6 07/18/2019 1644   RDW 12.7 05/06/2007 1455   LYMPHSABS 2.7 07/18/2019 1644   LYMPHSABS 2.2 05/06/2007 1455   MONOABS 0.5 07/18/2019 1644   MONOABS 0.3 05/06/2007 1455   EOSABS 0.4 07/18/2019 1644   EOSABS 0.3  05/06/2007 1455   BASOSABS 0.0 07/18/2019 1644   BASOSABS 0.0 05/06/2007 1455   HEPATIC Function Panel Recent Labs    07/18/19 1644  PROT 5.6*   HEMOGLOBIN A1C No components found for: HGA1C,  MPG CARDIAC ENZYMES Lab Results  Component Value Date   CKTOTAL 333 (H) 04/01/2012   CKMB 2.7 01/29/2007   TROPONINI <0.30 01/18/2013   TROPONINI 0.02        NO INDICATION OF MYOCARDIAL INJURY. 01/29/2007   BNP No results for input(s): PROBNP in the last 8760 hours. TSH No results for input(s): TSH in the last 8760 hours. CHOLESTEROL Recent Labs    07/19/19 0500  CHOL 286*    Scheduled Meds: . amLODipine  10 mg Oral Daily  . [START ON 07/21/2019] aspirin EC  81 mg Oral Once per day on Mon Thu  . atorvastatin  20 mg Oral  Daily  . furosemide  40 mg Intravenous Q12H  . heparin  5,000 Units Subcutaneous Q8H  . hydrochlorothiazide  25 mg Oral Daily  . insulin aspart  0-15 Units Subcutaneous TID WC  . irbesartan  300 mg Oral Daily  . metoprolol succinate  25 mg Oral Daily  . potassium chloride  20 mEq Oral TID  . sodium chloride flush  10-40 mL Intracatheter Q12H  . sodium chloride flush  3 mL Intravenous Q12H   Continuous Infusions: . sodium chloride     PRN Meds:.sodium chloride, acetaminophen, ondansetron (ZOFRAN) IV, polyethylene glycol, sodium chloride flush, sodium chloride flush  Assessment/Plan: Acute left systolic heart failure Shortness of breath Bilateral leg edema MI ruled out Type 2 DM Hypertension GERD Arthritis S/P right breast cancer and surgery  Check echocardiogram. Potassium supplement NM myocardial perfusion stress test in AM   LOS: 1 day   Time spent including chart review, lab review, examination, discussion with patient : 30 min   Dixie Dials  MD  07/19/2019, 9:23 AM

## 2019-07-20 ENCOUNTER — Inpatient Hospital Stay (HOSPITAL_COMMUNITY): Payer: Medicare PPO

## 2019-07-20 LAB — BASIC METABOLIC PANEL
Anion gap: 11 (ref 5–15)
BUN: 15 mg/dL (ref 8–23)
CO2: 32 mmol/L (ref 22–32)
Calcium: 8.7 mg/dL — ABNORMAL LOW (ref 8.9–10.3)
Chloride: 97 mmol/L — ABNORMAL LOW (ref 98–111)
Creatinine, Ser: 0.81 mg/dL (ref 0.44–1.00)
GFR calc Af Amer: >60 mL/min (ref >60)
GFR calc non Af Amer: >60 mL/min (ref >60)
Glucose, Bld: 158 mg/dL — ABNORMAL HIGH (ref 70–99)
Potassium: 3.8 mmol/L (ref 3.5–5.1)
Sodium: 140 mmol/L (ref 135–145)

## 2019-07-20 LAB — GLUCOSE, CAPILLARY
Glucose-Capillary: 136 mg/dL — ABNORMAL HIGH (ref 70–99)
Glucose-Capillary: 187 mg/dL — ABNORMAL HIGH (ref 70–99)
Glucose-Capillary: 117 mg/dL — ABNORMAL HIGH (ref 70–99)
Glucose-Capillary: 140 mg/dL — ABNORMAL HIGH (ref 70–99)

## 2019-07-20 MED ORDER — REGADENOSON 0.4 MG/5ML IV SOLN
INTRAVENOUS | Status: AC
  Administered 2019-07-20: 10:00:00 0.4 mg via INTRAVENOUS
  Filled 2019-07-20: qty 5

## 2019-07-20 MED ORDER — TECHNETIUM TC 99M TETROFOSMIN IV KIT
32.6000 | PACK | Freq: Once | INTRAVENOUS | Status: AC | PRN
  Administered 2019-07-20: 32.6 via INTRAVENOUS

## 2019-07-20 MED ORDER — POTASSIUM CHLORIDE ER 10 MEQ PO TBCR
20.0000 meq | EXTENDED_RELEASE_TABLET | Freq: Two times a day (BID) | ORAL | Status: DC
  Administered 2019-07-20 – 2019-07-22 (×4): 20 meq via ORAL
  Filled 2019-07-20 (×9): qty 2

## 2019-07-20 MED ORDER — TECHNETIUM TC 99M TETROFOSMIN IV KIT
11.2000 | PACK | Freq: Once | INTRAVENOUS | Status: AC | PRN
  Administered 2019-07-20: 10.4 via INTRAVENOUS

## 2019-07-20 MED ORDER — REGADENOSON 0.4 MG/5ML IV SOLN
0.4000 mg | Freq: Once | INTRAVENOUS | Status: AC
  Filled 2019-07-20: qty 5

## 2019-07-20 NOTE — H&P (View-Only) (Signed)
Ref: [name], MD   Subjective:  EKG negative, mild chest pain with Lexiscan Myoview stress test. VS stable. Echocardiogram showed normal LV systolic function with mild LVH. Mild MR  Objective:  Vital Signs in the last 24 hours: Temp:  [97.9 F (36.6 C)-98.4 F (36.9 C)] 98.4 F (36.9 C) (03/21 0725) Pulse Rate:  [68-77] 68 (03/21 0725) Cardiac Rhythm: Normal sinus rhythm (03/21 0700) Resp:  [16-18] 18 (03/21 0725) BP: (158-208)/(64-96) 196/94 (03/21 0956) SpO2:  [98 %-100 %] 100 % (03/21 0725) Weight:  [id number] kg] 112.2 kg (03/21 0441)  Physical Exam: BP Readings from Last 1 Encounters:  07/20/19 (!) 196/94     Wt Readings from Last 1 Encounters:  07/20/19 112.2 kg    Weight change: -0.318 kg Body mass index is 48.3 kg/m. HEENT: [place]/AT, Eyes-Brown, PERL, EOMI, Conjunctiva-Pink, Sclera-Non-icteric Neck: No JVD, No bruit, Trachea midline. Lungs:  Clearing, Bilateral. Cardiac:  Regular rhythm, normal S1 and S2, no S3. II/VI systolic murmur. Abdomen:  Soft, non-tender. BS present. Extremities:  2 + edema present. No cyanosis. No clubbing. CNS: AxOx3, Cranial nerves grossly intact, moves all 4 extremities.  Skin: Warm and dry.   Intake/Output from previous day: 03/20 0701 - 03/21 0700 In: 960 [P.O.:960] Out: 800 [Urine:800]    Lab Results: BMET    Component Value Date/Time   NA 140 07/20/2019 0626   NA 140 07/19/2019 0500   NA 143 07/18/2019 1644   K 3.8 07/20/2019 0626   K 3.1 (L) 07/19/2019 0500   K 3.0 (L) 07/18/2019 1644   CL 97 (L) 07/20/2019 0626   CL 98 07/19/2019 0500   CL 99 07/18/2019 1644   CO2 32 07/20/2019 0626   CO2 30 07/19/2019 0500   CO2 33 (H) 07/18/2019 1644   GLUCOSE 158 (H) 07/20/2019 0626   GLUCOSE 142 (H) 07/19/2019 0500   GLUCOSE 140 (H) 07/18/2019 1644   BUN 15 07/20/2019 0626   BUN 11 07/19/2019 0500   BUN 8 07/18/2019 1644   CREATININE 0.81 07/20/2019 0626   CREATININE 0.89 07/19/2019 0500   CREATININE 0.89 07/18/2019  1644   CREATININE 0.71 12/21/2014 1224   CALCIUM 8.7 (L) 07/20/2019 0626   CALCIUM 8.4 (L) 07/19/2019 0500   CALCIUM 8.6 (L) 07/18/2019 1644   GFRNONAA >60 07/20/2019 0626   GFRNONAA >60 07/19/2019 0500   GFRNONAA >60 07/18/2019 1644   GFRAA >60 07/20/2019 0626   GFRAA >60 07/19/2019 0500   GFRAA >60 07/18/2019 1644   CBC    Component Value Date/Time   WBC 6.7 07/18/2019 1644   RBC 4.41 07/18/2019 1644   HGB 12.6 07/18/2019 1644   HGB 13.8 05/06/2007 1455   HCT 38.6 07/18/2019 1644   HCT 40.5 05/06/2007 1455   PLT 282 07/18/2019 1644   PLT 264 05/06/2007 1455   MCV 87.5 07/18/2019 1644   MCV 82.8 05/06/2007 1455   MCH 28.6 07/18/2019 1644   MCHC 32.6 07/18/2019 1644   RDW 12.6 07/18/2019 1644   RDW 12.7 05/06/2007 1455   LYMPHSABS 2.7 07/18/2019 1644   LYMPHSABS 2.2 05/06/2007 1455   MONOABS 0.5 07/18/2019 1644   MONOABS 0.3 05/06/2007 1455   EOSABS 0.4 07/18/2019 1644   EOSABS 0.3 05/06/2007 1455   BASOSABS 0.0 07/18/2019 1644   BASOSABS 0.0 05/06/2007 1455   HEPATIC Function Panel Recent Labs    07/18/19 1644  PROT 5.6*   HEMOGLOBIN A1C No components found for: HGA1C,  MPG CARDIAC ENZYMES Lab Results  Component  Value Date   CKTOTAL 333 (H) 04/01/2012   CKMB 2.7 01/29/2007   TROPONINI <0.30 01/18/2013   TROPONINI 0.02        NO INDICATION OF MYOCARDIAL INJURY. 01/29/2007   BNP No results for input(s): PROBNP in the last 8760 hours. TSH No results for input(s): TSH in the last 8760 hours. CHOLESTEROL Recent Labs    07/19/19 0500  CHOL 286*    Scheduled Meds: . amLODipine  10 mg Oral Daily  . [START ON 07/21/2019] aspirin EC  81 mg Oral Once per day on Mon Thu  . atorvastatin  20 mg Oral Daily  . furosemide  40 mg Intravenous Q12H  . heparin  5,000 Units Subcutaneous Q8H  . hydrochlorothiazide  25 mg Oral Daily  . insulin aspart  0-15 Units Subcutaneous TID WC  . irbesartan  300 mg Oral Daily  . metoprolol succinate  25 mg Oral Daily  .  potassium chloride  20 mEq Oral TID  . sodium chloride flush  10-40 mL Intracatheter Q12H  . sodium chloride flush  3 mL Intravenous Q12H   Continuous Infusions: . sodium chloride     PRN Meds:.sodium chloride, acetaminophen, ondansetron (ZOFRAN) IV, polyethylene glycol, sodium chloride flush, sodium chloride flush  Assessment/Plan: Acute diastolic left heart failure Type 2 DM Obesity HTN GERD Arthritis S/P right breast cancer and surgery Hypokalemia resolved  Awaiting Nuclear imaging and reporting.   LOS: 2 days   Time spent including chart review, lab review, examination, discussion with patient : 30 min   Dixie Dials  MD  07/20/2019, 11:18 AM

## 2019-07-20 NOTE — Progress Notes (Signed)
Ref: Tara Mires, MD   Subjective:  EKG negative, mild chest pain with Lexiscan Myoview stress test. VS stable. Echocardiogram showed normal LV systolic function with mild LVH. Mild MR  Objective:  Vital Signs in the last 24 hours: Temp:  [97.9 F (36.6 C)-98.4 F (36.9 C)] 98.4 F (36.9 C) (03/21 0725) Pulse Rate:  [68-77] 68 (03/21 0725) Cardiac Rhythm: Normal sinus rhythm (03/21 0700) Resp:  [16-18] 18 (03/21 0725) BP: (158-208)/(64-96) 196/94 (03/21 0956) SpO2:  [98 %-100 %] 100 % (03/21 0725) Weight:  [id number] kg] 112.2 kg (03/21 0441)  Physical Exam: BP Readings from Last 1 Encounters:  07/20/19 (!) 196/94     Wt Readings from Last 1 Encounters:  07/20/19 112.2 kg    Weight change: -0.318 kg Body mass index is 48.3 kg/m. HEENT: Tara Summers/AT, Eyes-Brown, PERL, EOMI, Conjunctiva-Pink, Sclera-Non-icteric Neck: No JVD, No bruit, Trachea midline. Lungs:  Clearing, Bilateral. Cardiac:  Regular rhythm, normal S1 and S2, no S3. II/VI systolic murmur. Abdomen:  Soft, non-tender. BS present. Extremities:  2 + edema present. No cyanosis. No clubbing. CNS: AxOx3, Cranial nerves grossly intact, moves all 4 extremities.  Skin: Warm and dry.   Intake/Output from previous day: 03/20 0701 - 03/21 0700 In: 960 [P.O.:960] Out: 800 [Urine:800]    Lab Results: BMET    Component Value Date/Time   NA 140 07/20/2019 0626   NA 140 07/19/2019 0500   NA 143 07/18/2019 1644   K 3.8 07/20/2019 0626   K 3.1 (L) 07/19/2019 0500   K 3.0 (L) 07/18/2019 1644   CL 97 (L) 07/20/2019 0626   CL 98 07/19/2019 0500   CL 99 07/18/2019 1644   CO2 32 07/20/2019 0626   CO2 30 07/19/2019 0500   CO2 33 (H) 07/18/2019 1644   GLUCOSE 158 (H) 07/20/2019 0626   GLUCOSE 142 (H) 07/19/2019 0500   GLUCOSE 140 (H) 07/18/2019 1644   BUN 15 07/20/2019 0626   BUN 11 07/19/2019 0500   BUN 8 07/18/2019 1644   CREATININE 0.81 07/20/2019 0626   CREATININE 0.89 07/19/2019 0500   CREATININE 0.89 07/18/2019  1644   CREATININE 0.71 12/21/2014 1224   CALCIUM 8.7 (L) 07/20/2019 0626   CALCIUM 8.4 (L) 07/19/2019 0500   CALCIUM 8.6 (L) 07/18/2019 1644   GFRNONAA >60 07/20/2019 0626   GFRNONAA >60 07/19/2019 0500   GFRNONAA >60 07/18/2019 1644   GFRAA >60 07/20/2019 0626   GFRAA >60 07/19/2019 0500   GFRAA >60 07/18/2019 1644   CBC    Component Value Date/Time   WBC 6.7 07/18/2019 1644   RBC 4.41 07/18/2019 1644   HGB 12.6 07/18/2019 1644   HGB 13.8 05/06/2007 1455   HCT 38.6 07/18/2019 1644   HCT 40.5 05/06/2007 1455   PLT 282 07/18/2019 1644   PLT 264 05/06/2007 1455   MCV 87.5 07/18/2019 1644   MCV 82.8 05/06/2007 1455   MCH 28.6 07/18/2019 1644   MCHC 32.6 07/18/2019 1644   RDW 12.6 07/18/2019 1644   RDW 12.7 05/06/2007 1455   LYMPHSABS 2.7 07/18/2019 1644   LYMPHSABS 2.2 05/06/2007 1455   MONOABS 0.5 07/18/2019 1644   MONOABS 0.3 05/06/2007 1455   EOSABS 0.4 07/18/2019 1644   EOSABS 0.3 05/06/2007 1455   BASOSABS 0.0 07/18/2019 1644   BASOSABS 0.0 05/06/2007 1455   HEPATIC Function Panel Recent Labs    07/18/19 1644  PROT 5.6*   HEMOGLOBIN A1C No components found for: HGA1C,  MPG CARDIAC ENZYMES Lab Results  Component  Value Date   CKTOTAL 333 (H) 04/01/2012   CKMB 2.7 01/29/2007   TROPONINI <0.30 01/18/2013   TROPONINI 0.02        NO INDICATION OF MYOCARDIAL INJURY. 01/29/2007   BNP No results for input(s): PROBNP in the last 8760 hours. TSH No results for input(s): TSH in the last 8760 hours. CHOLESTEROL Recent Labs    07/19/19 0500  CHOL 286*    Scheduled Meds: . amLODipine  10 mg Oral Daily  . [START ON 07/21/2019] aspirin EC  81 mg Oral Once per day on Mon Thu  . atorvastatin  20 mg Oral Daily  . furosemide  40 mg Intravenous Q12H  . heparin  5,000 Units Subcutaneous Q8H  . hydrochlorothiazide  25 mg Oral Daily  . insulin aspart  0-15 Units Subcutaneous TID WC  . irbesartan  300 mg Oral Daily  . metoprolol succinate  25 mg Oral Daily  .  potassium chloride  20 mEq Oral TID  . sodium chloride flush  10-40 mL Intracatheter Q12H  . sodium chloride flush  3 mL Intravenous Q12H   Continuous Infusions: . sodium chloride     PRN Meds:.sodium chloride, acetaminophen, ondansetron (ZOFRAN) IV, polyethylene glycol, sodium chloride flush, sodium chloride flush  Assessment/Plan: Acute diastolic left heart failure Type 2 DM Obesity HTN GERD Arthritis S/P right breast cancer and surgery Hypokalemia resolved  Awaiting Nuclear imaging and reporting.   LOS: 2 days   Time spent including chart review, lab review, examination, discussion with patient : 30 min   Tara Dials  MD  07/20/2019, 11:18 AM

## 2019-07-21 ENCOUNTER — Encounter (HOSPITAL_COMMUNITY)
Admission: AD | Disposition: A | Discharge status: Home / Self Care | Payer: Self-pay | Source: Home / Self Care | Attending: Cardiovascular Disease

## 2019-07-21 HISTORY — PX: RIGHT/LEFT HEART CATH AND CORONARY ANGIOGRAPHY: CATH118266

## 2019-07-21 LAB — BASIC METABOLIC PANEL
Anion gap: 9 (ref 5–15)
BUN: 17 mg/dL (ref 8–23)
CO2: 33 mmol/L — ABNORMAL HIGH (ref 22–32)
Calcium: 8.9 mg/dL (ref 8.9–10.3)
Chloride: 98 mmol/L (ref 98–111)
Creatinine, Ser: 0.81 mg/dL (ref 0.44–1.00)
GFR calc Af Amer: >60 mL/min (ref >60)
GFR calc non Af Amer: >60 mL/min (ref >60)
Glucose, Bld: 152 mg/dL — ABNORMAL HIGH (ref 70–99)
Potassium: 3.6 mmol/L (ref 3.5–5.1)
Sodium: 140 mmol/L (ref 135–145)

## 2019-07-21 LAB — POCT I-STAT EG7
Acid-Base Excess: 10 mmol/L — ABNORMAL HIGH (ref 0.0–2.0)
Bicarbonate: 36.8 mmol/L — ABNORMAL HIGH (ref 20.0–28.0)
Calcium, Ion: 1.24 mmol/L (ref 1.15–1.40)
HCT: 32 % — ABNORMAL LOW (ref 36.0–46.0)
Hemoglobin: 10.9 g/dL — ABNORMAL LOW (ref 12.0–15.0)
O2 Saturation: 76 %
Potassium: 3.2 mmol/L — ABNORMAL LOW (ref 3.5–5.1)
Sodium: 140 mmol/L (ref 135–145)
TCO2: 39 mmol/L — ABNORMAL HIGH (ref 22–32)
pCO2, Ven: 61.1 mmHg — ABNORMAL HIGH (ref 44.0–60.0)
pH, Ven: 7.388 (ref 7.250–7.430)
pO2, Ven: 43 mmHg (ref 32.0–45.0)

## 2019-07-21 LAB — GLUCOSE, CAPILLARY
Glucose-Capillary: 142 mg/dL — ABNORMAL HIGH (ref 70–99)
Glucose-Capillary: 146 mg/dL — ABNORMAL HIGH (ref 70–99)
Glucose-Capillary: 144 mg/dL — ABNORMAL HIGH (ref 70–99)
Glucose-Capillary: 153 mg/dL — ABNORMAL HIGH (ref 70–99)
Glucose-Capillary: 153 mg/dL — ABNORMAL HIGH (ref 70–99)

## 2019-07-21 LAB — CBC
HCT: 37.4 % (ref 36.0–46.0)
Hemoglobin: 11.9 g/dL — ABNORMAL LOW (ref 12.0–15.0)
MCH: 27.5 pg (ref 26.0–34.0)
MCHC: 31.8 g/dL (ref 30.0–36.0)
MCV: 86.4 fL (ref 80.0–100.0)
Platelets: 256 10*3/uL (ref 150–400)
RBC: 4.33 MIL/uL (ref 3.87–5.11)
RDW: 12.4 % (ref 11.5–15.5)
WBC: 6.8 10*3/uL (ref 4.0–10.5)
nRBC: 0 % (ref 0.0–0.2)

## 2019-07-21 LAB — POCT I-STAT 7, (LYTES, BLD GAS, ICA,H+H)
Acid-Base Excess: 10 mmol/L — ABNORMAL HIGH (ref 0.0–2.0)
Bicarbonate: 36.3 mmol/L — ABNORMAL HIGH (ref 20.0–28.0)
Calcium, Ion: 1.22 mmol/L (ref 1.15–1.40)
HCT: 33 % — ABNORMAL LOW (ref 36.0–46.0)
Hemoglobin: 11.2 g/dL — ABNORMAL LOW (ref 12.0–15.0)
O2 Saturation: 99 %
Potassium: 3.2 mmol/L — ABNORMAL LOW (ref 3.5–5.1)
Sodium: 141 mmol/L (ref 135–145)
TCO2: 38 mmol/L — ABNORMAL HIGH (ref 22–32)
pCO2 arterial: 57.4 mmHg — ABNORMAL HIGH (ref 32.0–48.0)
pH, Arterial: 7.409 (ref 7.350–7.450)
pO2, Arterial: 158 mmHg — ABNORMAL HIGH (ref 83.0–108.0)

## 2019-07-21 SURGERY — RIGHT/LEFT HEART CATH AND CORONARY ANGIOGRAPHY
Anesthesia: LOCAL

## 2019-07-21 MED ORDER — HYDRALAZINE HCL 20 MG/ML IJ SOLN: 10.0000 mg | INTRAMUSCULAR | Status: AC | PRN

## 2019-07-21 MED ORDER — SODIUM CHLORIDE 0.9 % IV SOLN: INTRAVENOUS | Status: DC

## 2019-07-21 MED ORDER — LIDOCAINE HCL (PF) 1 % IJ SOLN
INTRAMUSCULAR | Status: DC | PRN
  Administered 2019-07-21: 15 mL

## 2019-07-21 MED ORDER — FENTANYL CITRATE (PF) 100 MCG/2ML IJ SOLN
INTRAMUSCULAR | Status: DC | PRN
  Administered 2019-07-21: 25 ug via INTRAVENOUS

## 2019-07-21 MED ORDER — HYDRALAZINE HCL 20 MG/ML IJ SOLN
INTRAMUSCULAR | Status: AC
  Filled 2019-07-21: qty 1

## 2019-07-21 MED ORDER — SODIUM CHLORIDE 0.9% FLUSH: 3.0000 mL | INTRAVENOUS | Status: DC | PRN

## 2019-07-21 MED ORDER — LABETALOL HCL 5 MG/ML IV SOLN
INTRAVENOUS | Status: DC | PRN
  Administered 2019-07-21: 10 mg via INTRAVENOUS

## 2019-07-21 MED ORDER — SODIUM CHLORIDE 0.9 % IV SOLN: 250.0000 mL | INTRAVENOUS | Status: DC | PRN

## 2019-07-21 MED ORDER — MIDAZOLAM HCL 2 MG/2ML IJ SOLN
INTRAMUSCULAR | Status: DC | PRN
  Administered 2019-07-21: 1 mg via INTRAVENOUS

## 2019-07-21 MED ORDER — HEPARIN (PORCINE) IN NACL 1000-0.9 UT/500ML-% IV SOLN
INTRAVENOUS | Status: AC
  Filled 2019-07-21: qty 500

## 2019-07-21 MED ORDER — LABETALOL HCL 5 MG/ML IV SOLN
INTRAVENOUS | Status: AC
  Filled 2019-07-21: qty 4

## 2019-07-21 MED ORDER — FENTANYL CITRATE (PF) 100 MCG/2ML IJ SOLN
INTRAMUSCULAR | Status: AC
  Filled 2019-07-21: qty 2

## 2019-07-21 MED ORDER — OXYCODONE HCL 5 MG PO TABS
5.0000 mg | ORAL_TABLET | Freq: Four times a day (QID) | ORAL | Status: DC | PRN
  Administered 2019-07-21: 13:00:00 5 mg via ORAL
  Filled 2019-07-21: qty 1

## 2019-07-21 MED ORDER — LIDOCAINE HCL (PF) 1 % IJ SOLN
INTRAMUSCULAR | Status: AC
  Filled 2019-07-21: qty 30

## 2019-07-21 MED ORDER — IOHEXOL 350 MG/ML SOLN
INTRAVENOUS | Status: DC | PRN
  Administered 2019-07-21: 30 mL

## 2019-07-21 MED ORDER — HEPARIN (PORCINE) IN NACL 1000-0.9 UT/500ML-% IV SOLN
INTRAVENOUS | Status: DC | PRN
  Administered 2019-07-21 (×2): 500 mL

## 2019-07-21 MED ORDER — MIDAZOLAM HCL 2 MG/2ML IJ SOLN
INTRAMUSCULAR | Status: AC
  Filled 2019-07-21: qty 2

## 2019-07-21 MED ORDER — SODIUM CHLORIDE 0.9% FLUSH
3.0000 mL | Freq: Two times a day (BID) | INTRAVENOUS | Status: DC
  Administered 2019-07-21: 11:00:00 3 mL via INTRAVENOUS

## 2019-07-21 MED ORDER — LABETALOL HCL 5 MG/ML IV SOLN: 10.0000 mg | INTRAVENOUS | Status: AC | PRN

## 2019-07-21 MED ORDER — HYDRALAZINE HCL 20 MG/ML IJ SOLN
INTRAMUSCULAR | Status: DC | PRN
  Administered 2019-07-21: 10 mg via INTRAVENOUS

## 2019-07-21 MED ORDER — SODIUM CHLORIDE 0.9 % IV SOLN
INTRAVENOUS | Status: AC | PRN
  Administered 2019-07-21: 10 mL/h via INTRAVENOUS

## 2019-07-21 SURGICAL SUPPLY — 14 items
CATH DXT MULTI JL4 JR4 ANG PIG (CATHETERS) ×1 IMPLANT
CATH SWAN GANZ 7F STRAIGHT (CATHETERS) ×1 IMPLANT
KIT HEART LEFT (KITS) ×2 IMPLANT
NDL SMART 18GX3.5CM LONG (NEEDLE) IMPLANT
NDL SMART REG 18GX2-3/4 (NEEDLE) IMPLANT
NEEDLE SMART 18GX3.5CM LONG (NEEDLE) ×2 IMPLANT
NEEDLE SMART REG 18GX2-3/4 (NEEDLE) ×2 IMPLANT
PACK CARDIAC CATHETERIZATION (CUSTOM PROCEDURE TRAY) ×2 IMPLANT
SHEATH PINNACLE 5F 10CM (SHEATH) ×1 IMPLANT
SHEATH PINNACLE 7F 10CM (SHEATH) ×1 IMPLANT
SHEATH PROBE COVER 6X72 (BAG) ×1 IMPLANT
TRANSDUCER W/STOPCOCK (MISCELLANEOUS) ×3 IMPLANT
TUBING ART PRESS 72  MALE/FEM (TUBING) ×4
TUBING ART PRESS 72 MALE/FEM (TUBING) IMPLANT

## 2019-07-21 NOTE — Progress Notes (Signed)
Per pharmacy, 2200 heparin is to be given at 2330. After administering this dose it is to be given as scheduled.

## 2019-07-21 NOTE — Plan of Care (Signed)

## 2019-07-21 NOTE — Progress Notes (Signed)
Site area: Right groin a 5 french arterial and 7 french venous sheath was removed  Site Prior to Removal:  Level 0  Pressure Applied For 20 MINUTES    Bedrest Beginning at 0950am  Manual:   Yes.    Patient Status During Pull:  stable  Post Pull Groin Site:  Level 0  Post Pull Instructions Given:  Yes.    Post Pull Pulses Present:  Yes.    Dressing Applied:  Yes.    Comments:

## 2019-07-21 NOTE — Interval H&P Note (Signed)
History and Physical Interval Note:  07/21/2019 7:51 AM  Tara Summers  has presented today for surgery, with the diagnosis of unstable angina.  The various methods of treatment have been discussed with the patient and family. After consideration of risks, benefits and other options for treatment, the patient has consented to  Procedure(s): RIGHT/LEFT HEART CATH AND CORONARY ANGIOGRAPHY (N/A) as a surgical intervention.  The patient's history has been reviewed, patient examined, no change in status, stable for surgery.  I have reviewed the patient's chart and labs.  Questions were answered to the patient's satisfaction.     Tara Summers

## 2019-07-22 LAB — CBC
HCT: 35.6 % — ABNORMAL LOW (ref 36.0–46.0)
Hemoglobin: 11.7 g/dL — ABNORMAL LOW (ref 12.0–15.0)
MCH: 28.3 pg (ref 26.0–34.0)
MCHC: 32.9 g/dL (ref 30.0–36.0)
MCV: 86 fL (ref 80.0–100.0)
Platelets: 233 10*3/uL (ref 150–400)
RBC: 4.14 MIL/uL (ref 3.87–5.11)
RDW: 12.5 % (ref 11.5–15.5)
WBC: 7.1 10*3/uL (ref 4.0–10.5)
nRBC: 0 % (ref 0.0–0.2)

## 2019-07-22 LAB — BASIC METABOLIC PANEL
Anion gap: 8 (ref 5–15)
BUN: 17 mg/dL (ref 8–23)
CO2: 30 mmol/L (ref 22–32)
Calcium: 8.5 mg/dL — ABNORMAL LOW (ref 8.9–10.3)
Chloride: 103 mmol/L (ref 98–111)
Creatinine, Ser: 0.88 mg/dL (ref 0.44–1.00)
GFR calc Af Amer: >60 mL/min (ref >60)
GFR calc non Af Amer: >60 mL/min (ref >60)
Glucose, Bld: 143 mg/dL — ABNORMAL HIGH (ref 70–99)
Potassium: 3.5 mmol/L (ref 3.5–5.1)
Sodium: 141 mmol/L (ref 135–145)

## 2019-07-22 LAB — GLUCOSE, CAPILLARY
Glucose-Capillary: 140 mg/dL — ABNORMAL HIGH (ref 70–99)
Glucose-Capillary: 139 mg/dL — ABNORMAL HIGH (ref 70–99)

## 2019-07-22 MED ORDER — CLOPIDOGREL BISULFATE 75 MG PO TABS
75.0000 mg | ORAL_TABLET | Freq: Every day | ORAL | Status: DC
  Administered 2019-07-22: 75 mg via ORAL
  Filled 2019-07-22: qty 1

## 2019-07-22 MED ORDER — POTASSIUM CHLORIDE ER 20 MEQ PO TBCR: 20.0000 meq | EXTENDED_RELEASE_TABLET | Freq: Two times a day (BID) | ORAL | 3 refills | Status: AC

## 2019-07-22 MED ORDER — LOSARTAN POTASSIUM 100 MG PO TABS: 100.0000 mg | ORAL_TABLET | Freq: Every day | ORAL | 3 refills | Status: AC

## 2019-07-22 MED ORDER — ATORVASTATIN CALCIUM 40 MG PO TABS: 40.0000 mg | ORAL_TABLET | Freq: Every day | ORAL | 3 refills | Status: AC

## 2019-07-22 MED ORDER — AMLODIPINE BESYLATE 5 MG PO TABS: 5.0000 mg | ORAL_TABLET | Freq: Every day | ORAL | Status: DC

## 2019-07-22 MED ORDER — HYDRALAZINE HCL 25 MG PO TABS: 25.0000 mg | ORAL_TABLET | Freq: Two times a day (BID) | ORAL | 3 refills | Status: DC

## 2019-07-22 MED ORDER — CLOPIDOGREL BISULFATE 75 MG PO TABS: 75.0000 mg | ORAL_TABLET | Freq: Every day | ORAL | 3 refills | Status: AC

## 2019-07-22 MED ORDER — HYDRALAZINE HCL 25 MG PO TABS
25.0000 mg | ORAL_TABLET | Freq: Two times a day (BID) | ORAL | Status: DC
  Administered 2019-07-22: 16:00:00 25 mg via ORAL
  Filled 2019-07-22: qty 1

## 2019-07-22 MED ORDER — ATORVASTATIN CALCIUM 40 MG PO TABS: 40.0000 mg | ORAL_TABLET | Freq: Every day | ORAL | Status: DC

## 2019-07-22 MED ORDER — TORSEMIDE 20 MG PO TABS: 20.0000 mg | ORAL_TABLET | Freq: Two times a day (BID) | ORAL | Status: AC

## 2019-07-22 MED ORDER — LOSARTAN POTASSIUM 50 MG PO TABS: 100.0000 mg | ORAL_TABLET | Freq: Every day | ORAL | Status: DC

## 2019-07-22 NOTE — Care Management Important Message (Signed)
Important Message  Patient Details  Name: RASHMI STURDEVANT MRN: [id number] Date of Birth: 1950/12/11   Medicare Important Message Given:  Yes     Shelda Altes 07/22/2019, 10:41 AM

## 2019-07-22 NOTE — Progress Notes (Addendum)
Per verbal order from Doctor Doylene Canard, 158mL/hr continuous normal saline infusion is to be stopped and fluid restriction of 1253mL/daily is to be reinstated. Infusion has been stopped as of this time.

## 2019-07-22 NOTE — Progress Notes (Signed)
Reviewed AVS with patient at 49; husband at bedside. All questions answered.   Patient escorted to main entrance via wheelchair; discharged via private auto.

## 2019-07-22 NOTE — Discharge Summary (Signed)
Physician Discharge Summary  Patient ID: Tara Summers MRN: [id number] DOB/AGE: March 23, 1951 69 y.o.  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 She has PMH of hypertension, GERD, type 2 DM, Right breast cancer, s/p breast surgery and arthritis. Chest x-ray showed pulmonary vascular congestion and BNP was near normal. Troponin I was normal x 2. She underwent nuclear stress test showing apical infero-lateral wall ischemia. She had R + L heart catheterization showing near normal right heart pressures. She has mild disease in major coronary arteries except small diagonal 1 and 2 with 80-90 % osteal stenosis. She will take aspirin and Plavix 75 mg. one daily for 6 to 12 months.  Her amlodipine was deceased by 50 % for chronic leg edema and Hydralazine is added for BP control. Olmesartan with HCTZ was changed to Losartan for low potassium level and supplemental potassium was given. She will continue Torsemide 20 mg. twice daily with fluid restriction. Her atorvastatin will be increased to 40 mg. daily for elevated LDL cholesterol of 195 mg. She will follow heart failure booklet  instructions. She will decrease fluid and food intake by 25-50 % along with low salt and low fat diet.  She can resume metformin from tomorrow. Her Hgb A1C was 6.1 %.  She will see me in 1 week and primary care in 2 weeks.  Consults: cardiology  Significant Diagnostic Studies: labs: Near normal CBC, BMET with mild hyperglycemia. Elevated LDL of 195 mg. and total cholesterol of 286 mg.  Normal HS-Troponin I x 2. Hgb A1C of 6.1  EKG: Normal sinus rhythm.  Chest x-ray: Vascular congestion.  Echocardiogram: Preserved LV systolic function with mild LVH and mild diastolic dysfunction, mild MR and TR.  Nuclear stress test: Apical inferolateral wall ischemia.  R + L heart cath : near normal right heart pressures. Mild multivessel CAD except severe disease in small diagonal 1 and 2.  Treatments: cardiac meds: amlodipine, aspirin, metoprolol, Clopidogrel, Potassium, hydralazine, losartan, and Torsemide.  Discharge Exam: Blood pressure (!) 165/73, pulse 79, temperature 98.5 F (36.9 C), temperature source Oral, resp. rate 20, height 5' (1.524 m), weight 110.8 kg, SpO2 99 %. General appearance: alert, cooperative and appears stated age. Head: Normocephalic, atraumatic. Eyes: Brown eyes, pink conjunctiva, corneas clear. PERRL, EOM's intact.  Neck: No adenopathy, no carotid bruit, no JVD, supple, symmetrical, trachea midline and thyroid not enlarged. Resp: Clearing to auscultation bilaterally. Cardio: Regular rate and rhythm, S1, S2 normal, II/VI systolic murmur, no click, rub or gallop. GI: Soft, non-tender; bowel sounds normal; no organomegaly. Extremities: 2 + edema, no cyanosis or clubbing. Mild venous stasis changes of the skin of lower legs and feet. Skin: Warm and dry.  Neurologic: Alert and oriented X 3, normal  strength and tone. Normal coordination and slow gait.  Disposition: Discharge disposition: 01-Home or Self Care        Allergies as of 07/22/2019   No Known Allergies      Medication List    STOP taking these medications   olmesartan-hydrochlorothiazide 40-25 MG tablet Commonly known as: BENICAR HCT     TAKE these medications   amLODipine 5 MG tablet Commonly known as: NORVASC Take 1 tablet (5 mg total) by mouth daily. What changed: how much to take   aspirin EC 81 MG tablet Take 81 mg by mouth 2 (two) times a week.   atorvastatin 40 MG tablet Commonly known as: LIPITOR Take 1 tablet (40 mg total) by mouth daily. Start taking on: July 23, 2019 What changed:   medication strength  how much to take   clopidogrel 75 MG tablet Commonly known as: PLAVIX Take 1 tablet (75 mg total) by mouth daily.   Contour Test test strip Generic drug: glucose blood EVERY DAY   hydrALAZINE 25 MG tablet Commonly known as: APRESOLINE Take 1 tablet (25 mg total) by mouth 2 (two) times daily.   ipratropium 0.06 % nasal spray Commonly known as: ATROVENT Place 1 spray into both nostrils in the morning and at bedtime.   losartan 100 MG tablet Commonly known as: COZAAR Take 1 tablet (100 mg total) by mouth daily. Start taking on: July 23, 2019   metFORMIN 500 MG tablet Commonly known as: GLUCOPHAGE Take 500 mg by mouth 2 (two) times daily with a meal.   metoprolol succinate 25 MG 24 hr tablet Commonly known as: TOPROL-XL Take 25 mg by mouth daily.   polyethylene glycol 17 g packet Commonly known as: MIRALAX / GLYCOLAX Take 17 g by mouth daily as needed for moderate constipation.   Potassium Chloride ER 20 MEQ Tbcr Take 20 mEq by mouth 2 (two) times daily.   PRESCRIPTION MEDICATION Place 2 drops into the right eye daily. Medication: Prednisolone-Moxifloxacin-Nepafenac  1%, 0.5%,0.1%   torsemide 20 MG tablet Commonly known as: DEMADEX Take 1 tablet (20 mg total) by mouth 2 (two) times daily. What changed: when to take this      Follow-up Information    [name], MD. Schedule an appointment as soon as possible for a visit in 2 week(s).    Specialty: Family Medicine Contact information: [place] [address] [phone]        Dixie Dials, MD. Schedule an appointment as soon as possible for a visit in 1 week(s).   Specialty: Cardiology Contact information: [place] [address] [phone]           Time spent: Review of old chart, current chart, lab, x-ray, cardiac tests and discussion with patient over 60 minutes.  Signed: Birdie Riddle 07/22/2019, 2:52 PM

## 2019-07-31 ENCOUNTER — Other Ambulatory Visit: Payer: Self-pay

## 2019-08-04 ENCOUNTER — Ambulatory Visit (INDEPENDENT_AMBULATORY_CARE_PROVIDER_SITE_OTHER): Payer: Medicare PPO | Admitting: Women's Health

## 2019-08-04 ENCOUNTER — Encounter: Payer: Self-pay | Admitting: Women's Health

## 2019-08-04 ENCOUNTER — Telehealth: Payer: Self-pay | Admitting: *Deleted

## 2019-08-04 ENCOUNTER — Other Ambulatory Visit: Payer: Self-pay

## 2019-08-04 VITALS — BP 126/80 | Ht 61.0 in | Wt 242.0 lb

## 2019-08-04 DIAGNOSIS — Z01419 Encounter for gynecological examination (general) (routine) without abnormal findings: Secondary | ICD-10-CM

## 2019-08-04 DIAGNOSIS — Z853 Personal history of malignant neoplasm of breast: Secondary | ICD-10-CM

## 2019-08-04 DIAGNOSIS — Z9289 Personal history of other medical treatment: Secondary | ICD-10-CM

## 2019-08-04 NOTE — Telephone Encounter (Signed)
-----   Message from Huel Cote, NP sent at 08/04/2019  1:08 PM EDT ----- Please schedule Tara Summers with a geneticist counselor at the cancer center.  History of breast cancer and she has 4 daughters.  Can go any day but Mondays.  She has a history of a TVH with BSO.  2008 right breast cancer lumpectomy, radiation.  Thank you

## 2019-08-04 NOTE — Patient Instructions (Signed)
Good to see you and good luck with changing your diet- YOU can do it!!! Vit D 2000 iu daily Try chair yoga Health Maintenance After Age 67 After age 2, you are at a higher risk for certain long-term diseases and infections as well as injuries from falls. Falls are a major cause of broken bones and head injuries in people who are older than age 19. Getting regular preventive care can help to keep you healthy and well. Preventive care includes getting regular testing and making lifestyle changes as recommended by your health care provider. Talk with your health care provider about:  Which screenings and tests you should have. A screening is a test that checks for a disease when you have no symptoms.  A diet and exercise plan that is right for you. What should I know about screenings and tests to prevent falls? Screening and testing are the best ways to find a health problem early. Early diagnosis and treatment give you the best chance of managing medical conditions that are common after age 41. Certain conditions and lifestyle choices may make you more likely to have a fall. Your health care provider may recommend:  Regular vision checks. Poor vision and conditions such as cataracts can make you more likely to have a fall. If you wear glasses, make sure to get your prescription updated if your vision changes.  Medicine review. Work with your health care provider to regularly review all of the medicines you are taking, including over-the-counter medicines. Ask your health care provider about any side effects that may make you more likely to have a fall. Tell your health care provider if any medicines that you take make you feel dizzy or sleepy.  Osteoporosis screening. Osteoporosis is a condition that causes the bones to get weaker. This can make the bones weak and cause them to break more easily.  Blood pressure screening. Blood pressure changes and medicines to control blood pressure can make you  feel dizzy.  Strength and balance checks. Your health care provider may recommend certain tests to check your strength and balance while standing, walking, or changing positions.  Foot health exam. Foot pain and numbness, as well as not wearing proper footwear, can make you more likely to have a fall.  Depression screening. You may be more likely to have a fall if you have a fear of falling, feel emotionally low, or feel unable to do activities that you used to do.  Alcohol use screening. Using too much alcohol can affect your balance and may make you more likely to have a fall. What actions can I take to lower my risk of falls? General instructions  Talk with your health care provider about your risks for falling. Tell your health care provider if: ? You fall. Be sure to tell your health care provider about all falls, even ones that seem minor. ? You feel dizzy, sleepy, or off-balance.  Take over-the-counter and prescription medicines only as told by your health care provider. These include any supplements.  Eat a healthy diet and maintain a healthy weight. A healthy diet includes low-fat dairy products, low-fat (lean) meats, and fiber from whole grains, beans, and lots of fruits and vegetables. Home safety  Remove any tripping hazards, such as rugs, cords, and clutter.  Install safety equipment such as grab bars in bathrooms and safety rails on stairs.  Keep rooms and walkways well-lit. Activity   Follow a regular exercise program to stay fit. This will help you  maintain your balance. Ask your health care provider what types of exercise are appropriate for you.  If you need a cane or walker, use it as recommended by your health care provider.  Wear supportive shoes that have nonskid soles. Lifestyle  Do not drink alcohol if your health care provider tells you not to drink.  If you drink alcohol, limit how much you have: ? 0-1 drink a day for women. ? 0-2 drinks a day for  men.  Be aware of how much alcohol is in your drink. In the [place] one drink equals one typical bottle of beer (12 oz), one-half glass of wine (5 oz), or one shot of hard liquor (1 oz).  Do not use any products that contain nicotine or tobacco, such as cigarettes and e-cigarettes. If you need help quitting, ask your health care provider. Summary  Having a healthy lifestyle and getting preventive care can help to protect your health and wellness after age 78.  Screening and testing are the best way to find a health problem early and help you avoid having a fall. Early diagnosis and treatment give you the best chance for managing medical conditions that are more common for people who are older than age 37.  Falls are a major cause of broken bones and head injuries in people who are older than age 43. Take precautions to prevent a fall at home.  Work with your health care provider to learn what changes you can make to improve your health and wellness and to prevent falls. This information is not intended to replace advice given to you by your health care provider. Make sure you discuss any questions you have with your health care provider. Document Revised: 08/08/2018 Document Reviewed: 02/28/2017 Elsevier Patient Education  2020 [employer].

## 2019-08-04 NOTE — Telephone Encounter (Signed)
Referral place at Cancer center, they will call to schedule.

## 2019-08-04 NOTE — Progress Notes (Signed)
Tara Summers 04/24/1951 [id number]    History:    Presents for breast and annual examTVH with BSO for fibroids on no HRPap history.  11/2006 right breast cancer lumpectomy and radiation.  Normal mammograms after.  Has not had BRCA testing.  2017 benign colon polyp 5-year follow-up.  2019 T score -1.3 at femoral neck FRAX 2.4% / 0.2%.  Primary care manages hypertension, hypercholesteremia, diabetes, GERD.  Was hospitalized 06/2019 for CHF.  Has had problems with pedal edema in the past, currently +4.  Vaccines current, planning to get Covid vaccine.  Past medical history, past surgical history, family history and social history were all reviewed and documented in the EPIC chart.  Does ministry work.  4 daughters and 1 adopted daughter and 5 grandchildren.  ROS:  A ROS was performed and pertinent positives and negatives are included.  Exam:  Vitals:   08/04/19 1120  BP: 126/80  Weight: 242 lb (109.8 kg)  Height: 5' 1"  (1.549 m)   Body mass index is 45.73 kg/m.   General appearance:  Normal Thyroid:  Symmetrical, normal in size, without palpable masses or nodularity. Respiratory  Auscultation:  Clear without wheezing or rhonchi Cardiovascular  Auscultation:  Regular rate, without rubs, murmurs or gallops  Edema/varicosities: +4 pedal edema Abdominal  Soft,nontender, without masses, guarding or rebound.  Liver/spleen:  No organomegaly noted  Hernia:  None appreciated  Skin  Inspection:  Grossly normal   Breasts: Examined lying and sitting.     Right: 6 o'clock position well-healed lumpectomy sites, no masses, retractions, discharge or axillary adenopathy.     Left: Without masses, retractions, discharge or axillary adenopathy. Gentitourinary   Inguinal/mons:  Normal without inguinal adenopathy  External genitalia:  Normal  BUS/Urethra/Skene's glands:  Normal  Vagina:  Normal  Cervix: And uterus absent   Adnexa/parametria:     Rt: Without masses or tenderness.   Lt: Without  masses or tenderness.  Anus and perineum: Normal  Digital rectal exam: Normal sphincter tone without palpated masses or tenderness  Assessment/Plan:  69 y.o. MBF G7 P4 +1 adopted daughter for breast and pelvic exam with no complaints of discharge, urinary symptoms, abdominal pain or dyspareunia.  TVH with BSO for fibroids no HRT 2008 right breast cancer Osteopenia without elevated FRAX 06/2019 CHF-cardiologist managing Hypertension, hypercholesteremia, diabetes, GERD,-primary care managing labs and meds   Plan: Genetic counseling discussed, would like to discuss with genetic counselor BRCA testing will get scheduled.  SBEs, continue annual 3D screening mammogram, calcium rich foods, vitamin D 2000 IUs daily.  Increase exercise/walking as able.  Aware of need to make dietary changes, low-carb, low-sodium diet has a follow-up appointment with nutritionist.  Aware follow-up colonoscopy due next year.  Repeat DEXA.  Weightbearing and balance type exercise encouraged.    Huel Cote [facility], 1:07 PM 08/04/2019

## 2019-08-06 ENCOUNTER — Other Ambulatory Visit: Payer: Self-pay | Admitting: Women's Health

## 2019-08-06 DIAGNOSIS — M8580 Other specified disorders of bone density and structure, unspecified site: Secondary | ICD-10-CM

## 2019-08-11 ENCOUNTER — Telehealth: Payer: Self-pay | Admitting: Genetic Counselor

## 2019-08-11 NOTE — Telephone Encounter (Signed)
Received a genetic counseling referral from [facility] for personal hx of breast cancer. Pt has been cld and scheduled to see Raquel Sarna on 4/22 at 10am. Pt stated she wanted to talk to the counselor at this time and not pursue testing. She's aware to arrive 15 minutes early.

## 2019-08-11 NOTE — Telephone Encounter (Signed)
Referral coordinator for cancer center is no able to reach patient her voicemail is not set up. I tried to call patient today and voicemail is still not set up to leave a message.

## 2019-08-11 NOTE — Telephone Encounter (Signed)
Patient scheduled on 08/21/19 @ 10:00am

## 2019-08-21 ENCOUNTER — Inpatient Hospital Stay: Payer: Medicare PPO | Admitting: Genetic Counselor

## 2019-09-26 ENCOUNTER — Emergency Department (HOSPITAL_COMMUNITY): Payer: Medicare PPO

## 2019-09-26 ENCOUNTER — Encounter (HOSPITAL_COMMUNITY): Payer: Self-pay

## 2019-09-26 ENCOUNTER — Emergency Department (HOSPITAL_COMMUNITY)
Admission: EM | Admit: 2019-09-26 | Discharge: 2019-09-26 | Disposition: A | Discharge status: Home / Self Care | Payer: Medicare PPO | Attending: Emergency Medicine | Admitting: Emergency Medicine

## 2019-09-26 ENCOUNTER — Other Ambulatory Visit: Payer: Self-pay

## 2019-09-26 DIAGNOSIS — B349 Viral infection, unspecified: Secondary | ICD-10-CM | POA: Diagnosis not present

## 2019-09-26 DIAGNOSIS — Z79899 Other long term (current) drug therapy: Secondary | ICD-10-CM | POA: Diagnosis not present

## 2019-09-26 DIAGNOSIS — Z7984 Long term (current) use of oral hypoglycemic drugs: Secondary | ICD-10-CM | POA: Diagnosis not present

## 2019-09-26 DIAGNOSIS — E119 Type 2 diabetes mellitus without complications: Secondary | ICD-10-CM | POA: Insufficient documentation

## 2019-09-26 DIAGNOSIS — R0602 Shortness of breath: Secondary | ICD-10-CM | POA: Diagnosis present

## 2019-09-26 DIAGNOSIS — R509 Fever, unspecified: Secondary | ICD-10-CM | POA: Insufficient documentation

## 2019-09-26 DIAGNOSIS — R110 Nausea: Secondary | ICD-10-CM | POA: Diagnosis not present

## 2019-09-26 DIAGNOSIS — I110 Hypertensive heart disease with heart failure: Secondary | ICD-10-CM | POA: Insufficient documentation

## 2019-09-26 DIAGNOSIS — I509 Heart failure, unspecified: Secondary | ICD-10-CM | POA: Insufficient documentation

## 2019-09-26 LAB — COMPREHENSIVE METABOLIC PANEL
ALT: 19 U/L (ref 0–44)
AST: 34 U/L (ref 15–41)
Albumin: 2.2 g/dL — ABNORMAL LOW (ref 3.5–5.0)
Alkaline Phosphatase: 67 U/L (ref 38–126)
Anion gap: 11 (ref 5–15)
BUN: 15 mg/dL (ref 8–23)
CO2: 27 mmol/L (ref 22–32)
Calcium: 8.5 mg/dL — ABNORMAL LOW (ref 8.9–10.3)
Chloride: 101 mmol/L (ref 98–111)
Creatinine, Ser: 0.93 mg/dL (ref 0.44–1.00)
GFR calc Af Amer: >60 mL/min (ref >60)
GFR calc non Af Amer: >60 mL/min (ref >60)
Glucose, Bld: 117 mg/dL — ABNORMAL HIGH (ref 70–99)
Potassium: 4.3 mmol/L (ref 3.5–5.1)
Sodium: 139 mmol/L (ref 135–145)
Total Bilirubin: 1.1 mg/dL (ref 0.3–1.2)
Total Protein: 5.9 g/dL — ABNORMAL LOW (ref 6.5–8.1)

## 2019-09-26 LAB — CBC WITH DIFFERENTIAL/PLATELET
Abs Immature Granulocytes: 0.03 10*3/uL (ref 0.00–0.07)
Basophils Absolute: 0.1 10*3/uL (ref 0.0–0.1)
Basophils Relative: 1 %
Eosinophils Absolute: 0.5 10*3/uL (ref 0.0–0.5)
Eosinophils Relative: 5 %
HCT: 36.7 % (ref 36.0–46.0)
Hemoglobin: 11.7 g/dL — ABNORMAL LOW (ref 12.0–15.0)
Immature Granulocytes: 0 %
Lymphocytes Relative: 19 %
Lymphs Abs: 1.9 10*3/uL (ref 0.7–4.0)
MCH: 28 pg (ref 26.0–34.0)
MCHC: 31.9 g/dL (ref 30.0–36.0)
MCV: 87.8 fL (ref 80.0–100.0)
Monocytes Absolute: 0.7 10*3/uL (ref 0.1–1.0)
Monocytes Relative: 7 %
Neutro Abs: 6.6 10*3/uL (ref 1.7–7.7)
Neutrophils Relative %: 68 %
Platelets: 265 10*3/uL (ref 150–400)
RBC: 4.18 MIL/uL (ref 3.87–5.11)
RDW: 12.5 % (ref 11.5–15.5)
WBC: 9.8 10*3/uL (ref 4.0–10.5)
nRBC: 0 % (ref 0.0–0.2)

## 2019-09-26 LAB — TROPONIN I (HIGH SENSITIVITY)
Troponin I (High Sensitivity): 14 ng/L (ref <18)
Troponin I (High Sensitivity): 19 ng/L — ABNORMAL HIGH (ref <18)

## 2019-09-26 LAB — BRAIN NATRIURETIC PEPTIDE: B Natriuretic Peptide: 119.5 pg/mL — ABNORMAL HIGH (ref 0.0–100.0)

## 2019-09-26 MED ORDER — METOPROLOL TARTRATE 25 MG PO TABS
25.0000 mg | ORAL_TABLET | Freq: Once | ORAL | Status: AC
  Administered 2019-09-26: 25 mg via ORAL
  Filled 2019-09-26: qty 1

## 2019-09-26 MED ORDER — LOSARTAN POTASSIUM 50 MG PO TABS
50.0000 mg | ORAL_TABLET | Freq: Once | ORAL | Status: AC
  Administered 2019-09-26: 50 mg via ORAL
  Filled 2019-09-26: qty 1

## 2019-09-26 MED ORDER — BENZONATATE 100 MG PO CAPS: 100.0000 mg | ORAL_CAPSULE | Freq: Three times a day (TID) | ORAL | 0 refills | Status: DC

## 2019-09-26 NOTE — ED Triage Notes (Signed)
Pt reports that since yesterday she has been having body aches, headache, SOB, cough and nausea. Seen PCP today and told to come here

## 2019-09-26 NOTE — Discharge Instructions (Signed)
You were evaluated in the Emergency Department and after careful evaluation, we did not find any emergent condition requiring admission or further testing in the hospital.  Your exam/testing today was overall reassuring.  Your symptoms seem to be due to a viral illness.  Please take your daily medications.  Practice home isolation as we discussed until you have a negative Covid test.  You can use the Tessalon medication as needed for cough.  Please return to the Emergency Department if you experience any worsening of your condition.  We encourage you to follow up with a primary care provider.  Thank you for allowing [place] to be a part of your care.

## 2019-09-26 NOTE — ED Provider Notes (Signed)
MSE was initiated and I personally evaluated the patient and placed orders (if any) at  6:48 PM on Sep 26, 2019.  The patient appears stable so that the remainder of the MSE may be completed by another provider. Pt short of breath.  Dr. Doylene Canard sent pt here for evaluation of chf.  PE: 02 sat 93% Lungs decreased breath sounds heart tachy    Ekg, chest xray, labs and IV ordered    Tara Summers 09/26/19 1850    Maudie Flakes, MD 10/01/19 [phone]
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
## 2020-04-16 ENCOUNTER — Emergency Department (HOSPITAL_COMMUNITY)
Admission: EM | Admit: 2020-04-16 | Discharge: 2020-04-16 | Disposition: A | Discharge status: Home / Self Care | Payer: Medicare PPO | Attending: Emergency Medicine | Admitting: Emergency Medicine

## 2020-04-16 ENCOUNTER — Emergency Department (HOSPITAL_BASED_OUTPATIENT_CLINIC_OR_DEPARTMENT_OTHER): Payer: Medicare PPO

## 2020-04-16 ENCOUNTER — Encounter (HOSPITAL_COMMUNITY): Payer: Self-pay

## 2020-04-16 ENCOUNTER — Other Ambulatory Visit: Payer: Self-pay

## 2020-04-16 DIAGNOSIS — R600 Localized edema: Secondary | ICD-10-CM | POA: Diagnosis not present

## 2020-04-16 DIAGNOSIS — Z7984 Long term (current) use of oral hypoglycemic drugs: Secondary | ICD-10-CM | POA: Diagnosis not present

## 2020-04-16 DIAGNOSIS — Z7982 Long term (current) use of aspirin: Secondary | ICD-10-CM | POA: Diagnosis not present

## 2020-04-16 DIAGNOSIS — Z7901 Long term (current) use of anticoagulants: Secondary | ICD-10-CM | POA: Diagnosis not present

## 2020-04-16 DIAGNOSIS — R52 Pain, unspecified: Secondary | ICD-10-CM | POA: Diagnosis not present

## 2020-04-16 DIAGNOSIS — M7989 Other specified soft tissue disorders: Secondary | ICD-10-CM

## 2020-04-16 DIAGNOSIS — M79605 Pain in left leg: Secondary | ICD-10-CM | POA: Insufficient documentation

## 2020-04-16 DIAGNOSIS — I5021 Acute systolic (congestive) heart failure: Secondary | ICD-10-CM | POA: Diagnosis not present

## 2020-04-16 DIAGNOSIS — M79604 Pain in right leg: Secondary | ICD-10-CM | POA: Insufficient documentation

## 2020-04-16 DIAGNOSIS — I110 Hypertensive heart disease with heart failure: Secondary | ICD-10-CM | POA: Insufficient documentation

## 2020-04-16 DIAGNOSIS — Z79899 Other long term (current) drug therapy: Secondary | ICD-10-CM | POA: Insufficient documentation

## 2020-04-16 DIAGNOSIS — Z87891 Personal history of nicotine dependence: Secondary | ICD-10-CM | POA: Insufficient documentation

## 2020-04-16 DIAGNOSIS — E1140 Type 2 diabetes mellitus with diabetic neuropathy, unspecified: Secondary | ICD-10-CM | POA: Diagnosis not present

## 2020-04-16 MED ORDER — MORPHINE SULFATE (PF) 4 MG/ML IV SOLN
6.0000 mg | Freq: Once | INTRAVENOUS | Status: AC
  Administered 2020-04-16: 16:00:00 6 mg via INTRAMUSCULAR
  Filled 2020-04-16: qty 2

## 2020-04-16 MED ORDER — AMLODIPINE BESYLATE 5 MG PO TABS: 5.0000 mg | ORAL_TABLET | Freq: Every day | ORAL | Status: DC

## 2020-04-16 MED ORDER — AMLODIPINE BESYLATE 5 MG PO TABS
10.0000 mg | ORAL_TABLET | Freq: Once | ORAL | Status: AC
  Administered 2020-04-16: 10 mg via ORAL

## 2020-04-16 MED ORDER — HYDROCODONE-ACETAMINOPHEN 5-325 MG PO TABS: 1.0000 | ORAL_TABLET | Freq: Four times a day (QID) | ORAL | 0 refills | Status: DC | PRN

## 2020-04-16 MED ORDER — LOSARTAN POTASSIUM 25 MG PO TABS
100.0000 mg | ORAL_TABLET | Freq: Every day | ORAL | Status: DC
  Administered 2020-04-16: 16:00:00 100 mg via ORAL
  Filled 2020-04-16: qty 4

## 2020-04-16 MED ORDER — AMLODIPINE BESYLATE 5 MG PO TABS
10.0000 mg | ORAL_TABLET | Freq: Every day | ORAL | Status: DC
  Filled 2020-04-16: qty 2

## 2020-04-16 MED ORDER — METOPROLOL SUCCINATE ER 50 MG PO TB24
25.0000 mg | ORAL_TABLET | Freq: Every day | ORAL | Status: DC
  Administered 2020-04-16: 16:00:00 25 mg via ORAL
  Filled 2020-04-16: qty 1

## 2020-04-16 MED ORDER — OXYCODONE-ACETAMINOPHEN 5-325 MG PO TABS
1.0000 | ORAL_TABLET | Freq: Once | ORAL | Status: AC
  Administered 2020-04-16: 1 via ORAL
  Filled 2020-04-16: qty 1

## 2020-04-16 MED ORDER — HYDRALAZINE HCL 25 MG PO TABS
25.0000 mg | ORAL_TABLET | Freq: Two times a day (BID) | ORAL | Status: DC
  Administered 2020-04-16: 16:00:00 25 mg via ORAL
  Filled 2020-04-16: qty 1

## 2020-04-16 MED ORDER — MORPHINE SULFATE (PF) 4 MG/ML IV SOLN
INTRAVENOUS | Status: AC
  Filled 2020-04-16: qty 2

## 2020-04-16 NOTE — Discharge Instructions (Addendum)
You were seen in the emergency department today for pain in both of your legs.  Your ultrasound test did not show blood clots.  We are sending you home with Norco to help with severe pain.  -Norco -this is a narcotic/controlled substance medication that has potential addicting qualities.  We recommend that you take 1 tablet every 6 hours as needed for severe pain.  Do not drive or operate heavy machinery when taking this medicine as it can be sedating. Do not drink alcohol or take other sedating medications when taking this medicine for safety reasons.  Keep this out of reach of small children.  Please be aware this medicine has Tylenol in it (325 mg/tab) do not exceed the maximum dose of Tylenol in a day per over the counter recommendations should you decide to supplement with Tylenol over the counter.   We have prescribed you new medication(s) today. Discuss the medications prescribed today with your pharmacist as they can have adverse effects and interactions with your other medicines including over the counter and prescribed medications. Seek medical evaluation if you start to experience new or abnormal symptoms after taking one of these medicines, seek care immediately if you start to experience difficulty breathing, feeling of your throat closing, facial swelling, or rash as these could be indications of a more serious allergic reaction  Please try to keep your legs elevated when possible. Your blood pressure was also noted to be elevated in the ER today, please have this rechecked by your primary care provider and be sure to take all your blood pressure medications as prescribed.  Please follow-up with your vascular doctor within 3 days for reevaluation.  Is to follow with your primary care provider.  Please return to the ER for any new or worsening symptoms including but not limited to worsening pain, new pain, inability to walk, discoloration of your legs, your legs feeling cool to the touch,  fever, trouble breathing, coughing up blood, chest pain, passing out, numbness, or any other concerns.

## 2020-04-16 NOTE — ED Triage Notes (Signed)
Patient reports that she went to her PCP today with c/o increased bilateral leg pain. Patient was sent to the ED to r/o DVT.
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
## 2020-04-16 NOTE — ED Provider Notes (Signed)
Medical screening examination/treatment/procedure(s) were conducted as a shared visit with non-physician practitioner(s) and myself.  I personally evaluated the patient during the encounter.     Six 69-year-old female presents with bilateral lower extremity pain but increasing edema.  Seen by physician in sent here for possible DVT studies.  We will Dopplers her legs and likely discharge home.  She has no CHF symptoms.   Lacretia Leigh, MD 04/16/20 1311

## 2020-04-16 NOTE — Progress Notes (Signed)
Lower extremity venous bilateral study completed.  Preliminary results relayed to Zenia Resides, MD.   See CV Proc for preliminary results report.   Darlin Coco, RDMS

## 2020-05-19 IMAGING — RF DG ESOPHAGUS
7 of 8 series · 12 of 16 positions shown · non-contrast
Comparison: Radiograph 01/18/2013

CLINICAL DATA: Dysphagia. Mild gastroesophageal reflux. Reflux
improved after cholecystectomy several years prior.

EXAM:
ESOPHOGRAM / BARIUM SWALLOW / BARIUM TABLET STUDY
TECHNIQUE: Combined double contrast and single contrast examination performed
using effervescent crystals, thick barium liquid, and thin barium
liquid. The patient was observed with fluoroscopy swallowing a 13 mm
barium sulphate tablet.
FLUOROSCOPY TIME:  Fluoroscopy Time:
Radiation Exposure Index (if provided by the fluoroscopic device):
50.5 mGy
Number of Acquired Spot Images: 8

[Series 1: cp_standard · 0.36mm/px · 3 of 82 frames shown]
[frame 1/82]
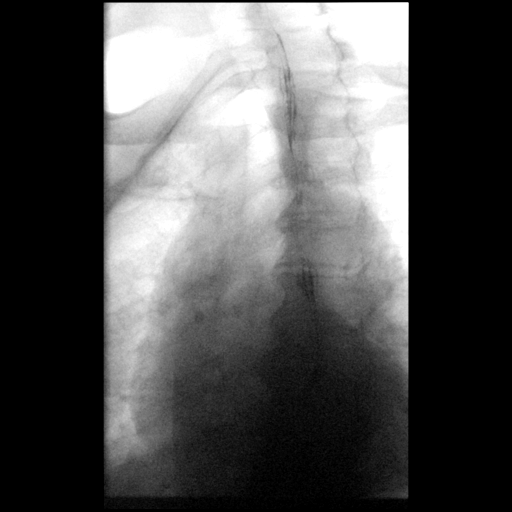
[frame 42/82]
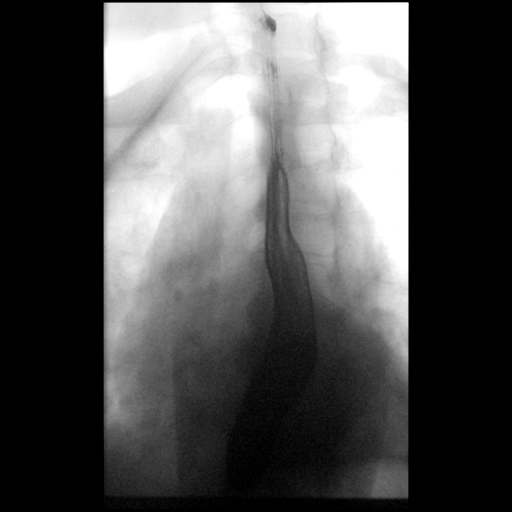
[frame 70/82]
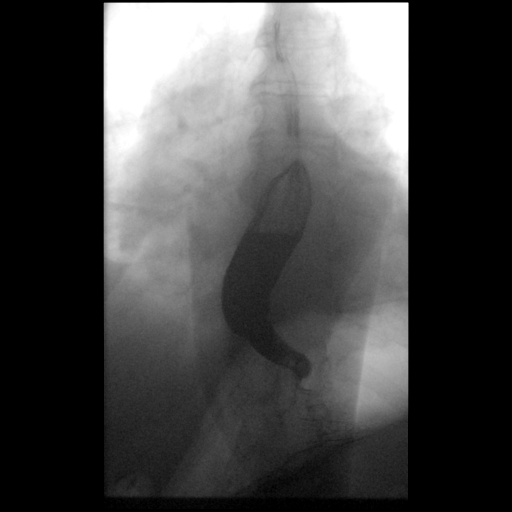

[Series 2: fluoro_barium 2fps_bw · 0.18mm/px · 1 of 1 slices shown (1 of 6)]
[im 1/1]
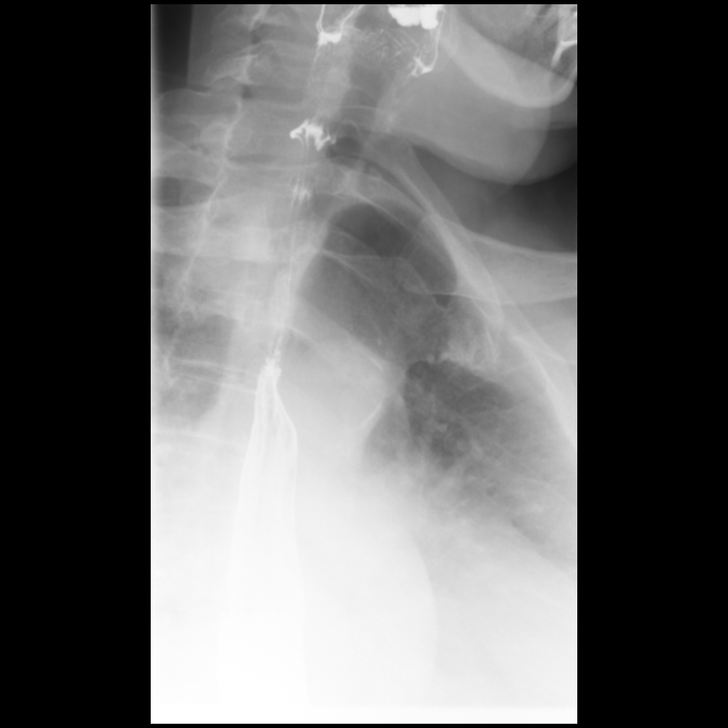

[Series 3: fluoro_barium 2fps_bw · 0.18mm/px · 2 of 3 frames shown (2 of 6)]
[frame 2/3]
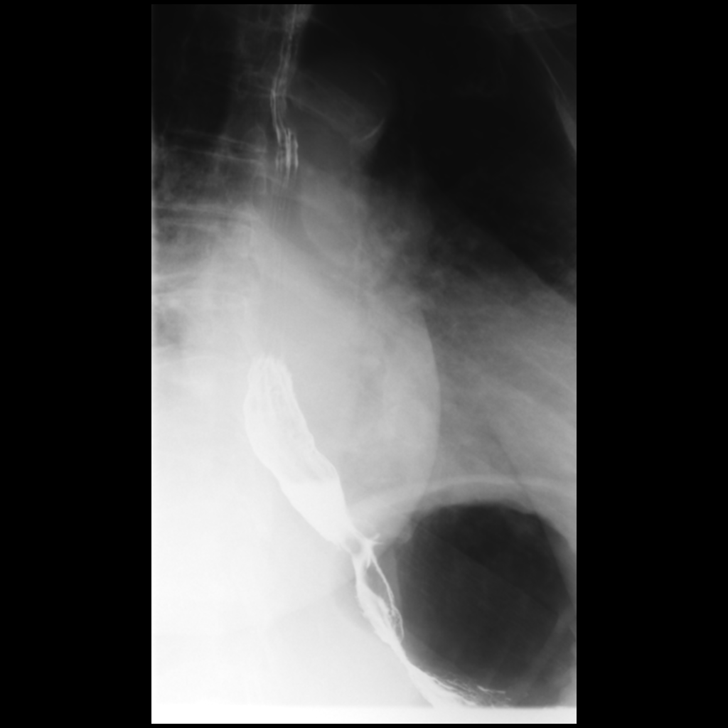
[frame 3/3]
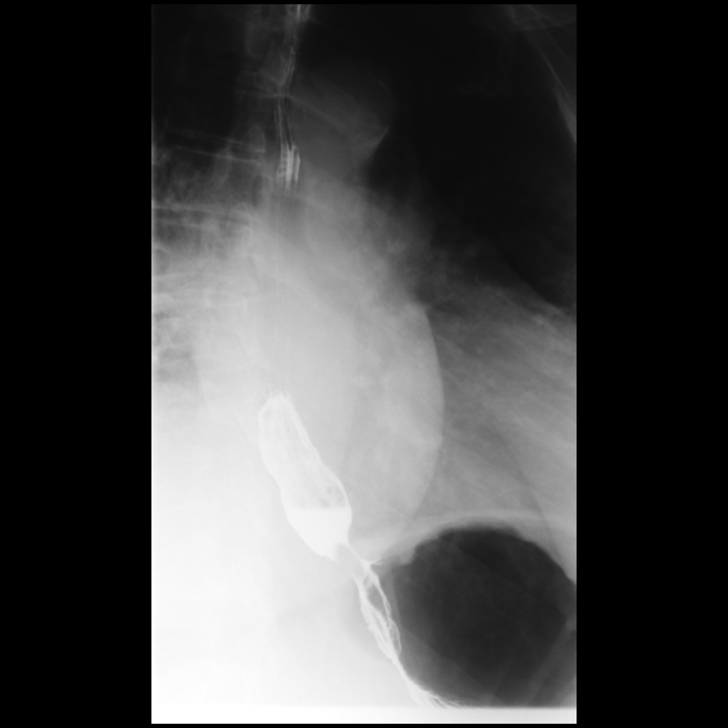

[Series 4: fluoro_barium 2fps_bw · 0.18mm/px · 1 of 1 slices shown (3 of 6)]
[im 1/1]
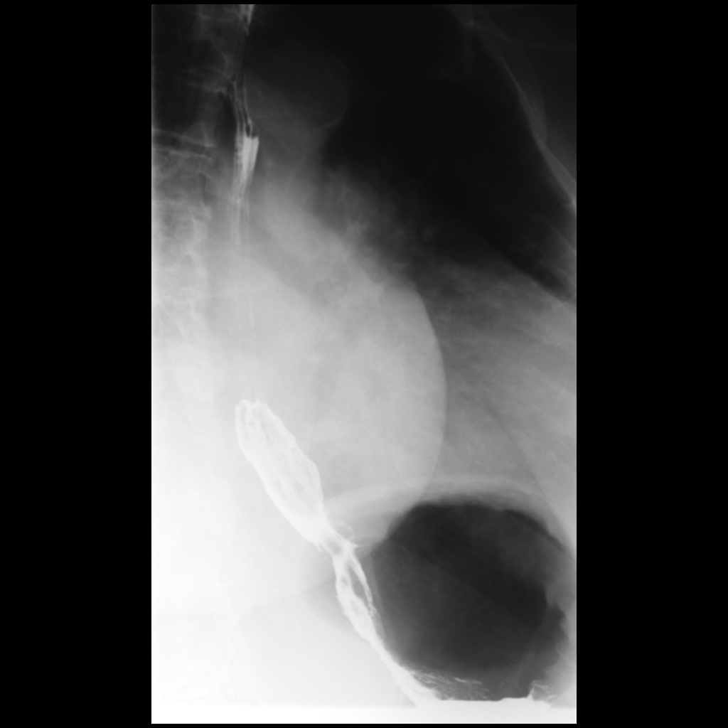

[Series 5: fluoro_barium 2fps_bw · 0.18mm/px · 1 of 2 frames shown (4 of 6)]
[frame 2/2]
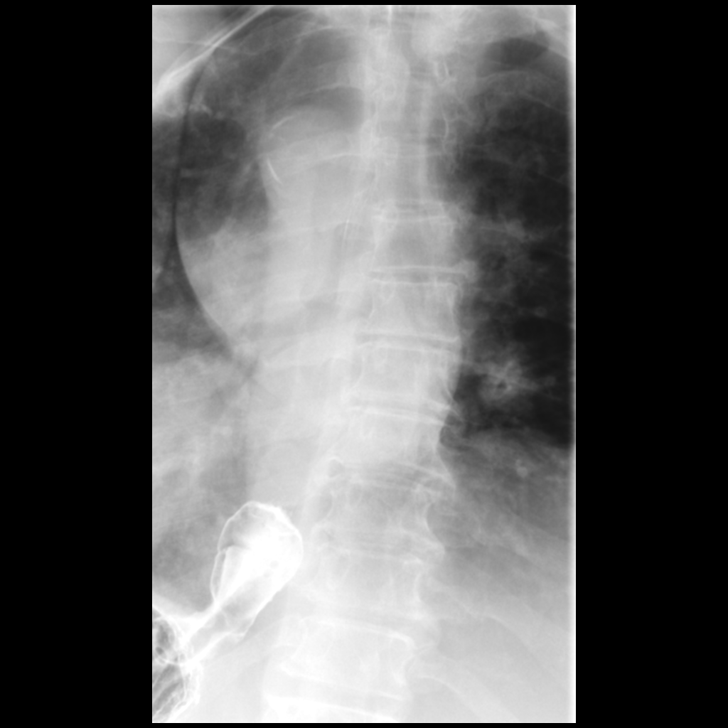

[Series 6: fluoro_barium 2fps_bw · 0.18mm/px · 2 of 2 frames shown (5 of 6)]
[frame 1/2]
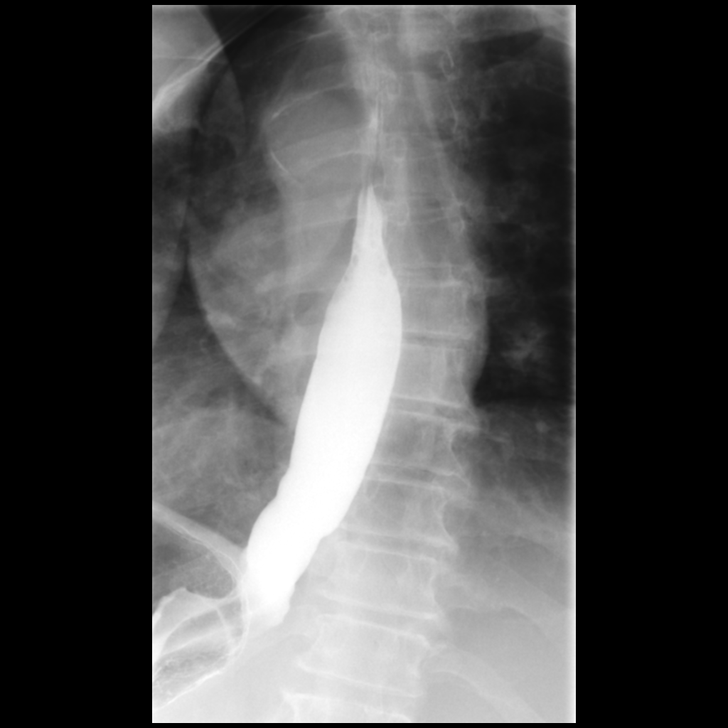
[frame 2/2]
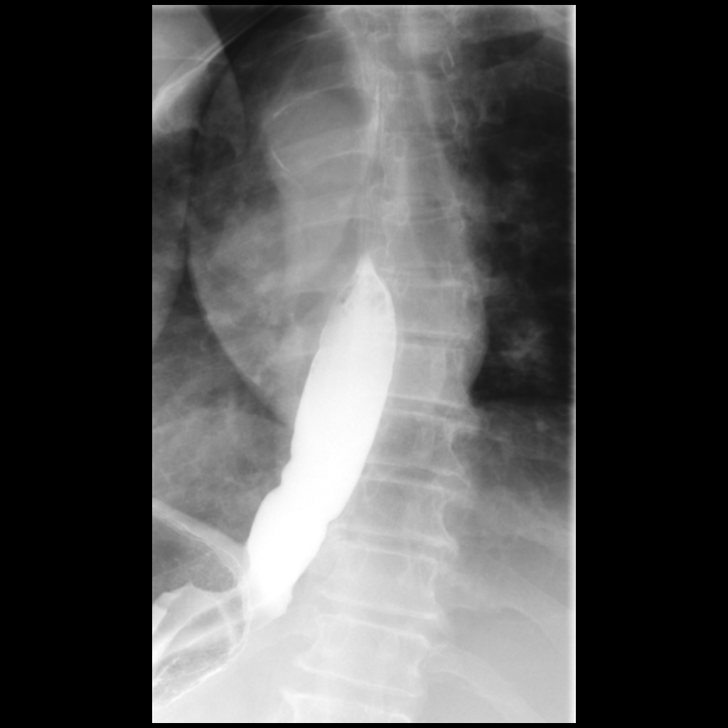

[Series 8: fluoro_barium 2fps_bw · 0.18mm/px · 2 of 2 frames shown (6 of 6)]
[frame 1/2]
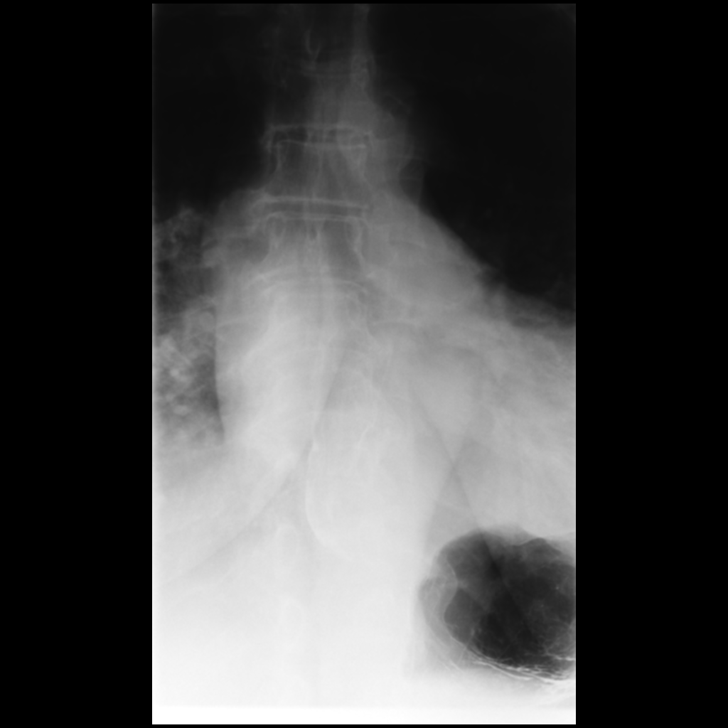
[frame 2/2]
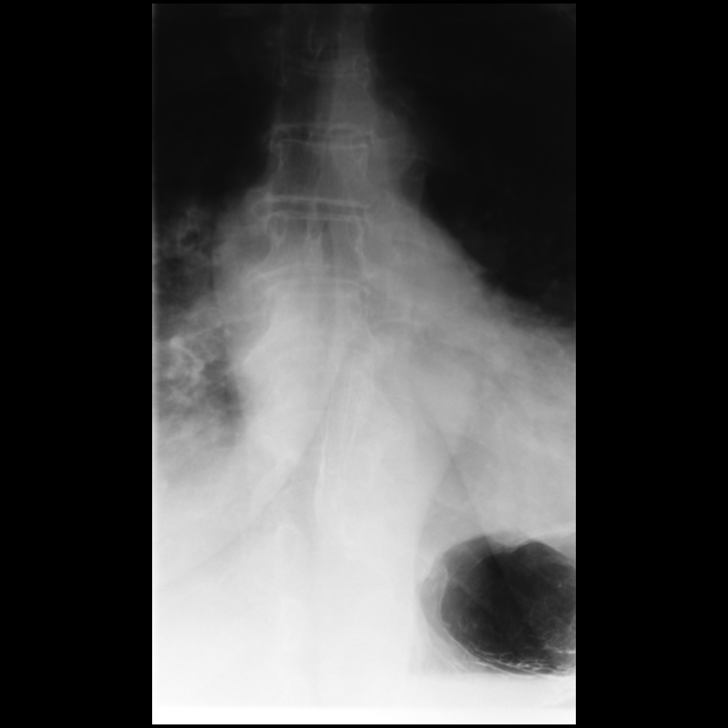

[12 of 16 positions shown; findings below may reference images not displayed]

FINDINGS: Normal swallowing function in the high cervical esophagus. Shallow
esophageal web/indentation measures only 2 mm along the anterior
margin of the cervical esophagus.

No mucosal irregularity stricture mass the thoracic esophagus or
distal esophagus. Gastroesophageal junction is widely patent.

Normal esophageal motility.

Mild gastroesophageal reflux noted.

13 mm thick barium tablet passed GE junction easily.
IMPRESSION: 1. No mucosal irregularity stricture mass the esophagus.
2. Normal esophageal motility.
3. Small shallow (2mm) esophageal web in the proximal esophagus.
4. Mild gastroesophageal reflux.

## 2020-08-31 ENCOUNTER — Ambulatory Visit: Payer: Medicare PPO | Admitting: Nurse Practitioner

## 2020-11-04 ENCOUNTER — Other Ambulatory Visit (HOSPITAL_COMMUNITY): Payer: Self-pay | Admitting: *Deleted

## 2020-11-04 DIAGNOSIS — R1310 Dysphagia, unspecified: Secondary | ICD-10-CM

## 2020-11-18 ENCOUNTER — Other Ambulatory Visit: Payer: Self-pay

## 2020-11-18 ENCOUNTER — Ambulatory Visit (HOSPITAL_COMMUNITY)
Admission: RE | Admit: 2020-11-18 | Discharge: 2020-11-18 | Disposition: A | Discharge status: Home / Self Care | Payer: Medicare PPO | Source: Ambulatory Visit | Attending: Family Medicine | Admitting: Family Medicine

## 2020-11-18 ENCOUNTER — Ambulatory Visit (HOSPITAL_COMMUNITY): Payer: Medicare PPO

## 2020-11-18 ENCOUNTER — Encounter (HOSPITAL_COMMUNITY): Payer: Self-pay

## 2020-11-18 ENCOUNTER — Encounter (INDEPENDENT_AMBULATORY_CARE_PROVIDER_SITE_OTHER): Payer: Self-pay

## 2020-11-18 ENCOUNTER — Encounter (HOSPITAL_COMMUNITY): Payer: Medicare PPO

## 2020-11-18 DIAGNOSIS — R1310 Dysphagia, unspecified: Secondary | ICD-10-CM

## 2020-11-18 NOTE — Procedures (Signed)
SLP Cancellation Note  Patient Details Name: FRANCETTA ILG MRN: [id number] DOB: 1950-07-24   Cancelled treatment:       Reason Eval/Treat Not Completed:  (Per discussion with pt, given h/o esophageal dysphagia per esophagram 12/2017, pt report of odynophagia (pointing to esophagus), pt was under impression she was to have a "barium swallow".  When reviewing option of completing MBS to assess oropharyngeal swallow *not esophagus* or seeking clarification from MD if "esophagram/barium swallow to evaluate esophagus was desired, pt advised her wish to seek clarification and reschedule.)  SLP called office phone number but answering machine picked up and office is closed until 1400.  Requested RT to help with determining test desired by reaching out to MD office, to which she politely advised she would conduct.  SLP is happy to see pt for MBS if this is test Dr Alyson Ingles desired, otherwise please order "esophagram/barium swallow".  Thank you.   Kathleen Lime, MS [facility] SLP Acute Rehab Services Office [phone] Pager [phone]    Macario Golds 11/18/2020, 1:59 PM

## 2020-12-23 ENCOUNTER — Other Ambulatory Visit (HOSPITAL_COMMUNITY): Payer: Self-pay | Admitting: *Deleted

## 2020-12-23 DIAGNOSIS — R1310 Dysphagia, unspecified: Secondary | ICD-10-CM

## 2020-12-28 ENCOUNTER — Ambulatory Visit (HOSPITAL_COMMUNITY)
Admission: RE | Admit: 2020-12-28 | Discharge: 2020-12-28 | Disposition: A | Discharge status: Home / Self Care | Payer: Medicare PPO | Source: Ambulatory Visit | Attending: Family Medicine | Admitting: Family Medicine

## 2020-12-28 ENCOUNTER — Other Ambulatory Visit: Payer: Self-pay

## 2020-12-28 DIAGNOSIS — R1310 Dysphagia, unspecified: Secondary | ICD-10-CM | POA: Insufficient documentation

## 2020-12-29 ENCOUNTER — Other Ambulatory Visit: Payer: Self-pay | Admitting: Family Medicine

## 2020-12-29 DIAGNOSIS — R1900 Intra-abdominal and pelvic swelling, mass and lump, unspecified site: Secondary | ICD-10-CM

## 2020-12-29 DIAGNOSIS — R109 Unspecified abdominal pain: Secondary | ICD-10-CM

## 2021-01-11 ENCOUNTER — Ambulatory Visit
Admission: RE | Admit: 2021-01-11 | Discharge: 2021-01-11 | Disposition: A | Discharge status: Home / Self Care | Payer: Medicare PPO | Source: Ambulatory Visit | Attending: Family Medicine | Admitting: Family Medicine

## 2021-01-11 DIAGNOSIS — R1900 Intra-abdominal and pelvic swelling, mass and lump, unspecified site: Secondary | ICD-10-CM

## 2021-01-11 DIAGNOSIS — R109 Unspecified abdominal pain: Secondary | ICD-10-CM

## 2021-03-29 ENCOUNTER — Other Ambulatory Visit: Payer: Self-pay | Admitting: Gastroenterology

## 2021-03-29 DIAGNOSIS — R1084 Generalized abdominal pain: Secondary | ICD-10-CM

## 2021-04-21 ENCOUNTER — Ambulatory Visit
Admission: RE | Admit: 2021-04-21 | Discharge: 2021-04-21 | Disposition: A | Discharge status: Home / Self Care | Payer: Medicare PPO | Source: Ambulatory Visit | Attending: Gastroenterology | Admitting: Gastroenterology

## 2021-04-21 ENCOUNTER — Other Ambulatory Visit: Payer: Self-pay

## 2021-04-21 DIAGNOSIS — R1084 Generalized abdominal pain: Secondary | ICD-10-CM

## 2021-04-21 MED ORDER — IOPAMIDOL (ISOVUE-300) INJECTION 61%
100.0000 mL | Freq: Once | INTRAVENOUS | Status: AC | PRN
  Administered 2021-04-21: 11:00:00 100 mL via INTRAVENOUS

## 2022-01-27 ENCOUNTER — Other Ambulatory Visit: Payer: Self-pay | Admitting: Surgery

## 2022-02-22 ENCOUNTER — Encounter (HOSPITAL_BASED_OUTPATIENT_CLINIC_OR_DEPARTMENT_OTHER): Payer: Self-pay | Admitting: Surgery

## 2022-02-22 NOTE — Progress Notes (Addendum)
   02/22/22 1333  PAT Phone Screen  Do You Have Diabetes? Yes  Do You Have Hypertension? Yes  Have You Ever Been to the ER for Asthma? No  Have You Taken Oral Steroids in the Past 3 Months? No  Do you Take Phenteramine or any Other Diet Drugs? No  Recent  Lab Work, EKG, CXR? No  Do you have a history of heart problems? (S)  Cardiologist (Dr. Johnell Comings (per pt saw 02/16/22- no epic visit)  Have You Ever Had Tests on Your Heart? Yes  Where? echo in 2021, EKG > year ago.  Any Recent Hospitalizations? No  Height 5' (1.524 m)  Weight 106.1 kg  Pat Appointment Scheduled (S)  Yes (BMP+EKG+ERAS)   LVM with Noela at Dr. Trevor Mace office to request cardiac clearance- pt states she saw her cardiologist 02/16/22 but there is no visit in epic.

## 2022-02-28 ENCOUNTER — Encounter (HOSPITAL_BASED_OUTPATIENT_CLINIC_OR_DEPARTMENT_OTHER)
Admission: RE | Admit: 2022-02-28 | Discharge: 2022-02-28 | Disposition: A | Discharge status: Home / Self Care | Payer: Medicare PPO | Source: Ambulatory Visit | Attending: Surgery | Admitting: Surgery

## 2022-02-28 DIAGNOSIS — Z01818 Encounter for other preprocedural examination: Secondary | ICD-10-CM | POA: Diagnosis not present

## 2022-02-28 LAB — BASIC METABOLIC PANEL
Anion gap: 9 (ref 5–15)
BUN: 21 mg/dL (ref 8–23)
CO2: 27 mmol/L (ref 22–32)
Calcium: 9.4 mg/dL (ref 8.9–10.3)
Chloride: 107 mmol/L (ref 98–111)
Creatinine, Ser: 0.98 mg/dL (ref 0.44–1.00)
GFR, Estimated: >60 mL/min (ref >60)
Glucose, Bld: 123 mg/dL — ABNORMAL HIGH (ref 70–99)
Potassium: 4.4 mmol/L (ref 3.5–5.1)
Sodium: 143 mmol/L (ref 135–145)

## 2022-02-28 MED ORDER — ENSURE PRE-SURGERY PO LIQD: 296.0000 mL | Freq: Once | ORAL | Status: DC

## 2022-02-28 NOTE — Progress Notes (Signed)

## 2022-03-01 NOTE — Anesthesia Preprocedure Evaluation (Signed)
Anesthesia Evaluation  Patient identified by MRN, date of birth, ID band Patient awake    Reviewed: Allergy & Precautions, NPO status , Patient's Chart, lab work & pertinent test results  History of Anesthesia Complications Negative for: history of anesthetic complications  Airway Mallampati: I  TM Distance: >3 FB Neck ROM: Full    Dental  (+) Dental Advisory Given, Edentulous Upper, Partial Upper   Pulmonary sleep apnea , former smoker   Pulmonary exam normal        Cardiovascular hypertension, Pt. on medications Normal cardiovascular exam     Neuro/Psych negative neurological ROS     GI/Hepatic Neg liver ROS,GERD  ,,  Endo/Other  diabetes  Morbid obesity  Renal/GU negative Renal ROS     Musculoskeletal negative musculoskeletal ROS (+)    Abdominal   Peds  Hematology negative hematology ROS (+)   Anesthesia Other Findings   Reproductive/Obstetrics                             Anesthesia Physical Anesthesia Plan  ASA: 3  Anesthesia Plan: General   Post-op Pain Management: Celebrex PO (pre-op)* and Tylenol PO (pre-op)*   Induction: Intravenous  PONV Risk Score and Plan: 4 or greater and Ondansetron, Dexamethasone, Midazolam and Scopolamine patch - Pre-op  Airway Management Planned: Oral ETT  Additional Equipment:   Intra-op Plan:   Post-operative Plan: Extubation in OR  Informed Consent: I have reviewed the patients History and Physical, chart, labs and discussed the procedure including the risks, benefits and alternatives for the proposed anesthesia with the patient or authorized representative who has indicated his/her understanding and acceptance.     Dental advisory given  Plan Discussed with: Anesthesiologist and CRNA  Anesthesia Plan Comments:         Anesthesia Quick Evaluation

## 2022-03-01 NOTE — H&P (Signed)
REFERRING PHYSICIAN: Self  PROVIDER: Beverlee Nims, MD  MRN: [id number] DOB: 26-Jan-1951  Subjective   Chief Complaint: New Consultation (Hernia)   History of Present Illness: Tara Summers is a 71 y.o. female who is seen  as an office consultation for evaluation of New Consultation (Hernia) .   This is a pleasant 71 year old female who I have operated on in the past. I performed a laparoscopic cholecystectomy on her in 2017. Recently she has been having abdominal pain around her umbilicus. Last year she had a CAT scan of her abdomen pelvis for other reasons which did show a small umbilical hernia containing fat. She reports that she will get bloating occasionally when she eats and some nausea from the discomfort. Most of her pain is sharp and only with activity especially bending and lifting. She has had no emesis and currently has no pain today. She reports no significant change in her medical care. She reports no current pulmonary problems.  Review of Systems: A complete review of systems was obtained from the patient. I have reviewed this information and discussed as appropriate with the patient. See HPI as well for other ROS.  ROS   Medical History: Past Medical History:  Diagnosis Date  GERD (gastroesophageal reflux disease)  History of cancer  Sleep apnea   There is no problem list on file for this patient.  Past Surgical History:  Procedure Laterality Date  breast surgery  HYSTERECTOMY  LAPAROSCOPIC CHOLECYSTECTOMY    No Known Allergies  Current Outpatient Medications on File Prior to Visit  Medication Sig Dispense Refill  amLODIPine (NORVASC) 5 MG tablet Take 5 mg by mouth once daily  atorvastatin (LIPITOR) 40 MG tablet Take 40 mg by mouth once daily  hydrALAZINE (APRESOLINE) 25 MG tablet Take 25 mg by mouth 2 (two) times daily  losartan (COZAAR) 100 MG tablet Take 100 mg by mouth once daily  metFORMIN (GLUCOPHAGE) 500 MG tablet Take by mouth  OZEMPIC  0.25 mg or 0.5 mg(2 mg/1.5 mL) pen injector INJECT 0.25 MG SUBCUTANEOUSLY WEEKLY   No current facility-administered medications on file prior to visit.   History reviewed. No pertinent family history.   Social History   Tobacco Use  Smoking Status Never  Smokeless Tobacco Never    Social History   Socioeconomic History  Marital status: Married  Tobacco Use  Smoking status: Never  Smokeless tobacco: Never  Substance and Sexual Activity  Alcohol use: Not Currently  Drug use: Never   Objective:   Vitals:  BP: (!) 140/90  Pulse: 91  Temp: 36.6 C (97.9 F)  SpO2: 98%  Weight: (!) 106.6 kg (235 lb)  Height: 152.4 cm (5')   Body mass index is 45.9 kg/m.  Physical Exam   She appears well on exam  Her abdomen is soft and morbidly obese. There is an easily reducible but tender hernia at the umbilicus. The fascial defect is approximately 1 cm. The rest of her abdomen is soft and nontender  There is no hepatomegaly  Labs, Imaging and Diagnostic Testing: I have reviewed her CT scan from July of last year showing the small umbilical hernia  Assessment and Plan:   Diagnoses and all orders for this visit:  Umbilical hernia without obstruction and without gangrene    This is an umbilical hernia with a 1 cm fascial defect. Because she is symptomatic, repair of hernias recommended. I discussed proceeding with open hernia repair with mesh. I discussed the reasons to use mesh. I explained the  procedure in detail. We discussed the risks which includes but is not limited to bleeding, infection, injury to surrounding structures, the use of mesh, hernia recurrence, cardiopulmonary issues with surgery, postoperative recovery, etc. She understands and wished to proceed with surgery which will be scheduled. Given her previous history of heart failure, we will likely need cardiac clearance preoperatively

## 2022-03-02 ENCOUNTER — Encounter (HOSPITAL_BASED_OUTPATIENT_CLINIC_OR_DEPARTMENT_OTHER)
Admission: RE | Disposition: A | Discharge status: Home / Self Care | Payer: Self-pay | Source: Home / Self Care | Attending: Surgery

## 2022-03-02 ENCOUNTER — Encounter (HOSPITAL_BASED_OUTPATIENT_CLINIC_OR_DEPARTMENT_OTHER): Payer: Self-pay | Admitting: Surgery

## 2022-03-02 ENCOUNTER — Ambulatory Visit (HOSPITAL_BASED_OUTPATIENT_CLINIC_OR_DEPARTMENT_OTHER): Payer: Medicare PPO | Admitting: Anesthesiology

## 2022-03-02 ENCOUNTER — Other Ambulatory Visit: Payer: Self-pay

## 2022-03-02 ENCOUNTER — Ambulatory Visit (HOSPITAL_BASED_OUTPATIENT_CLINIC_OR_DEPARTMENT_OTHER)
Admission: RE | Admit: 2022-03-02 | Discharge: 2022-03-02 | Disposition: A | Discharge status: Home / Self Care | Payer: Medicare PPO | Attending: Surgery | Admitting: Surgery

## 2022-03-02 DIAGNOSIS — I10 Essential (primary) hypertension: Secondary | ICD-10-CM | POA: Diagnosis not present

## 2022-03-02 DIAGNOSIS — G4730 Sleep apnea, unspecified: Secondary | ICD-10-CM | POA: Diagnosis not present

## 2022-03-02 DIAGNOSIS — Z87891 Personal history of nicotine dependence: Secondary | ICD-10-CM | POA: Diagnosis not present

## 2022-03-02 DIAGNOSIS — K420 Umbilical hernia with obstruction, without gangrene: Secondary | ICD-10-CM | POA: Diagnosis present

## 2022-03-02 DIAGNOSIS — Z01818 Encounter for other preprocedural examination: Secondary | ICD-10-CM

## 2022-03-02 DIAGNOSIS — Z6841 Body Mass Index (BMI) 40.0 and over, adult: Secondary | ICD-10-CM | POA: Diagnosis not present

## 2022-03-02 DIAGNOSIS — K219 Gastro-esophageal reflux disease without esophagitis: Secondary | ICD-10-CM | POA: Diagnosis not present

## 2022-03-02 DIAGNOSIS — K429 Umbilical hernia without obstruction or gangrene: Secondary | ICD-10-CM | POA: Diagnosis not present

## 2022-03-02 DIAGNOSIS — E119 Type 2 diabetes mellitus without complications: Secondary | ICD-10-CM | POA: Insufficient documentation

## 2022-03-02 DIAGNOSIS — Z7984 Long term (current) use of oral hypoglycemic drugs: Secondary | ICD-10-CM | POA: Diagnosis not present

## 2022-03-02 HISTORY — PX: UMBILICAL HERNIA REPAIR: SHX196

## 2022-03-02 LAB — GLUCOSE, CAPILLARY
Glucose-Capillary: 127 mg/dL — ABNORMAL HIGH (ref 70–99)
Glucose-Capillary: 148 mg/dL — ABNORMAL HIGH (ref 70–99)

## 2022-03-02 SURGERY — REPAIR, HERNIA, UMBILICAL, ADULT
Anesthesia: General | Site: Abdomen

## 2022-03-02 MED ORDER — BUPIVACAINE-EPINEPHRINE 0.5% -1:200000 IJ SOLN
INTRAMUSCULAR | Status: DC | PRN
  Administered 2022-03-02: 20 mL

## 2022-03-02 MED ORDER — CHLORHEXIDINE GLUCONATE CLOTH 2 % EX PADS: 6.0000 | MEDICATED_PAD | Freq: Once | CUTANEOUS | Status: DC

## 2022-03-02 MED ORDER — LIDOCAINE HCL (CARDIAC) PF 100 MG/5ML IV SOSY
PREFILLED_SYRINGE | INTRAVENOUS | Status: DC | PRN
  Administered 2022-03-02: 60 mg via INTRAVENOUS

## 2022-03-02 MED ORDER — TRAMADOL HCL 50 MG PO TABS: 50.0000 mg | ORAL_TABLET | Freq: Four times a day (QID) | ORAL | 0 refills | Status: DC | PRN

## 2022-03-02 MED ORDER — ACETAMINOPHEN 500 MG PO TABS
1000.0000 mg | ORAL_TABLET | ORAL | Status: AC
  Administered 2022-03-02: 1000 mg via ORAL

## 2022-03-02 MED ORDER — EPHEDRINE SULFATE (PRESSORS) 50 MG/ML IJ SOLN
INTRAMUSCULAR | Status: DC | PRN
  Administered 2022-03-02: 10 mg via INTRAVENOUS

## 2022-03-02 MED ORDER — PHENYLEPHRINE 80 MCG/ML (10ML) SYRINGE FOR IV PUSH (FOR BLOOD PRESSURE SUPPORT)
PREFILLED_SYRINGE | INTRAVENOUS | Status: AC
  Filled 2022-03-02: qty 10

## 2022-03-02 MED ORDER — FENTANYL CITRATE (PF) 100 MCG/2ML IJ SOLN
INTRAMUSCULAR | Status: DC | PRN
  Administered 2022-03-02: 100 ug via INTRAVENOUS

## 2022-03-02 MED ORDER — SUCCINYLCHOLINE CHLORIDE 200 MG/10ML IV SOSY
PREFILLED_SYRINGE | INTRAVENOUS | Status: AC
  Filled 2022-03-02: qty 10

## 2022-03-02 MED ORDER — MIDAZOLAM HCL 5 MG/5ML IJ SOLN
INTRAMUSCULAR | Status: DC | PRN
  Administered 2022-03-02: 1 mg via INTRAVENOUS

## 2022-03-02 MED ORDER — AMISULPRIDE (ANTIEMETIC) 5 MG/2ML IV SOLN: 10.0000 mg | Freq: Once | INTRAVENOUS | Status: DC | PRN

## 2022-03-02 MED ORDER — ACETAMINOPHEN 500 MG PO TABS
ORAL_TABLET | ORAL | Status: AC
  Filled 2022-03-02: qty 2

## 2022-03-02 MED ORDER — EPHEDRINE 5 MG/ML INJ
INTRAVENOUS | Status: AC
  Filled 2022-03-02: qty 5

## 2022-03-02 MED ORDER — ACETAMINOPHEN 500 MG PO TABS: 1000.0000 mg | ORAL_TABLET | Freq: Once | ORAL | Status: DC

## 2022-03-02 MED ORDER — FENTANYL CITRATE (PF) 100 MCG/2ML IJ SOLN
INTRAMUSCULAR | Status: AC
  Filled 2022-03-02: qty 2

## 2022-03-02 MED ORDER — SCOPOLAMINE 1 MG/3DAYS TD PT72
1.0000 | MEDICATED_PATCH | TRANSDERMAL | Status: DC
  Administered 2022-03-02: 1.5 mg via TRANSDERMAL

## 2022-03-02 MED ORDER — LIDOCAINE 2% (20 MG/ML) 5 ML SYRINGE
INTRAMUSCULAR | Status: AC
  Filled 2022-03-02: qty 5

## 2022-03-02 MED ORDER — ATROPINE SULFATE 0.4 MG/ML IV SOLN
INTRAVENOUS | Status: AC
  Filled 2022-03-02: qty 1

## 2022-03-02 MED ORDER — ONDANSETRON HCL 4 MG/2ML IJ SOLN
INTRAMUSCULAR | Status: AC
  Filled 2022-03-02: qty 2

## 2022-03-02 MED ORDER — SCOPOLAMINE 1 MG/3DAYS TD PT72
MEDICATED_PATCH | TRANSDERMAL | Status: AC
  Filled 2022-03-02: qty 1

## 2022-03-02 MED ORDER — CEFAZOLIN SODIUM-DEXTROSE 2-4 GM/100ML-% IV SOLN
2.0000 g | INTRAVENOUS | Status: AC
  Administered 2022-03-02: 2 g via INTRAVENOUS

## 2022-03-02 MED ORDER — MIDAZOLAM HCL 2 MG/2ML IJ SOLN
INTRAMUSCULAR | Status: AC
  Filled 2022-03-02: qty 2

## 2022-03-02 MED ORDER — CEFAZOLIN SODIUM-DEXTROSE 2-4 GM/100ML-% IV SOLN
INTRAVENOUS | Status: AC
  Filled 2022-03-02: qty 100

## 2022-03-02 MED ORDER — ONDANSETRON HCL 4 MG/2ML IJ SOLN
INTRAMUSCULAR | Status: DC | PRN
  Administered 2022-03-02: 4 mg via INTRAVENOUS

## 2022-03-02 MED ORDER — PROPOFOL 10 MG/ML IV BOLUS
INTRAVENOUS | Status: DC | PRN
  Administered 2022-03-02: 150 mg via INTRAVENOUS

## 2022-03-02 MED ORDER — CELECOXIB 200 MG PO CAPS
200.0000 mg | ORAL_CAPSULE | Freq: Once | ORAL | Status: AC
  Administered 2022-03-02: 200 mg via ORAL

## 2022-03-02 MED ORDER — FENTANYL CITRATE (PF) 100 MCG/2ML IJ SOLN
25.0000 ug | INTRAMUSCULAR | Status: DC | PRN
  Administered 2022-03-02 (×2): 50 ug via INTRAVENOUS

## 2022-03-02 MED ORDER — DEXAMETHASONE SODIUM PHOSPHATE 10 MG/ML IJ SOLN
INTRAMUSCULAR | Status: AC
  Filled 2022-03-02: qty 1

## 2022-03-02 MED ORDER — DEXAMETHASONE SODIUM PHOSPHATE 4 MG/ML IJ SOLN
INTRAMUSCULAR | Status: DC | PRN
  Administered 2022-03-02: 4 mg via INTRAVENOUS

## 2022-03-02 MED ORDER — LACTATED RINGERS IV SOLN: INTRAVENOUS | Status: DC

## 2022-03-02 MED ORDER — CELECOXIB 200 MG PO CAPS
ORAL_CAPSULE | ORAL | Status: AC
  Filled 2022-03-02: qty 1

## 2022-03-02 MED ORDER — PROMETHAZINE HCL 25 MG/ML IJ SOLN: 6.2500 mg | INTRAMUSCULAR | Status: DC | PRN

## 2022-03-02 SURGICAL SUPPLY — 37 items
ADH SKN CLS APL DERMABOND .7 (GAUZE/BANDAGES/DRESSINGS) ×1
APL PRP STRL LF DISP 70% ISPRP (MISCELLANEOUS) ×1
BLADE SURG 15 STRL LF DISP TIS (BLADE) ×1 IMPLANT
BLADE SURG 15 STRL SS (BLADE) ×1
CHLORAPREP W/TINT 26 (MISCELLANEOUS) ×1 IMPLANT
COVER BACK TABLE 60X90IN (DRAPES) ×1 IMPLANT
COVER MAYO STAND STRL (DRAPES) ×1 IMPLANT
DERMABOND ADVANCED .7 DNX12 (GAUZE/BANDAGES/DRESSINGS) ×2 IMPLANT
DRAPE LAPAROTOMY 100X72 PEDS (DRAPES) ×1 IMPLANT
DRAPE UTILITY XL STRL (DRAPES) ×1 IMPLANT
ELECT REM PT RETURN 9FT ADLT (ELECTROSURGICAL) ×1
ELECTRODE REM PT RTRN 9FT ADLT (ELECTROSURGICAL) ×1 IMPLANT
GLOVE BIOGEL PI IND STRL 7.0 (GLOVE) IMPLANT
GLOVE SURG SIGNA 7.5 PF LTX (GLOVE) ×1 IMPLANT
GLOVE SURG SS PI 6.5 STRL IVOR (GLOVE) IMPLANT
GOWN STRL REUS W/ TWL LRG LVL3 (GOWN DISPOSABLE) ×1 IMPLANT
GOWN STRL REUS W/ TWL XL LVL3 (GOWN DISPOSABLE) ×1 IMPLANT
GOWN STRL REUS W/TWL LRG LVL3 (GOWN DISPOSABLE) ×1
GOWN STRL REUS W/TWL XL LVL3 (GOWN DISPOSABLE) ×1
MESH VENTRALEX ST 1-7/10 CRC S (Mesh General) IMPLANT
NDL HYPO 25X1 1.5 SAFETY (NEEDLE) ×1 IMPLANT
NEEDLE HYPO 25X1 1.5 SAFETY (NEEDLE) ×1 IMPLANT
NS IRRIG 1000ML POUR BTL (IV SOLUTION) IMPLANT
PACK BASIN DAY SURGERY FS (CUSTOM PROCEDURE TRAY) ×1 IMPLANT
PENCIL SMOKE EVACUATOR (MISCELLANEOUS) ×1 IMPLANT
SLEEVE SCD COMPRESS KNEE MED (STOCKING) ×1 IMPLANT
SPONGE T-LAP 4X18 ~~LOC~~+RFID (SPONGE) IMPLANT
SUT MNCRL AB 4-0 PS2 18 (SUTURE) ×1 IMPLANT
SUT NOVA 0 T19/GS 22DT (SUTURE) IMPLANT
SUT NOVA NAB DX-16 0-1 5-0 T12 (SUTURE) IMPLANT
SUT NOVA NAB GS-21 1 T12 (SUTURE) IMPLANT
SUT VIC AB 2-0 SH 27 (SUTURE)
SUT VIC AB 2-0 SH 27XBRD (SUTURE) IMPLANT
SUT VIC AB 3-0 SH 27 (SUTURE) ×1
SUT VIC AB 3-0 SH 27X BRD (SUTURE) ×1 IMPLANT
SYR CONTROL 10ML LL (SYRINGE) ×1 IMPLANT
TOWEL GREEN STERILE FF (TOWEL DISPOSABLE) ×1 IMPLANT

## 2022-03-02 NOTE — Anesthesia Postprocedure Evaluation (Signed)
Anesthesia Post Note  Patient: Tara Summers  Procedure(s) Performed: OPEN UMBILICAL HERNIA REPAIR WITH MESH (Abdomen)     Patient location during evaluation: PACU Anesthesia Type: General Level of consciousness: sedated Pain management: pain level controlled Vital Signs Assessment: post-procedure vital signs reviewed and stable Respiratory status: spontaneous breathing and respiratory function stable Cardiovascular status: stable Postop Assessment: no apparent nausea or vomiting Anesthetic complications: no  No notable events documented.  Last Vitals:  Vitals:   03/02/22 0930 03/02/22 1005  BP: (!) 130/53   Pulse: 62 68  Resp: 17 18  Temp:  (!) 36.4 C  SpO2: 94%     Last Pain:  Vitals:   03/02/22 1005  TempSrc: Oral  PainSc: 3                  Brentlee Sciara DANIEL

## 2022-03-02 NOTE — Discharge Instructions (Addendum)
[facility]  UMBILICAL OR INGUINAL HERNIA REPAIR: POST OP INSTRUCTIONS  Always review your discharge instruction sheet given to you by the facility where your surgery was performed. IF YOU HAVE DISABILITY OR FAMILY LEAVE FORMS, YOU MUST BRING THEM TO THE OFFICE FOR PROCESSING.   DO NOT GIVE THEM TO YOUR DOCTOR.  1. A  prescription for pain medication may be given to you upon discharge.  Take your pain medication as prescribed, if needed.  If narcotic pain medicine is not needed, then you may take acetaminophen (Tylenol) or ibuprofen (Advil) as needed. 2. Take your usually prescribed medications unless otherwise directed. If you need a refill on your pain medication, please contact your pharmacy.  They will contact our office to request authorization. Prescriptions will not be filled after 5 pm or on week-ends. 3. You should follow a light diet the first 24 hours after arrival home, such as soup and crackers, etc.  Be sure to include lots of fluids daily.  Resume your normal diet the day after surgery. 4.Most patients will experience some swelling and bruising around the umbilicus or in the groin and scrotum.  Ice packs and reclining will help.  Swelling and bruising can take several days to resolve.  6. It is common to experience some constipation if taking pain medication after surgery.  Increasing fluid intake and taking a stool softener (such as Colace) will usually help or prevent this problem from occurring.  A mild laxative (Milk of Magnesia or Miralax) should be taken according to package directions if there are no bowel movements after 48 hours. 7. Unless discharge instructions indicate otherwise, you may remove your bandages 24-48 hours after surgery, and you may shower at that time.  You may have steri-strips (small skin tapes) in place directly over the incision.  These strips should be left on the skin for 7-10 days.  If your surgeon used skin glue on the  incision, you may shower in 24 hours.  The glue will flake off over the next 2-3 weeks.  Any sutures or staples will be removed at the office during your follow-up visit. 8. ACTIVITIES:  You may resume regular (light) daily activities beginning the next day--such as daily self-care, walking, climbing stairs--gradually increasing activities as tolerated.  You may have sexual intercourse when it is comfortable.  Refrain from any heavy lifting or straining until approved by your doctor.  a.You may drive when you are no longer taking prescription pain medication, you can comfortably wear a seatbelt, and you can safely maneuver your car and apply brakes. b.RETURN TO WORK:   _____________________________________________  9.You should see your doctor in the office for a follow-up appointment approximately 2-3 weeks after your surgery.  Make sure that you call for this appointment within a day or two after you arrive home to insure a convenient appointment time. 10.OTHER INSTRUCTIONS: YOU MAY SHOWER STARTING TOMORROW ICE PACK, TYLENOL, AND IBUPROFEN ALSO FOR PAIN NO LIFTING MORE THAN 15 POUNDS FOR 4 WEEKS_________________________    _____________________________________  WHEN TO CALL YOUR DOCTOR: Fever over 101.0 Inability to urinate Nausea and/or vomiting Extreme swelling or bruising Continued bleeding from incision. Increased pain, redness, or drainage from the incision  The clinic staff is available to answer your questions during regular business hours.  Please don't hesitate to call and ask to speak to one of the nurses for clinical concerns.  If you have a medical emergency, go to the nearest emergency room or call 911.  A surgeon from [facility]: [facility] is always on call at the hospital   [address] [place], [place], [place]  [address] ?  [address], [place], [place]   [address] [phone] ? [phone] ? FAX [phone] Web site:  [email or web address]    Post Anesthesia Home Care Instructions  Activity: Get plenty of rest for the remainder of the day. A responsible individual must stay with you for 24 hours following the procedure.  For the next 24 hours, DO NOT: -Drive a car -Paediatric nurse -Drink alcoholic beverages -Take any medication unless instructed by your physician -Make any legal decisions or sign important papers.  Meals: Start with liquid foods such as gelatin or soup. Progress to regular foods as tolerated. Avoid greasy, spicy, heavy foods. If nausea and/or vomiting occur, drink only clear liquids until the nausea and/or vomiting subsides. Call your physician if vomiting continues.  Special Instructions/Symptoms: Your throat may feel dry or sore from the anesthesia or the breathing tube placed in your throat during surgery. If this causes discomfort, gargle with warm salt water. The discomfort should disappear within 24 hours.  If you had a scopolamine patch placed behind your ear for the management of post- operative nausea and/or vomiting:  1. The medication in the patch is effective for 72 hours, after which it should be removed.  Wrap patch in a tissue and discard in the trash. Wash hands thoroughly with soap and water. 2. You may remove the patch earlier than 72 hours if you experience unpleasant side effects which may include dry mouth, dizziness or visual disturbances. 3. Avoid touching the patch. Wash your hands with soap and water after contact with the patch.     Tylenol after 1:00 pm, Ibuprofen after 3:00pm

## 2022-03-02 NOTE — Op Note (Signed)
OPEN UMBILICAL HERNIA REPAIR WITH MESH  Procedure Note  Tara Summers 03/02/2022   Pre-op Diagnosis: UMBILICAL HERNIA     Post-op Diagnosis: same  Procedure(s): OPEN UMBILICAL HERNIA REPAIR WITH MESH 1.5 cm FASCIAL DEFECT  Surgeon(s): Coralie Keens, MD  Anesthesia: General  Staff:  Circulator: Izora Ribas, RN Scrub Person: Malachy Mood E, NT  Estimated Blood Loss: Minimal               Findings: The patient was found to have a hernia just slightly above the umbilicus with a 1.5 cm fascial defect.  It contained incarcerated preperitoneal fat and omentum.  The hernia was repaired with a 4.3 cm round ventral Prolene patch from [facility]  Procedure: The patient was brought to the operating room and identifies correct patient.  She was placed supine on the operating room table and general anesthesia was induced.  Her abdomen was prepped and draped in the usual sterile fashion.  I anesthetized the skin above the umbilicus with Marcaine and then made a transverse incision with a scalpel.  I then dissected down into the subcutaneous tissue with electrocautery.  Identified the hernia sac just above the umbilicus and separated from the overlying umbilical skin with the cautery.  I then opened the sac and found incarcerated preperitoneal fat and some omentum.  I excised this with the cautery and reduced the rest of the contents back into the peritoneal cavity.  The fascial defect was approximate 1.5 cm.  A 4.3 cm round ventral Prolene patch from [facility] was brought to the field.  I placed it through the fascial opening and pulled it up against the peritoneum with the ties.  The mesh was sutured in place with 0 silk suture circumferentially.  I then cut the stay ties and closed the fascia over the top of the mesh with figure-of-eight #1 Novafil sutures.  I anesthetized the fascia further with Marcaine.  Hemostasis appeared to be achieved.  I then closed the subcutaneous tissue with interrupted  3-0 Vicryl sutures and closed the skin with a running 4-0 Monocryl.  Dermabond was then applied.  The patient tolerated the procedure well.  All the counts were correct at the end of the procedure.  She was then extubated in the operating room and taken in a stable condition to the recovery room.          Coralie Keens   Date: 03/02/2022  Time: 8:35 AM

## 2022-03-02 NOTE — Anesthesia Procedure Notes (Signed)
Procedure Name: LMA Insertion Date/Time: 03/02/2022 8:07 AM  Performed by: Willa Frater, CRNAPre-anesthesia Checklist: Patient identified, Emergency Drugs available, Suction available and Patient being monitored Patient Re-evaluated:Patient Re-evaluated prior to induction Oxygen Delivery Method: Circle system utilized Preoxygenation: Pre-oxygenation with 100% oxygen Induction Type: IV induction Ventilation: Mask ventilation without difficulty LMA: LMA inserted LMA Size: 4.0 Number of attempts: 1 Airway Equipment and Method: Bite block Placement Confirmation: positive ETCO2 Tube secured with: Tape Dental Injury: Teeth and Oropharynx as per pre-operative assessment

## 2022-03-02 NOTE — Interval H&P Note (Signed)
History and Physical Interval Note:no change in H and P  03/02/2022 7:02 AM  Tara Summers  has presented today for surgery, with the diagnosis of UMBILICAL HERNIA.  The various methods of treatment have been discussed with the patient and family. After consideration of risks, benefits and other options for treatment, the patient has consented to  Procedure(s) with comments: OPEN UMBILICAL HERNIA REPAIR WITH MESH (N/A) - 60 MIN - ROOM 8 as a surgical intervention.  The patient's history has been reviewed, patient examined, no change in status, stable for surgery.  I have reviewed the patient's chart and labs.  Questions were answered to the patient's satisfaction.     Coralie Keens

## 2022-03-02 NOTE — Transfer of Care (Signed)
Immediate Anesthesia Transfer of Care Note  Patient: Tara Summers  Procedure(s) Performed: OPEN UMBILICAL HERNIA REPAIR WITH MESH (Abdomen)  Patient Location: PACU  Anesthesia Type:General  Level of Consciousness: sedated  Airway & Oxygen Therapy: Patient Spontanous Breathing and Patient connected to face mask oxygen  Post-op Assessment: Report given to RN and Post -op Vital signs reviewed and stable  Post vital signs: Reviewed and stable  Last Vitals:  Vitals Value Taken Time  BP 121/60 03/02/22 0842  Temp 36.4 C 03/02/22 0842  Pulse 61 03/02/22 0842  Resp 17 03/02/22 0842  SpO2 99 % 03/02/22 0842  Vitals shown include unvalidated device data.  Last Pain:  Vitals:   03/02/22 0653  TempSrc: Oral  PainSc: 5          Complications: No notable events documented.

## 2022-03-02 NOTE — Addendum Note (Signed)
Addendum  created 03/02/22 1333 by Willa Frater, CRNA   Child order released for a procedure order, Clinical Note Signed, Intraprocedure Blocks edited, LDA created via procedure documentation, SmartForm saved

## 2022-03-03 ENCOUNTER — Encounter (HOSPITAL_BASED_OUTPATIENT_CLINIC_OR_DEPARTMENT_OTHER): Payer: Self-pay | Admitting: Surgery

## 2022-06-02 ENCOUNTER — Other Ambulatory Visit: Payer: Self-pay

## 2022-06-02 ENCOUNTER — Encounter (HOSPITAL_BASED_OUTPATIENT_CLINIC_OR_DEPARTMENT_OTHER): Payer: Self-pay

## 2022-06-02 ENCOUNTER — Emergency Department (HOSPITAL_BASED_OUTPATIENT_CLINIC_OR_DEPARTMENT_OTHER)
Admission: EM | Admit: 2022-06-02 | Discharge: 2022-06-02 | Disposition: A | Discharge status: Home / Self Care | Payer: Medicare PPO | Attending: Emergency Medicine | Admitting: Emergency Medicine

## 2022-06-02 ENCOUNTER — Emergency Department (HOSPITAL_BASED_OUTPATIENT_CLINIC_OR_DEPARTMENT_OTHER): Payer: Medicare PPO

## 2022-06-02 DIAGNOSIS — Z7982 Long term (current) use of aspirin: Secondary | ICD-10-CM | POA: Diagnosis not present

## 2022-06-02 DIAGNOSIS — M5442 Lumbago with sciatica, left side: Secondary | ICD-10-CM | POA: Diagnosis not present

## 2022-06-02 DIAGNOSIS — Z7901 Long term (current) use of anticoagulants: Secondary | ICD-10-CM | POA: Insufficient documentation

## 2022-06-02 DIAGNOSIS — M545 Low back pain, unspecified: Secondary | ICD-10-CM | POA: Diagnosis present

## 2022-06-02 MED ORDER — METHYLPREDNISOLONE 4 MG PO TBPK: ORAL_TABLET | ORAL | 0 refills | Status: DC

## 2022-06-02 MED ORDER — OXYCODONE-ACETAMINOPHEN 5-325 MG PO TABS
2.0000 | ORAL_TABLET | Freq: Once | ORAL | Status: AC
  Administered 2022-06-02: 2 via ORAL
  Filled 2022-06-02: qty 2

## 2022-06-02 MED ORDER — PREDNISONE 20 MG PO TABS
20.0000 mg | ORAL_TABLET | Freq: Once | ORAL | Status: AC
  Administered 2022-06-02: 20 mg via ORAL
  Filled 2022-06-02: qty 1

## 2022-06-02 MED ORDER — CYCLOBENZAPRINE HCL 5 MG PO TABS
5.0000 mg | ORAL_TABLET | Freq: Once | ORAL | Status: AC
  Administered 2022-06-02: 5 mg via ORAL
  Filled 2022-06-02: qty 1

## 2022-06-02 MED ORDER — CYCLOBENZAPRINE HCL 10 MG PO TABS: 10.0000 mg | ORAL_TABLET | Freq: Two times a day (BID) | ORAL | 0 refills | Status: DC | PRN

## 2022-06-02 NOTE — Discharge Instructions (Signed)
Please take medications as prescribed You may also use acetaminophen 650 mg every 4 hours Recheck with your doctor next week Return if you are having worsening symptoms especially increased sensation in the saddle area, weakness, or loss of bowel or bladder control

## 2022-06-02 NOTE — ED Provider Notes (Signed)
[facility] Provider Note   CSN: [id number] Arrival date & time: 06/02/22  1107     History  Chief Complaint  Patient presents with   Back Pain    Tara Summers is a 72 y.o. female.  HPI 72 year old lady complaining of low back pain that began several days ago.  Pain is worse with movement.  It radiates into the left buttock.  She denies any numbness, tingling, weakness, increased frequency of urination or lack control of her urine or bladder.  She denies any recent injuries.  She is not on blood thinners.  He has not had any recent instrumentation or IV drug use     Home Medications Prior to Admission medications   Medication Sig Start Date End Date Taking? Authorizing Provider  cyclobenzaprine (FLEXERIL) 10 MG tablet Take 1 tablet (10 mg total) by mouth 2 (two) times daily as needed for muscle spasms. 06/02/22  Yes Pattricia Boss, MD  methylPREDNISolone (MEDROL DOSEPAK) 4 MG TBPK tablet Take as per package instructions 06/02/22  Yes Pattricia Boss, MD  albuterol (VENTOLIN HFA) 108 (90 Base) MCG/ACT inhaler Inhale 1 puff into the lungs every 6 (six) hours as needed for wheezing or shortness of breath. 11/06/19   [provider]  amLODipine (NORVASC) 10 MG tablet Take 10 mg by mouth daily.    [provider]  aspirin EC 81 MG tablet Take 81 mg by mouth 2 (two) times a week.     [provider]  atorvastatin (LIPITOR) 40 MG tablet Take 1 tablet (40 mg total) by mouth daily. 07/23/19   Dixie Dials, MD  clopidogrel (PLAVIX) 75 MG tablet Take 1 tablet (75 mg total) by mouth daily. Patient taking differently: Take 75 mg by mouth daily. Has not taken in > 3 months 07/22/19   Dixie Dials, MD  CONTOUR TEST test strip EVERY DAY 02/08/18   Renato Shin, MD  hydrALAZINE (APRESOLINE) 25 MG tablet Take 1 tablet (25 mg total) by mouth 2 (two) times daily. 07/22/19   Dixie Dials, MD  losartan (COZAAR) 100 MG tablet Take 1 tablet  (100 mg total) by mouth daily. 07/23/19   Dixie Dials, MD  metFORMIN (GLUCOPHAGE) 500 MG tablet Take 500 mg by mouth 2 (two) times daily with a meal.    [provider]  metoprolol succinate (TOPROL-XL) 25 MG 24 hr tablet Take 25 mg by mouth daily. 02/15/18   [provider]  polyethylene glycol (MIRALAX / GLYCOLAX) packet Take 17 g by mouth daily as needed for moderate constipation.     [provider]  potassium chloride 20 MEQ TBCR Take 20 mEq by mouth 2 (two) times daily. 07/22/19   Dixie Dials, MD  torsemide (DEMADEX) 20 MG tablet Take 1 tablet (20 mg total) by mouth 2 (two) times daily. 07/22/19   Dixie Dials, MD  traMADol (ULTRAM) 50 MG tablet Take 1 tablet (50 mg total) by mouth every 6 (six) hours as needed for moderate pain or severe pain. 03/02/22   Coralie Keens, MD  cloNIDine (CATAPRES) 0.1 MG tablet Take 0.1 mg by mouth 2 (two) times daily.    08/04/19  [provider]      Allergies    Patient has no known allergies.    Review of Systems   Review of Systems  Physical Exam Updated Vital Signs BP (!) 188/72 (BP Location: Right Arm) Comment: Pt has not taken HTN medication  Pulse 76   Temp 98.2  F (36.8 C) (Oral)   Resp 20   Ht 1.524 m (5')   Wt 104.3 kg   SpO2 98%   BMI 44.92 kg/m  Physical Exam Vitals and nursing note reviewed.  Constitutional:      Appearance: Normal appearance.  HENT:     Head: Normocephalic.     Right Ear: External ear normal.     Left Ear: External ear normal.     Nose: Nose normal.     Mouth/Throat:     Pharynx: Oropharynx is clear.  Eyes:     Pupils: Pupils are equal, round, and reactive to light.  Cardiovascular:     Rate and Rhythm: Normal rate and regular rhythm.     Pulses: Normal pulses.  Pulmonary:     Effort: Pulmonary effort is normal.  Abdominal:     General: Bowel sounds are normal. There is no distension.     Palpations: Abdomen is soft.     Tenderness: There is no abdominal  tenderness.  Musculoskeletal:        General: Normal range of motion.     Cervical back: Normal range of motion.     Comments: Low back visually examined and no obvious external signs of trauma, redness, or swelling No point tenderness Patient points to the lower lumbar spine to the buttock is where the pain is most  Skin:    General: Skin is dry.  Neurological:     General: No focal deficit present.     Mental Status: She is alert.     Cranial Nerves: No cranial nerve deficit.     Motor: No weakness.     Coordination: Coordination normal.     Deep Tendon Reflexes: Reflexes normal.  Psychiatric:        Mood and Affect: Mood normal.     ED Results / Procedures / Treatments   Labs (all labs ordered are listed, but only abnormal results are displayed) Labs Reviewed - No data to display  EKG None  Radiology CT Lumbar Spine Wo Contrast  Result Date: 06/02/2022 CLINICAL DATA:  Low back pain, increased fracture risk EXAM: CT LUMBAR SPINE WITHOUT CONTRAST TECHNIQUE: Multidetector CT imaging of the lumbar spine was performed without intravenous contrast administration. Multiplanar CT image reconstructions were also generated. RADIATION DOSE REDUCTION: This exam was performed according to the departmental dose-optimization program which includes automated exposure control, adjustment of the mA and/or kV according to patient size and/or use of iterative reconstruction technique. COMPARISON:  CT 04/21/2021. FINDINGS: Segmentation: 5 lumbar type vertebrae. Alignment: Trace degenerative anterolisthesis at L3-L4. Residual grade 1 anterolisthesis at L4-L5. Alignment is unchanged. Vertebrae: No evidence of acute lumbar spine fracture. Prior posterior decompression with anterior and posterior fusion at L4-L5. Solid intervertebral arthrodesis. Hardware is intact without evidence of loosening. Paraspinal and other soft tissues: Aortoiliac atherosclerosis. No other significant findings. Disc levels:  T12-L1: No significant spinal canal or neural foraminal narrowing. L1-L2: No significant spinal canal or neural foraminal narrowing. L2-L3: Mild disc bulging and bilateral facet arthropathy. No significant spinal canal stenosis. Moderate left and mild right neural foraminal stenosis. L3-L4: Trace degenerate anterolisthesis with mild disc bulging and advanced bilateral facet arthropathy. Mild spinal canal stenosis. Moderate left and moderate-severe right neural foraminal stenosis. L4-L5: Prior posterior decompression and fusion with residual grade 1 anterolisthesis. Patent spinal canal. There is foraminal bony hypertrophy and spurring but there is no evidence of significant neural foraminal stenosis. L5-S1: Minimal disc bulging. Moderate right and mild left facet arthropathy. No  significant spinal canal or neural foraminal stenosis. IMPRESSION: No acute lumbar spine fracture. Prior posterior decompression with anterior and posterior fusion at L4-L5. No evidence of hardware complication. Multilevel degenerative changes as described above. Mild spinal canal stenosis at L3-L4. Moderate left and moderate-severe right neural foraminal stenosis at L3-L4. Moderate left and mild right neural foraminal stenosis at L2-L3. Electronically Signed   By: Maurine Simmering M.D.   On: 06/02/2022 12:27    Procedures Procedures    Medications Ordered in ED Medications  oxyCODONE-acetaminophen (PERCOCET/ROXICET) 5-325 MG per tablet 2 tablet (2 tablets Oral Given 06/02/22 1240)  predniSONE (DELTASONE) tablet 20 mg (20 mg Oral Given 06/02/22 1240)  cyclobenzaprine (FLEXERIL) tablet 5 mg (5 mg Oral Given 06/02/22 1240)    ED Course/ Medical Decision Making/ A&P                             Medical Decision Making 72 year old female with low back pain.  Differential diagnosis includes but is not limited to Trauma, fracture Infection Degenerative disease Autoimmune disorder Vascular etiology Patient with good pulses Patient  evaluated with CT scan no evidence of fracture there is significant DJD with some facet arthropathy and foraminal no narrowing noted Patient treated here in ED with Flexeril Percocet and prednisone she has some improvement. We have discussed need for follow-up and return precautions including red flags such as perineal anesthesia, loss of bowel or bladder control, weakness and she voices understanding. She will be given referral to neurosurgery She will follow-up with primary care  Amount and/or Complexity of Data Reviewed Radiology: ordered and independent interpretation performed. Decision-making details documented in ED Course.  Risk Prescription drug management.           Final Clinical Impression(s) / ED Diagnoses Final diagnoses:  Acute midline low back pain with left-sided sciatica    Rx / DC Orders ED Discharge Orders          Ordered    cyclobenzaprine (FLEXERIL) 10 MG tablet  2 times daily PRN        06/02/22 1302    methylPREDNISolone (MEDROL DOSEPAK) 4 MG TBPK tablet        06/02/22 1302              Pattricia Boss, MD 06/02/22 1302

## 2022-06-02 NOTE — ED Notes (Signed)
Pt given discharge instructions. Opportunities given for questions. Pt verbalizes understanding. Nicholous Girgenti R, RN 

## 2022-06-02 NOTE — ED Triage Notes (Signed)
Pt c/o bilateral low back pain x3 days.  Pain score 9/10.  Pt has not taken anything for pain.  Denies injury, GU, and GI complaints.

## 2022-09-18 ENCOUNTER — Ambulatory Visit
Admission: RE | Admit: 2022-09-18 | Discharge: 2022-09-18 | Disposition: A | Discharge status: Home / Self Care | Payer: Medicare PPO | Source: Ambulatory Visit | Attending: Cardiovascular Disease | Admitting: Cardiovascular Disease

## 2022-09-18 ENCOUNTER — Other Ambulatory Visit: Payer: Self-pay | Admitting: Cardiovascular Disease

## 2022-09-18 DIAGNOSIS — R200 Anesthesia of skin: Secondary | ICD-10-CM

## 2022-12-04 ENCOUNTER — Ambulatory Visit (INDEPENDENT_AMBULATORY_CARE_PROVIDER_SITE_OTHER): Payer: Medicare PPO | Admitting: Podiatry

## 2022-12-04 DIAGNOSIS — Z91199 Patient's noncompliance with other medical treatment and regimen due to unspecified reason: Secondary | ICD-10-CM

## 2022-12-04 NOTE — Progress Notes (Signed)
No show

## 2023-03-13 ENCOUNTER — Other Ambulatory Visit: Payer: Self-pay | Admitting: Nurse Practitioner

## 2023-03-13 DIAGNOSIS — R109 Unspecified abdominal pain: Secondary | ICD-10-CM

## 2023-03-19 ENCOUNTER — Encounter: Payer: Self-pay | Admitting: Nurse Practitioner

## 2023-03-21 ENCOUNTER — Ambulatory Visit
Admission: RE | Admit: 2023-03-21 | Discharge: 2023-03-21 | Disposition: A | Discharge status: Home / Self Care | Payer: Medicare PPO | Source: Ambulatory Visit | Attending: Nurse Practitioner | Admitting: Nurse Practitioner

## 2023-03-21 DIAGNOSIS — R109 Unspecified abdominal pain: Secondary | ICD-10-CM

## 2023-04-18 ENCOUNTER — Encounter (HOSPITAL_COMMUNITY): Payer: Self-pay

## 2023-04-18 ENCOUNTER — Emergency Department (HOSPITAL_COMMUNITY): Payer: Medicare PPO

## 2023-04-18 ENCOUNTER — Observation Stay (HOSPITAL_COMMUNITY)
Admission: EM | Admit: 2023-04-18 | Discharge: 2023-04-20 | Disposition: A | Discharge status: Home / Self Care | Payer: Medicare PPO | Attending: Family Medicine | Admitting: Family Medicine

## 2023-04-18 ENCOUNTER — Other Ambulatory Visit: Payer: Self-pay

## 2023-04-18 DIAGNOSIS — E119 Type 2 diabetes mellitus without complications: Secondary | ICD-10-CM | POA: Diagnosis not present

## 2023-04-18 DIAGNOSIS — Z7902 Long term (current) use of antithrombotics/antiplatelets: Secondary | ICD-10-CM | POA: Diagnosis not present

## 2023-04-18 DIAGNOSIS — D509 Iron deficiency anemia, unspecified: Secondary | ICD-10-CM | POA: Diagnosis not present

## 2023-04-18 DIAGNOSIS — Z7982 Long term (current) use of aspirin: Secondary | ICD-10-CM | POA: Insufficient documentation

## 2023-04-18 DIAGNOSIS — Z79899 Other long term (current) drug therapy: Secondary | ICD-10-CM | POA: Diagnosis not present

## 2023-04-18 DIAGNOSIS — I5032 Chronic diastolic (congestive) heart failure: Secondary | ICD-10-CM

## 2023-04-18 DIAGNOSIS — Z7984 Long term (current) use of oral hypoglycemic drugs: Secondary | ICD-10-CM | POA: Insufficient documentation

## 2023-04-18 DIAGNOSIS — R479 Unspecified speech disturbances: Secondary | ICD-10-CM

## 2023-04-18 DIAGNOSIS — R079 Chest pain, unspecified: Principal | ICD-10-CM | POA: Diagnosis present

## 2023-04-18 DIAGNOSIS — I10 Essential (primary) hypertension: Secondary | ICD-10-CM | POA: Insufficient documentation

## 2023-04-18 DIAGNOSIS — R200 Anesthesia of skin: Secondary | ICD-10-CM

## 2023-04-18 DIAGNOSIS — Z794 Long term (current) use of insulin: Secondary | ICD-10-CM | POA: Insufficient documentation

## 2023-04-18 DIAGNOSIS — Z87891 Personal history of nicotine dependence: Secondary | ICD-10-CM | POA: Diagnosis not present

## 2023-04-18 DIAGNOSIS — Z853 Personal history of malignant neoplasm of breast: Secondary | ICD-10-CM | POA: Insufficient documentation

## 2023-04-18 DIAGNOSIS — I2510 Atherosclerotic heart disease of native coronary artery without angina pectoris: Secondary | ICD-10-CM

## 2023-04-18 DIAGNOSIS — I110 Hypertensive heart disease with heart failure: Secondary | ICD-10-CM | POA: Insufficient documentation

## 2023-04-18 LAB — CBC
HCT: 38 % (ref 36.0–46.0)
Hemoglobin: 11.8 g/dL — ABNORMAL LOW (ref 12.0–15.0)
MCH: 27.6 pg (ref 26.0–34.0)
MCHC: 31.1 g/dL (ref 30.0–36.0)
MCV: 88.8 fL (ref 80.0–100.0)
Platelets: 201 10*3/uL (ref 150–400)
RBC: 4.28 MIL/uL (ref 3.87–5.11)
RDW: 13.1 % (ref 11.5–15.5)
WBC: 5 10*3/uL (ref 4.0–10.5)
nRBC: 0 % (ref 0.0–0.2)

## 2023-04-18 LAB — TROPONIN I (HIGH SENSITIVITY)
Troponin I (High Sensitivity): 8 ng/L (ref <18)
Troponin I (High Sensitivity): 14 ng/L (ref <18)

## 2023-04-18 LAB — BASIC METABOLIC PANEL
Anion gap: 9 (ref 5–15)
BUN: 20 mg/dL (ref 8–23)
CO2: 26 mmol/L (ref 22–32)
Calcium: 9.2 mg/dL (ref 8.9–10.3)
Chloride: 108 mmol/L (ref 98–111)
Creatinine, Ser: 0.97 mg/dL (ref 0.44–1.00)
GFR, Estimated: >60 mL/min (ref >60)
Glucose, Bld: 133 mg/dL — ABNORMAL HIGH (ref 70–99)
Potassium: 3.6 mmol/L (ref 3.5–5.1)
Sodium: 143 mmol/L (ref 135–145)

## 2023-04-18 NOTE — ED Triage Notes (Addendum)
Pt came to ED for chest pain started 1 hr ago with left arm pain. Denies back pain, n/v. Axox4. Pt took 2 aspirins.

## 2023-04-19 ENCOUNTER — Observation Stay (HOSPITAL_COMMUNITY): Payer: Medicare PPO

## 2023-04-19 ENCOUNTER — Emergency Department (HOSPITAL_COMMUNITY): Payer: Medicare PPO

## 2023-04-19 DIAGNOSIS — R609 Edema, unspecified: Secondary | ICD-10-CM

## 2023-04-19 DIAGNOSIS — R202 Paresthesia of skin: Secondary | ICD-10-CM | POA: Diagnosis not present

## 2023-04-19 DIAGNOSIS — R079 Chest pain, unspecified: Secondary | ICD-10-CM

## 2023-04-19 DIAGNOSIS — I5032 Chronic diastolic (congestive) heart failure: Secondary | ICD-10-CM

## 2023-04-19 DIAGNOSIS — R479 Unspecified speech disturbances: Secondary | ICD-10-CM

## 2023-04-19 DIAGNOSIS — I2510 Atherosclerotic heart disease of native coronary artery without angina pectoris: Secondary | ICD-10-CM

## 2023-04-19 LAB — D-DIMER, QUANTITATIVE: D-Dimer, Quant: 1.11 ug{FEU}/mL — ABNORMAL HIGH (ref 0.00–0.50)

## 2023-04-19 LAB — CBC WITH DIFFERENTIAL/PLATELET
Abs Immature Granulocytes: 0.02 10*3/uL (ref 0.00–0.07)
Basophils Absolute: 0 10*3/uL (ref 0.0–0.1)
Basophils Relative: 0 %
Eosinophils Absolute: 0.2 10*3/uL (ref 0.0–0.5)
Eosinophils Relative: 3 %
HCT: 40.3 % (ref 36.0–46.0)
Hemoglobin: 12.9 g/dL (ref 12.0–15.0)
Immature Granulocytes: 0 %
Lymphocytes Relative: 28 %
Lymphs Abs: 1.4 10*3/uL (ref 0.7–4.0)
MCH: 27.7 pg (ref 26.0–34.0)
MCHC: 32 g/dL (ref 30.0–36.0)
MCV: 86.7 fL (ref 80.0–100.0)
Monocytes Absolute: 0.3 10*3/uL (ref 0.1–1.0)
Monocytes Relative: 5 %
Neutro Abs: 3.2 10*3/uL (ref 1.7–7.7)
Neutrophils Relative %: 64 %
Platelets: 168 10*3/uL (ref 150–400)
RBC: 4.65 MIL/uL (ref 3.87–5.11)
RDW: 13.3 % (ref 11.5–15.5)
WBC: 5.1 10*3/uL (ref 4.0–10.5)
nRBC: 0 % (ref 0.0–0.2)

## 2023-04-19 LAB — HEMOGLOBIN A1C
Hgb A1c MFr Bld: 4.8 % (ref 4.8–5.6)
Mean Plasma Glucose: 91.06 mg/dL

## 2023-04-19 LAB — ECHOCARDIOGRAM COMPLETE
Area-P 1/2: 3.93 cm2
Calc EF: 62.5 %
Height: 64 in
S' Lateral: 2.2 cm
Single Plane A2C EF: 74 %
Single Plane A4C EF: 50.8 %
Weight: 3600 [oz_av]

## 2023-04-19 LAB — CBG MONITORING, ED
Glucose-Capillary: 88 mg/dL (ref 70–99)
Glucose-Capillary: 87 mg/dL (ref 70–99)

## 2023-04-19 LAB — COMPREHENSIVE METABOLIC PANEL
ALT: 16 U/L (ref 0–44)
AST: 23 U/L (ref 15–41)
Albumin: 3.5 g/dL (ref 3.5–5.0)
Alkaline Phosphatase: 76 U/L (ref 38–126)
Anion gap: 9 (ref 5–15)
BUN: 15 mg/dL (ref 8–23)
CO2: 28 mmol/L (ref 22–32)
Calcium: 9.3 mg/dL (ref 8.9–10.3)
Chloride: 103 mmol/L (ref 98–111)
Creatinine, Ser: 1.2 mg/dL — ABNORMAL HIGH (ref 0.44–1.00)
GFR, Estimated: 49 mL/min — ABNORMAL LOW (ref >60)
Glucose, Bld: 123 mg/dL — ABNORMAL HIGH (ref 70–99)
Potassium: 3.9 mmol/L (ref 3.5–5.1)
Sodium: 140 mmol/L (ref 135–145)
Total Bilirubin: 1.3 mg/dL — ABNORMAL HIGH (ref <1.2)
Total Protein: 7.3 g/dL (ref 6.5–8.1)

## 2023-04-19 LAB — HIV ANTIBODY (ROUTINE TESTING W REFLEX): HIV Screen 4th Generation wRfx: NONREACTIVE

## 2023-04-19 LAB — GLUCOSE, CAPILLARY
Glucose-Capillary: 89 mg/dL (ref 70–99)
Glucose-Capillary: 111 mg/dL — ABNORMAL HIGH (ref 70–99)

## 2023-04-19 LAB — BRAIN NATRIURETIC PEPTIDE: B Natriuretic Peptide: 25 pg/mL (ref 0.0–100.0)

## 2023-04-19 MED ORDER — IOHEXOL 350 MG/ML SOLN
75.0000 mL | Freq: Once | INTRAVENOUS | Status: AC | PRN
  Administered 2023-04-19: 75 mL via INTRAVENOUS

## 2023-04-19 MED ORDER — ATORVASTATIN CALCIUM 40 MG PO TABS
40.0000 mg | ORAL_TABLET | Freq: Every day | ORAL | Status: DC
  Administered 2023-04-19 – 2023-04-20 (×2): 40 mg via ORAL
  Filled 2023-04-19 (×2): qty 1

## 2023-04-19 MED ORDER — HYDRALAZINE HCL 20 MG/ML IJ SOLN
5.0000 mg | INTRAMUSCULAR | Status: DC | PRN
  Administered 2023-04-19: 5 mg via INTRAVENOUS
  Filled 2023-04-19 (×2): qty 1

## 2023-04-19 MED ORDER — INSULIN ASPART 100 UNIT/ML IJ SOLN
0.0000 [IU] | Freq: Every day | INTRAMUSCULAR | Status: DC
Start: 2023-04-19 — End: 2023-04-20

## 2023-04-19 MED ORDER — ENOXAPARIN SODIUM 40 MG/0.4ML IJ SOSY
40.0000 mg | PREFILLED_SYRINGE | INTRAMUSCULAR | Status: DC
  Administered 2023-04-19 – 2023-04-20 (×2): 40 mg via SUBCUTANEOUS
  Filled 2023-04-19 (×2): qty 0.4

## 2023-04-19 MED ORDER — INSULIN ASPART 100 UNIT/ML IJ SOLN: 0.0000 [IU] | Freq: Three times a day (TID) | INTRAMUSCULAR | Status: DC

## 2023-04-19 MED ORDER — ASPIRIN 81 MG PO TBEC
81.0000 mg | DELAYED_RELEASE_TABLET | ORAL | Status: DC
Start: 2023-04-25 — End: 2023-04-20

## 2023-04-19 MED ORDER — ACETAMINOPHEN 650 MG RE SUPP: 650.0000 mg | Freq: Four times a day (QID) | RECTAL | Status: DC | PRN

## 2023-04-19 MED ORDER — FUROSEMIDE 10 MG/ML IJ SOLN
40.0000 mg | Freq: Once | INTRAMUSCULAR | Status: AC
  Administered 2023-04-19: 40 mg via INTRAVENOUS
  Filled 2023-04-19: qty 4

## 2023-04-19 MED ORDER — ACETAMINOPHEN 325 MG PO TABS: 650.0000 mg | ORAL_TABLET | Freq: Four times a day (QID) | ORAL | Status: DC | PRN

## 2023-04-19 MED ORDER — CLOPIDOGREL BISULFATE 75 MG PO TABS
75.0000 mg | ORAL_TABLET | Freq: Every day | ORAL | Status: DC
  Administered 2023-04-19 – 2023-04-20 (×2): 75 mg via ORAL
  Filled 2023-04-19 (×2): qty 1

## 2023-04-19 NOTE — Progress Notes (Addendum)
PROGRESS NOTE    Tara Summers  XBM:[id number] DOB: Feb 23, 1951 DOA: 04/18/2023 PCP: Leilani Able, MD  Chief Complaint  Patient presents with   Chest Pain    Brief Narrative:    Tara Summers is Tara Summers 72 y.o. female with medical history significant of chronic HFpEF, hypertrophic nonobstructive cardiomyopathy, CAD, hypertension, type 2 diabetes, obesity, chronic bilateral lower extremity edema/lymphedema, history of breast cancer, GERD, arthritis, sleep apnea presented to the ED with complaints of chest pain and slurred speech.   Assessment & Plan:   Principal Problem:   Chest pain Active Problems:   Essential hypertension   Type 2 diabetes mellitus (HCC)   Difficulty with speech   Chronic heart failure with preserved ejection fraction (HFpEF) (HCC)   CAD (coronary artery disease)  Stroke Like Symptoms  Speech difficulty  Left Arm Numbness Described numbness/tingling/pain in L arm as one of the primary symptoms she had  Followed by some speech difficulties (described as slurred speech and word finding difficulty to my partner, to me she denied slurred speech) MRI was declined to to claustrophobia (declined even with medication for sedation) CTA head/neck without emergent LVO, moderate to severe stenosis of cavernous segments of both ICA's, moderate stenosis of P2 segment of L PCA + phalen's (unable to hold perform tinel's due to shoulder discomfort) on L, suspect component of carpal tunnel contributing, wrist splint ordered Neurology has been consulted  Chest pain  Coronary Artery Disease Atypical, poorly characterized - described as heaviness, time course unclear based on my discussion, but currently resolved EKG without concerning ST T wave changes Echo is pending S/p LHC 06/2019 with mild disease in major coronary arteries except small diagonal 1 and 2 with 80-90% ostial stenosis Current picture not concerning for ACS -> will consider discussing with Dr. Algie Coffer based on  echo results (or if recurrent symptoms) Elevated d dimer, follow CT PE protocol and LE [place]   Chronic HFpEF Last echo done in March 2021 showing EF 55 to 60%, grade 1 diastolic dysfunction, mild MVR She notes worsening LE edeam Will follow echo  Lasix 40 mg x1, then resume home torsemide Strict I/O, daily weights   Hypertension SBP currently in the 170s.  Will hold off antihypertensives until patient is evaluated by neurology given concern for ?TIA versus ?stroke.   CAD Continue aspirin, Plavix, and Lipitor.  Patient is taking Plavix daily but takes aspirin only once Tara Summers week at home and states she had discussed this with her cardiologist.  Will confirm.   Type 2 diabetes Last A1c 6.1 in March 2021, repeat ordered.  Glucose in the 130s.  Placed on sensitive sliding scale insulin ACHS.   Chronic anemia Hemoglobin stable.     DVT prophylaxis: lovenox Code Status: full Family Communication: none Disposition:   Status is: Observation The patient remains OBS appropriate and will d/c before 2 midnights.   Consultants:  neurology  Procedures:  Echo pending  Antimicrobials:  Anti-infectives (From admission, onward)    None       Subjective: Symptoms improved today Still has swelling  Objective: Vitals:   04/19/23 0515 04/19/23 0530 04/19/23 0545 04/19/23 0733  BP: (!) 188/67 (!) 174/69 (!) 173/68 (!) 189/69  Pulse: 97 94 93 87  Resp:   16 14  Temp:    98.9 F (37.2 C)  TempSrc:    Oral  SpO2: 99% 92% 98% 98%  Weight:      Height:       No intake or output  data in the 24 hours ending 04/19/23 0937 Filed Weights   04/18/23 1150  Weight: 102.1 kg    Examination:  General exam: Appears calm and comfortable  Respiratory system: Clear to auscultation. Respiratory effort normal. Cardiovascular system: RRR Central nervous system: Alert and oriented. CN 2-12 intact.  + phalen's sign on LUE.  Extremities: bilateral LEE   Data Reviewed: I have personally  reviewed following labs and imaging studies  CBC: Recent Labs  Lab 04/18/23 1154  WBC 5.0  HGB 11.8*  HCT 38.0  MCV 88.8  PLT 201    Basic Metabolic Panel: Recent Labs  Lab 04/18/23 1154  NA 143  K 3.6  CL 108  CO2 26  GLUCOSE 133*  BUN 20  CREATININE 0.97  CALCIUM 9.2    GFR: Estimated Creatinine Clearance: 62.8 mL/min (by C-G formula based on SCr of 0.97 mg/dL).  Liver Function Tests: No results for input(s): "AST", "ALT", "ALKPHOS", "BILITOT", "PROT", "ALBUMIN" in the last 168 hours.  CBG: Recent Labs  Lab 04/19/23 0815  GLUCAP 88     No results found for this or any previous visit (from the past 240 hours).       Radiology Studies: CT ANGIO HEAD NECK W WO CM Result Date: 04/19/2023 CLINICAL DATA:  Slurred speech EXAM: CT ANGIOGRAPHY HEAD AND NECK WITH AND WITHOUT CONTRAST TECHNIQUE: Multidetector CT imaging of the head and neck was performed using the standard protocol during bolus administration of intravenous contrast. Multiplanar CT image reconstructions and MIPs were obtained to evaluate the vascular anatomy. Carotid stenosis measurements (when applicable) are obtained utilizing NASCET criteria, using the distal internal carotid diameter as the denominator. RADIATION DOSE REDUCTION: This exam was performed according to the departmental dose-optimization program which includes automated exposure control, adjustment of the mA and/or kV according to patient size and/or use of iterative reconstruction technique. CONTRAST:  75mL OMNIPAQUE IOHEXOL 350 MG/ML SOLN COMPARISON:  None Available. FINDINGS: CT HEAD FINDINGS Brain: No mass,hemorrhage or extra-axial collection. Normal appearance of the parenchyma and CSF spaces. Vascular: Atherosclerotic calcification of the internal carotid arteries at the skull base. No abnormal hyperdensity of the major intracranial arteries or dural venous sinuses. Skull: The visualized skull base, calvarium and extracranial soft  tissues are normal. Sinuses/Orbits: No fluid levels or advanced mucosal thickening of the visualized paranasal sinuses. No mastoid or middle ear effusion. Normal orbits. CTA NECK FINDINGS Skeleton: No acute abnormality or high grade bony spinal canal stenosis. Other neck: Normal pharynx, larynx and major salivary glands. No cervical lymphadenopathy. Unremarkable thyroid gland. Upper chest: No pneumothorax or pleural effusion. No nodules or masses. Aortic arch: There is calcific atherosclerosis of the aortic arch. Conventional 3 vessel aortic branching pattern. RIGHT carotid system: No dissection, occlusion or aneurysm. Mild atherosclerotic calcification at the carotid bifurcation without hemodynamically significant stenosis. LEFT carotid system: No dissection, occlusion or aneurysm. Mild atherosclerotic calcification at the carotid bifurcation without hemodynamically significant stenosis. Vertebral arteries: Codominant configuration. There is no dissection, occlusion or flow-limiting stenosis to the skull base (V1-V3 segments). CTA HEAD FINDINGS POSTERIOR CIRCULATION: Mild atherosclerosis of the V4 segment without flow limiting stenosis. No proximal occlusion of the anterior or inferior cerebellar arteries. Basilar artery is normal. Superior cerebellar arteries are normal. Moderate stenosis of the P2 segment of the left PCA. Mild right P2 segment stenosis. ANTERIOR CIRCULATION: Bilateral ICA atherosclerosis with moderate-to-severe stenosis of the cavernous segments. Anterior cerebral arteries are normal. Middle cerebral arteries are normal. Venous sinuses: As permitted by contrast timing, patent. Anatomic variants:  None Review of the MIP images confirms the above findings. IMPRESSION: 1. No emergent large vessel occlusion. 2. 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 3. Moderate stenosis of the P2 segment of the left PCA. Aortic Atherosclerosis (ICD10-I70.0). Electronically Signed   By:  Deatra Robinson M.D.   On: 04/19/2023 01:37   DG Chest 2 View Result Date: 04/18/2023 CLINICAL DATA:  Chest pain EXAM: CHEST - 2 VIEW COMPARISON:  X-ray 09/26/2019. FINDINGS: Borderline cardiopericardial silhouette with Lamyah Creed calcified aorta. Slight prominence of the central vasculature. No pneumothorax, effusion or consolidation. No edema. Degenerative changes along the spine. IMPRESSION: Borderline size heart with some prominent central vasculature. Underinflation Electronically Signed   By: Karen Kays M.D.   On: 04/18/2023 13:47        Scheduled Meds:  [START ON 04/25/2023] aspirin EC  81 mg Oral Weekly   atorvastatin  40 mg Oral Daily   clopidogrel  75 mg Oral Daily   enoxaparin (LOVENOX) injection  40 mg Subcutaneous Q24H   insulin aspart  0-5 Units Subcutaneous QHS   insulin aspart  0-9 Units Subcutaneous TID WC   Continuous Infusions:   LOS: 0 days    Time spent: over 30 min    Lacretia Nicks, MD [facility]   To contact the attending provider between 7A-7P or the covering provider during after hours 7P-7A, please log into the web site [email or web address] and access using universal [place] password for that web site. If you do not have the password, please call the hospital operator.  04/19/2023, 9:37 AM

## 2023-04-19 NOTE — Plan of Care (Signed)
  Problem: Metabolic: Goal: Ability to maintain appropriate glucose levels will improve Outcome: Progressing   Problem: Tissue Perfusion: Goal: Adequacy of tissue perfusion will improve Outcome: Progressing   Problem: Clinical Measurements: Goal: Cardiovascular complication will be avoided Outcome: Progressing
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
## 2023-04-19 NOTE — Progress Notes (Signed)
Orthopedic Tech Progress Note Patient Details:  Tara Summers [birth year] [id number]  Ortho Devices Type of Ortho Device: Velcro wrist splint Ortho Device/Splint Location: LUE Ortho Device/Splint Interventions: Ordered, Application, Adjustment   Post Interventions Patient Tolerated: Well Instructions Provided: Care of device, Adjustment of device  [name] Carmine Savoy 04/19/2023, 6:58 PM

## 2023-04-19 NOTE — ED Notes (Signed)
Pt reports the chest pain was yesterday morning.  Currently not having any chest pain.  Pt does complain of left leg cramping.

## 2023-04-19 NOTE — Progress Notes (Signed)
Venous duplex lower ext  has been completed. Refer to [facility] under chart review to view preliminary results.   04/19/2023  1:32 PM [name], [name]
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
## 2023-04-19 NOTE — ED Provider Notes (Signed)
[place] EMERGENCY DEPARTMENT AT [facility] Provider Note   CSN: [id number] Arrival date & time: 04/18/23  1042     History  Chief Complaint  Patient presents with   Chest Pain    Tara Summers is a 72 y.o. female.  The history is provided by the patient and medical records.  Chest Pain  72 y.o. F with history of peripheral neuropathy, hypertension, chronic back pain, GERD, arthritis, diabetes, history of breast cancer, presenting to the ED with multiple concerns.  Patient states she woke up this morning and began having chest pain around 1030 or so.  States it was like a pressure in the left side of her chest.  She did feel some pain in the left shoulder as well as started to feel some tingling of the left hand.  She did take 2 aspirin when this began.  Shortly after she felt like her speech was off.  States this has been intermittent throughout the day today.  She has not had any frank confusion, numbness, weakness, or falls.  She has never had TIA or stroke in the past.  She does admit she has been under a lot of emotional stress recently as they just had the 2-year anniversary of their daughter's death on 04/30/23.  Home Medications Prior to Admission medications   Medication Sig Start Date End Date Taking? Authorizing Provider  albuterol (VENTOLIN HFA) 108 (90 Base) MCG/ACT inhaler Inhale 1 puff into the lungs every 6 (six) hours as needed for wheezing or shortness of breath. 11/06/19   [provider]  amLODipine (NORVASC) 10 MG tablet Take 10 mg by mouth daily.    [provider]  aspirin EC 81 MG tablet Take 81 mg by mouth 2 (two) times a week.     [provider]  atorvastatin (LIPITOR) 40 MG tablet Take 1 tablet (40 mg total) by mouth daily. 07/23/19   Orpah Cobb, MD  clopidogrel (PLAVIX) 75 MG tablet Take 1 tablet (75 mg total) by mouth daily. Patient taking differently: Take 75 mg by mouth daily. Has not taken in > 3 months 07/22/19    Orpah Cobb, MD  CONTOUR TEST test strip EVERY DAY 02/08/18   Romero Belling, MD  cyclobenzaprine (FLEXERIL) 10 MG tablet Take 1 tablet (10 mg total) by mouth 2 (two) times daily as needed for muscle spasms. 06/02/22   Margarita Grizzle, MD  hydrALAZINE (APRESOLINE) 25 MG tablet Take 1 tablet (25 mg total) by mouth 2 (two) times daily. 07/22/19   Orpah Cobb, MD  losartan (COZAAR) 100 MG tablet Take 1 tablet (100 mg total) by mouth daily. 07/23/19   Orpah Cobb, MD  metFORMIN (GLUCOPHAGE) 500 MG tablet Take 500 mg by mouth 2 (two) times daily with a meal.    [provider]  methylPREDNISolone (MEDROL DOSEPAK) 4 MG TBPK tablet Take as per package instructions 06/02/22   Margarita Grizzle, MD  metoprolol succinate (TOPROL-XL) 25 MG 24 hr tablet Take 25 mg by mouth daily. 02/15/18   [provider]  polyethylene glycol (MIRALAX / GLYCOLAX) packet Take 17 g by mouth daily as needed for moderate constipation.     [provider]  potassium chloride 20 MEQ TBCR Take 20 mEq by mouth 2 (two) times daily. 07/22/19   Orpah Cobb, MD  torsemide (DEMADEX) 20 MG tablet Take 1 tablet (20 mg total) by mouth 2 (two) times daily. 07/22/19   Orpah Cobb, MD  traMADol (ULTRAM) 50 MG tablet Take  1 tablet (50 mg total) by mouth every 6 (six) hours as needed for moderate pain or severe pain. 03/02/22   Abigail Miyamoto, MD  cloNIDine (CATAPRES) 0.1 MG tablet Take 0.1 mg by mouth 2 (two) times daily.    08/04/19  [provider]      Allergies    Patient has no known allergies.    Review of Systems   Review of Systems  Cardiovascular:  Positive for chest pain.  All other systems reviewed and are negative.   Physical Exam Updated Vital Signs BP (!) 167/68   Pulse 85   Temp 98.4 F (36.9 C)   Resp 16   Ht 5\' 4"  (1.626 m)   Wt 102.1 kg   SpO2 100%   BMI 38.62 kg/m   Physical Exam Vitals and nursing note reviewed.  Constitutional:      Appearance: She is well-developed.   HENT:     Head: Normocephalic and atraumatic.  Eyes:     Conjunctiva/sclera: Conjunctivae normal.     Pupils: Pupils are equal, round, and reactive to light.  Cardiovascular:     Rate and Rhythm: Normal rate and regular rhythm.     Heart sounds: Normal heart sounds.  Pulmonary:     Effort: Pulmonary effort is normal.     Breath sounds: Normal breath sounds.  Abdominal:     General: Bowel sounds are normal.     Palpations: Abdomen is soft.  Musculoskeletal:        General: Normal range of motion.     Cervical back: Normal range of motion.  Skin:    General: Skin is warm and dry.  Neurological:     Mental Status: She is alert and oriented to person, place, and time.     Comments: AAOx3, answering questions and following commands appropriately; equal strength UE and LE bilaterally; CN grossly intact; moves all extremities appropriately without ataxia; no focal neuro deficits or facial asymmetry appreciated, speech is clear     ED Results / Procedures / Treatments   Labs (all labs ordered are listed, but only abnormal results are displayed) Labs Reviewed  BASIC METABOLIC PANEL - Abnormal; Notable for the following components:      Result Value   Glucose, Bld 133 (*)    All other components within normal limits  CBC - Abnormal; Notable for the following components:   Hemoglobin 11.8 (*)    All other components within normal limits  TROPONIN I (HIGH SENSITIVITY)  TROPONIN I (HIGH SENSITIVITY)    EKG EKG Interpretation Date/Time:  Wednesday April 18 2023 11:43:28 EST Ventricular Rate:  91 PR Interval:  156 QRS Duration:  68 QT Interval:  370 QTC Calculation: 455 R Axis:   -10  Text Interpretation: Normal sinus rhythm Possible Anterolateral infarct , age undetermined Abnormal ECG When compared with ECG of 28-Feb-2022 10:24, PREVIOUS ECG IS PRESENT Confirmed by Kennis Carina [phone]) on 04/18/2023 11:55:58 PM  Radiology CT ANGIO HEAD NECK W WO CM Result Date:  04/19/2023 CLINICAL DATA:  Slurred speech EXAM: CT ANGIOGRAPHY HEAD AND NECK WITH AND WITHOUT CONTRAST TECHNIQUE: Multidetector CT imaging of the head and neck was performed using the standard protocol during bolus administration of intravenous contrast. Multiplanar CT image reconstructions and MIPs were obtained to evaluate the vascular anatomy. Carotid stenosis measurements (when applicable) are obtained utilizing NASCET criteria, using the distal internal carotid diameter as the denominator. RADIATION DOSE REDUCTION: This exam was performed according to the departmental dose-optimization program which includes  automated exposure control, adjustment of the mA and/or kV according to patient size and/or use of iterative reconstruction technique. CONTRAST:  75mL OMNIPAQUE IOHEXOL 350 MG/ML SOLN COMPARISON:  None Available. FINDINGS: CT HEAD FINDINGS Brain: No mass,hemorrhage or extra-axial collection. Normal appearance of the parenchyma and CSF spaces. Vascular: Atherosclerotic calcification of the internal carotid arteries at the skull base. No abnormal hyperdensity of the major intracranial arteries or dural venous sinuses. Skull: The visualized skull base, calvarium and extracranial soft tissues are normal. Sinuses/Orbits: No fluid levels or advanced mucosal thickening of the visualized paranasal sinuses. No mastoid or middle ear effusion. Normal orbits. CTA NECK FINDINGS Skeleton: No acute abnormality or high grade bony spinal canal stenosis. Other neck: Normal pharynx, larynx and major salivary glands. No cervical lymphadenopathy. Unremarkable thyroid gland. Upper chest: No pneumothorax or pleural effusion. No nodules or masses. Aortic arch: There is calcific atherosclerosis of the aortic arch. Conventional 3 vessel aortic branching pattern. RIGHT carotid system: No dissection, occlusion or aneurysm. Mild atherosclerotic calcification at the carotid bifurcation without hemodynamically significant stenosis. LEFT  carotid system: No dissection, occlusion or aneurysm. Mild atherosclerotic calcification at the carotid bifurcation without hemodynamically significant stenosis. Vertebral arteries: Codominant configuration. There is no dissection, occlusion or flow-limiting stenosis to the skull base (V1-V3 segments). CTA HEAD FINDINGS POSTERIOR CIRCULATION: Mild atherosclerosis of the V4 segment without flow limiting stenosis. No proximal occlusion of the anterior or inferior cerebellar arteries. Basilar artery is normal. Superior cerebellar arteries are normal. Moderate stenosis of the P2 segment of the left PCA. Mild right P2 segment stenosis. ANTERIOR CIRCULATION: Bilateral ICA atherosclerosis with moderate-to-severe stenosis of the cavernous segments. Anterior cerebral arteries are normal. Middle cerebral arteries are normal. Venous sinuses: As permitted by contrast timing, patent. Anatomic variants: None Review of the MIP images confirms the above findings. IMPRESSION: 1. No emergent large vessel occlusion. 2. 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 3. Moderate stenosis of the P2 segment of the left PCA. Aortic Atherosclerosis (ICD10-I70.0). Electronically Signed   By: Deatra Robinson M.D.   On: 04/19/2023 01:37   DG Chest 2 View Result Date: 04/18/2023 CLINICAL DATA:  Chest pain EXAM: CHEST - 2 VIEW COMPARISON:  X-ray 09/26/2019. FINDINGS: Borderline cardiopericardial silhouette with a calcified aorta. Slight prominence of the central vasculature. No pneumothorax, effusion or consolidation. No edema. Degenerative changes along the spine. IMPRESSION: Borderline size heart with some prominent central vasculature. Underinflation Electronically Signed   By: Karen Kays M.D.   On: 04/18/2023 13:47    Procedures Procedures    Medications Ordered in ED Medications  iohexol (OMNIPAQUE) 350 MG/ML injection 75 mL (75 mLs Intravenous Contrast Given 04/19/23 0121)    ED Course/ Medical Decision  Making/ A&P                                 Medical Decision Making Amount and/or Complexity of Data Reviewed Labs: ordered. Radiology: ordered and independent interpretation performed. ECG/medicine tests: ordered and independent interpretation performed.  Risk Prescription drug management. Decision regarding hospitalization.   72 year old female presenting to the ED with chest pain.  Pressure-like chest pain this morning, some radiation into the shoulder.  Also had some tingling of the left hand and speech was a bit off.  Has since returned to normal, chest pain has resolved.  EKG NSR without acute ischemic changes.  Labs overall reassuring-- trop negative x2.  CXR is clear.  Left arm tingling seems  associated with left arm pain. CTA negative for acute findings.  Patient has high risk heart score of 7.  Last cath 2021 with multiple lesions, some 80-95%.  Does not appear to have had any recent workup since that time.  Would likely benefit from chest pain rule out.  Discussed with Dr. Loney Loh-- will admit for ongoing care.  Can discuss with patient's cardiologist, Dr. Algie Coffer, in the morning if needed.  Final Clinical Impression(s) / ED Diagnoses Final diagnoses:  Chest pain in adult    Rx / DC Orders ED Discharge Orders     None         Garlon Hatchet, PA-C 04/19/23 0529    Sabas Sous, MD 04/19/23 [id number]
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
## 2023-04-20 DIAGNOSIS — R200 Anesthesia of skin: Secondary | ICD-10-CM

## 2023-04-20 DIAGNOSIS — R079 Chest pain, unspecified: Secondary | ICD-10-CM | POA: Diagnosis not present

## 2023-04-20 LAB — COMPREHENSIVE METABOLIC PANEL
ALT: 14 U/L (ref 0–44)
AST: 13 U/L — ABNORMAL LOW (ref 15–41)
Albumin: 3.1 g/dL — ABNORMAL LOW (ref 3.5–5.0)
Alkaline Phosphatase: 64 U/L (ref 38–126)
Anion gap: 8 (ref 5–15)
BUN: 22 mg/dL (ref 8–23)
CO2: 27 mmol/L (ref 22–32)
Calcium: 8.8 mg/dL — ABNORMAL LOW (ref 8.9–10.3)
Chloride: 105 mmol/L (ref 98–111)
Creatinine, Ser: 0.89 mg/dL (ref 0.44–1.00)
GFR, Estimated: >60 mL/min (ref >60)
Glucose, Bld: 104 mg/dL — ABNORMAL HIGH (ref 70–99)
Potassium: 3.4 mmol/L — ABNORMAL LOW (ref 3.5–5.1)
Sodium: 140 mmol/L (ref 135–145)
Total Bilirubin: 1 mg/dL (ref <1.2)
Total Protein: 6.3 g/dL — ABNORMAL LOW (ref 6.5–8.1)

## 2023-04-20 LAB — CBC WITH DIFFERENTIAL/PLATELET
Abs Immature Granulocytes: 0.02 10*3/uL (ref 0.00–0.07)
Basophils Absolute: 0 10*3/uL (ref 0.0–0.1)
Basophils Relative: 0 %
Eosinophils Absolute: 0.3 10*3/uL (ref 0.0–0.5)
Eosinophils Relative: 6 %
HCT: 38.5 % (ref 36.0–46.0)
Hemoglobin: 12.3 g/dL (ref 12.0–15.0)
Immature Granulocytes: 0 %
Lymphocytes Relative: 38 %
Lymphs Abs: 2 10*3/uL (ref 0.7–4.0)
MCH: 27.8 pg (ref 26.0–34.0)
MCHC: 31.9 g/dL (ref 30.0–36.0)
MCV: 86.9 fL (ref 80.0–100.0)
Monocytes Absolute: 0.4 10*3/uL (ref 0.1–1.0)
Monocytes Relative: 8 %
Neutro Abs: 2.5 10*3/uL (ref 1.7–7.7)
Neutrophils Relative %: 48 %
Platelets: 203 10*3/uL (ref 150–400)
RBC: 4.43 MIL/uL (ref 3.87–5.11)
RDW: 13 % (ref 11.5–15.5)
WBC: 5.3 10*3/uL (ref 4.0–10.5)
nRBC: 0 % (ref 0.0–0.2)

## 2023-04-20 LAB — GLUCOSE, CAPILLARY
Glucose-Capillary: 96 mg/dL (ref 70–99)
Glucose-Capillary: 94 mg/dL (ref 70–99)

## 2023-04-20 LAB — MAGNESIUM: Magnesium: 1.9 mg/dL (ref 1.7–2.4)

## 2023-04-20 LAB — PHOSPHORUS: Phosphorus: 3.8 mg/dL (ref 2.5–4.6)

## 2023-04-20 MED ORDER — LOSARTAN POTASSIUM 50 MG PO TABS
100.0000 mg | ORAL_TABLET | Freq: Every day | ORAL | Status: DC
  Administered 2023-04-20: 100 mg via ORAL
  Filled 2023-04-20: qty 2

## 2023-04-20 MED ORDER — METOPROLOL SUCCINATE ER 50 MG PO TB24
50.0000 mg | ORAL_TABLET | Freq: Every day | ORAL | Status: DC
  Administered 2023-04-20: 50 mg via ORAL
  Filled 2023-04-20: qty 1

## 2023-04-20 MED ORDER — AMLODIPINE BESYLATE 10 MG PO TABS
10.0000 mg | ORAL_TABLET | Freq: Every day | ORAL | Status: DC
  Administered 2023-04-20: 10 mg via ORAL
  Filled 2023-04-20: qty 1

## 2023-04-20 MED ORDER — HYDRALAZINE HCL 20 MG/ML IJ SOLN
5.0000 mg | INTRAMUSCULAR | Status: AC
  Administered 2023-04-20: 5 mg via INTRAVENOUS

## 2023-04-20 MED ORDER — HYDRALAZINE HCL 50 MG PO TABS
50.0000 mg | ORAL_TABLET | Freq: Two times a day (BID) | ORAL | Status: DC
  Administered 2023-04-20: 50 mg via ORAL
  Filled 2023-04-20: qty 1

## 2023-04-20 MED ORDER — HYDRALAZINE HCL 25 MG PO TABS: 50.0000 mg | ORAL_TABLET | Freq: Two times a day (BID) | ORAL | Status: AC

## 2023-04-20 NOTE — TOC CM/SW Note (Signed)
Transition of Care [facility]) - Inpatient Brief Assessment   Patient Details  Name: Tara Summers MRN: [id number] Date of Birth: 1950-08-04  Transition of Care [facility]) CM/SW Contact:    Gala Lewandowsky, RN Phone Number: 04/20/2023, 3:26 PM   Clinical Narrative:  Patient presented for chest pain. PTA patient was from home with spouse. Patient has PCP and Insurance. No home needs identified at this time.   Transition of Care Asessment: Insurance and Status: Insurance coverage has been reviewed Patient has primary care physician: Yes Home environment has been reviewed: reviewed Prior level of function:: independent Prior/Current Home Services: No current home services Social Drivers of Health Review: SDOH reviewed no interventions necessary Readmission risk has been reviewed: Yes Transition of care needs: no transition of care needs at this time

## 2023-04-20 NOTE — Discharge Summary (Signed)
Physician Discharge Summary  MARBELLA WATCHMAN YNW:[id number] DOB: 11/02/1950 DOA: 04/18/2023  PCP: Leilani Able, MD  Admit date: 04/18/2023 Discharge date: 04/20/2023  Time spent: 40 minutes  Recommendations for Outpatient Follow-up:  Follow outpatient CBC/CMP  Follow with neurology outpatient - EMG/NCS, possible MRI Follow with cardiology outpatient Follow pulmonary nodules outpatient - repeat imaging in 12 months Follow postsurgical changes of R breast with rim enhancing fluid collection and coarse calcification, suspected seroma -> needs to be up to date with mammograms  Discharge Diagnoses:  Principal Problem:   Chest pain Active Problems:   Essential hypertension   Type 2 diabetes mellitus (HCC)   Difficulty with speech   Chronic heart failure with preserved ejection fraction (HFpEF) (HCC)   CAD (coronary artery disease)   Discharge Condition: stable  Diet recommendation: heart healthy  Filed Weights   04/18/23 1150 04/19/23 1427  Weight: 102.1 kg 98.3 kg    History of present illness:   Tara Summers is Tara Summers 72 y.o. female with medical history significant of chronic HFpEF, hypertrophic nonobstructive cardiomyopathy, CAD, hypertension, type 2 diabetes, obesity, chronic bilateral lower extremity edema/lymphedema, history of breast cancer, GERD, arthritis, sleep apnea presented to the ED with complaints of chest pain and slurred speech.   Admitted for CP rule out and concern for stroke like symptoms.  See below for additional details.  Hospital Course:  Assessment and Plan:  Stroke Like Symptoms  Speech difficulty  Left Arm Numbness Described numbness/tingling/pain in L arm as one of the primary symptoms she had  Followed by some speech difficulties (described as slurred speech and word finding difficulty to my partner, to me she denied slurred speech) MRI was declined to to claustrophobia (declined even with medication for sedation) CTA head/neck without emergent  LVO, moderate to severe stenosis of cavernous segments of both ICA's, moderate stenosis of P2 segment of L PCA + phalen's (unable to hold perform tinel's due to shoulder discomfort) on L, suspect component of carpal tunnel contributing, wrist splint ordered Neurology has been consulted - recommending outpatient EMG/NCS, consider outpatient MRI.  Recommended outpatient follow up for spinal imaging and further evaluation of chronic cervical radiculopathy.   Chest pain  Coronary Artery Disease Atypical, poorly characterized - described as heaviness, time course unclear based on my discussion, but currently resolved EKG without concerning ST T wave changes.  Negative troponins Echo is without RWMA, normal EF, grade 1 diastolic dysfunction CT PE protocol without PE, negative LE [place] for DVT S/p LHC 06/2019 with mild disease in major coronary arteries except small diagonal 1 and 2 with 80-90% ostial stenosis Current picture not concerning for ACS -> follow outpatient    Chronic HFpEF Last echo done in March 2021 showing EF 55 to 60%, grade 1 diastolic dysfunction, mild MVR She notes worsening LE edeam Will follow echo (as above) Home torsemide Strict I/O, daily weights   Hypertension Resume home regimen    CAD Continue aspirin, Plavix, and Lipitor.  Patient is taking Plavix daily but takes aspirin only once Kadiatou Oplinger week at home and states she had discussed this with her cardiologist.     Type 2 diabetes Last A1c 6.1 in March 2021, repeat ordered.  Glucose in the 130s.  Placed on sensitive sliding scale insulin ACHS.   Chronic anemia Hemoglobin stable.   Abnormal Imaging  Needs follow up of pulmonary nodule and post op changes of R breast     Procedures: Echo MPRESSIONS     1. Left ventricular ejection fraction,  by estimation, is 60 to 65%. The  left ventricle has normal function. The left ventricle has no regional  wall motion abnormalities. There is mild concentric left ventricular   hypertrophy. Left ventricular diastolic  parameters are consistent with Grade I diastolic dysfunction (impaired  relaxation). The average left ventricular global longitudinal strain is  -18.0 %. The global longitudinal strain is normal.   2. Right ventricular systolic function is normal. The right ventricular  size is normal.   3. The mitral valve is abnormal. No evidence of mitral valve  regurgitation.   4. The aortic valve was not well visualized. Aortic valve regurgitation  is not visualized.   5. The inferior vena cava is normal in size with <50% respiratory  variability, suggesting right atrial pressure of 8 mmHg.   Comparison(s): No significant change from prior study.    Consultations: none  Discharge Exam: Vitals:   04/20/23 0743 04/20/23 1154  BP: (!) 187/67 (!) 181/71  Pulse:    Resp: 19 20  Temp: 97.8 F (36.6 C) 98.6 F (37 C)  SpO2:     No complaints Husband at bedside  General: No acute distress. Cardiovascular: RRR Lungs: unlabored Neurological: Alert and oriented 3. Moves all extremities 4 . Cranial nerves II through XII grossly intact. Extremities: trace edema  Discharge Instructions   Discharge Instructions     Call MD for:  difficulty breathing, headache or visual disturbances   Complete by: As directed    Call MD for:  extreme fatigue   Complete by: As directed    Call MD for:  hives   Complete by: As directed    Call MD for:  persistant dizziness or light-headedness   Complete by: As directed    Call MD for:  persistant nausea and vomiting   Complete by: As directed    Call MD for:  redness, tenderness, or signs of infection (pain, swelling, redness, odor or green/yellow discharge around incision site)   Complete by: As directed    Call MD for:  severe uncontrolled pain   Complete by: As directed    Call MD for:  temperature >100.4   Complete by: As directed    Diet - low sodium heart healthy   Complete by: As directed    Discharge  instructions   Complete by: As directed    You were seen for chest pain and left arm pain.  Your chest pain workup was reassuring.  Your chest CT didn't show any blood clots.  Your ultrasound of your heart showed no wall motion abnormalities and your heart enzymes were negative.  Follow up with your cardiologist as an outpatient.  Continue your aspirin, plavix, and statin.  Your left arm pain/numbness maybe due to cervical radiculopathy (disease of the spine in the neck irritating the nerves leaving the spine).  You should follow with neurology outpatient.  They'll consider an EMG/NCS (electromyography/nerve conduction study).  Use the wrist splint at night if you tolerate it.  You have have component of carpal tunnel as well contributing.   You had incidental findings that warrant outpatient follow up: 1. You have small lung nodules that warrant follow up with your PCP (you may need Sedrick Tober repeat CT scan within 1 year) 2. You had post surgical changes of the right breast that were seen.  You should follow this up with your PCP and ensure you're up to date on your mammograms.  Return for new, recurrent, or worsening symptoms.  Please ask your PCP  to request records from this hospitalization so they know what was done and what the next steps will be.   Increase activity slowly   Complete by: As directed       Allergies as of 04/20/2023   No Known Allergies      Medication List     STOP taking these medications    metFORMIN 500 MG tablet Commonly known as: GLUCOPHAGE       TAKE these medications    amLODipine 10 MG tablet Commonly known as: NORVASC Take 10 mg by mouth daily.   aspirin EC 81 MG tablet Take 81 mg by mouth once Reginal Wojcicki week.   atorvastatin 40 MG tablet Commonly known as: LIPITOR Take 1 tablet (40 mg total) by mouth daily.   clopidogrel 75 MG tablet Commonly known as: PLAVIX Take 1 tablet (75 mg total) by mouth daily.   Contour Test test strip Generic drug:  glucose blood EVERY DAY   Farxiga 10 MG Tabs tablet Generic drug: dapagliflozin propanediol Take 10 mg by mouth daily.   hydrALAZINE 25 MG tablet Commonly known as: APRESOLINE Take 2 tablets (50 mg total) by mouth 2 (two) times daily.   isosorbide mononitrate 30 MG 24 hr tablet Commonly known as: IMDUR Take 30 mg by mouth in the morning and at bedtime.   losartan 100 MG tablet Commonly known as: COZAAR Take 1 tablet (100 mg total) by mouth daily.   metoprolol succinate 50 MG 24 hr tablet Commonly known as: TOPROL-XL Take 50 mg by mouth daily.   Ozempic (0.25 or 0.5 MG/DOSE) 2 MG/3ML Sopn Generic drug: Semaglutide(0.25 or 0.5MG /DOS) Inject 0.5 mg into the skin once Shaena Parkerson week.   polyethylene glycol 17 g packet Commonly known as: MIRALAX / GLYCOLAX Take 17 g by mouth daily as needed for moderate constipation.   Potassium Chloride ER 20 MEQ Tbcr Take 20 mEq by mouth 2 (two) times daily.   torsemide 20 MG tablet Commonly known as: DEMADEX Take 1 tablet (20 mg total) by mouth 2 (two) times daily. What changed:  how much to take when to take this       No Known Allergies    The results of significant diagnostics from this hospitalization (including imaging, microbiology, ancillary and laboratory) are listed below for reference.    Significant Diagnostic Studies: VAS [place] LOWER EXTREMITY VENOUS (DVT) Result Date: 04/19/2023  Lower Venous DVT Study Patient Name:  KEYOSHIA EATMON  Date of Exam:   04/19/2023 Medical Rec #: [id number]        Accession #:    [id number] Date of Birth: [birth year]        Patient Gender: F Patient Age:   67 years Exam Location:  [facility] Procedure:      VAS [place] LOWER EXTREMITY VENOUS (DVT) Referring Phys: Zorah Backes POWELL JR --------------------------------------------------------------------------------  Indications: Swelling, and Pain.  Risk Factors: Obesity. Limitations: Poor ultrasound/tissue interface and body habitus. Comparison Study: 04/16/20  Performing Technologist: Marilynne Halsted RDMS, RVT  Examination Guidelines: Lillyauna Jenkinson complete evaluation includes B-mode imaging, spectral Doppler, color Doppler, and power Doppler as needed of all accessible portions of each vessel. Bilateral testing is considered an integral part of Marco Raper complete examination. Limited examinations for reoccurring indications may be performed as noted. The reflux portion of the exam is performed with the patient in reverse Trendelenburg.  +---------+---------------+---------+-----------+----------+-------------------+ RIGHT    CompressibilityPhasicitySpontaneityPropertiesThrombus Aging      +---------+---------------+---------+-----------+----------+-------------------+ CFV      Full  Yes      Yes                                      +---------+---------------+---------+-----------+----------+-------------------+ SFJ      Full                                                             +---------+---------------+---------+-----------+----------+-------------------+ FV Prox  Full                                                             +---------+---------------+---------+-----------+----------+-------------------+ FV Mid   Full                                                             +---------+---------------+---------+-----------+----------+-------------------+ FV DistalFull                                                             +---------+---------------+---------+-----------+----------+-------------------+ PFV      Full                                                             +---------+---------------+---------+-----------+----------+-------------------+ POP      Full           Yes      Yes                                      +---------+---------------+---------+-----------+----------+-------------------+ PTV      Full                                                              +---------+---------------+---------+-----------+----------+-------------------+ PERO                                                  not well visualized +---------+---------------+---------+-----------+----------+-------------------+  +---------+---------------+---------+-----------+----------+-------------------+ LEFT     CompressibilityPhasicitySpontaneityPropertiesThrombus Aging      +---------+---------------+---------+-----------+----------+-------------------+ CFV      Full           Yes      Yes                                      +---------+---------------+---------+-----------+----------+-------------------+  SFJ      Full                                                             +---------+---------------+---------+-----------+----------+-------------------+ FV Prox  Full                                                             +---------+---------------+---------+-----------+----------+-------------------+ FV Mid   Full                                                             +---------+---------------+---------+-----------+----------+-------------------+ FV DistalFull                                                             +---------+---------------+---------+-----------+----------+-------------------+ PFV      Full                                                             +---------+---------------+---------+-----------+----------+-------------------+ POP      Full           Yes      Yes                                      +---------+---------------+---------+-----------+----------+-------------------+ PTV      Full                                                             +---------+---------------+---------+-----------+----------+-------------------+ PERO                                                  not well visualized +---------+---------------+---------+-----------+----------+-------------------+    Summary: RIGHT: - There is no evidence of deep vein thrombosis in the lower extremity. However, portions of this examination were limited- see technologist comments above.  LEFT: - There is no evidence of deep vein thrombosis in the lower extremity. However, portions of this examination were limited- see technologist comments above.  *See table(s) above for measurements and observations. Electronically signed by Carolynn Sayers on 04/19/2023 at 3:46:48 PM.    Final    CT Angio Chest Pulmonary Embolism (PE)  W or WO Contrast Result Date: 04/19/2023 CLINICAL DATA:  Elevated D-dimer chest pain short of breath EXAM: CT ANGIOGRAPHY CHEST WITH CONTRAST TECHNIQUE: Multidetector CT imaging of the chest was performed using the standard protocol during bolus administration of intravenous contrast. Multiplanar CT image reconstructions and MIPs were obtained to evaluate the vascular anatomy. RADIATION DOSE REDUCTION: This exam was performed according to the departmental dose-optimization program which includes automated exposure control, adjustment of the mA and/or kV according to patient size and/or use of iterative reconstruction technique. CONTRAST:  75mL OMNIPAQUE IOHEXOL 350 MG/ML SOLN COMPARISON:  Chest x-ray 04/18/2023 FINDINGS: Cardiovascular: Satisfactory opacification of the pulmonary arteries to the segmental level. No evidence of pulmonary embolism. Moderate aortic atherosclerosis. Coronary vascular calcification. Cardiomegaly. Mediastinum/Nodes: Patent trachea. No thyroid mass. No suspicious lymph nodes. Esophagus within normal limits. Lungs/Pleura: No acute airspace disease. Scattered calcified and noncalcified pulmonary nodules measuring up to 4 mm in the left upper lobe. Upper Abdomen: No acute finding. Musculoskeletal: No acute or suspicious osseous abnormality. Postsurgical changes of the right breast with thick rim enhancing fluid collection and coarse calcification likely Ramata Strothman seroma. Review of the MIP images  confirms the above findings. IMPRESSION: 1. Negative for acute pulmonary embolus or aortic dissection. 2. Cardiomegaly. 3. Scattered calcified and noncalcified pulmonary nodules measuring up to 4 mm. No follow-up needed if patient is low-risk (and has no known or suspected primary neoplasm). Non-contrast chest CT can be considered in 12 months if patient is high-risk. This recommendation follows the consensus statement: Guidelines for Management of Incidental Pulmonary Nodules Detected on CT Images: From the Fleischner Society 2017; Radiology 2017; 284:228-243. 4. Postsurgical changes of the right breast with thick rim enhancing fluid collection and coarse calcification likely Alila Sotero seroma. Correlate with surgical history 5. Aortic atherosclerosis. Aortic Atherosclerosis (ICD10-I70.0). Electronically Signed   By: Jasmine Pang M.D.   On: 04/19/2023 15:30   ECHOCARDIOGRAM COMPLETE Result Date: 04/19/2023    ECHOCARDIOGRAM REPORT   Patient Name:   EMIYA NORCUTT Date of Exam: 04/19/2023 Medical Rec #:  [id number]       Height:       64.0 in Accession #:    [id number]      Weight:       225.0 lb Date of Birth:  1950/11/14       BSA:          2.057 m Patient Age:    72 years        BP:           189/69 mmHg Patient Gender: F               HR:           91 bpm. Exam Location:  Inpatient Procedure: 2D Echo, 3D Echo, Cardiac Doppler, Color Doppler and Strain Analysis Indications:    Chest pain  History:        Patient has prior history of Echocardiogram examinations, most                 recent 07/19/2019. CHF, CAD, Signs/Symptoms:Chest Pain; Risk                 Factors:Diabetes.  Sonographer:    Webb Laws Referring Phys: [id number] VASUNDHRA RATHORE IMPRESSIONS  1. Left ventricular ejection fraction, by estimation, is 60 to 65%. The left ventricle has normal function. The left ventricle has no regional wall motion abnormalities. There is mild concentric left ventricular hypertrophy. Left ventricular diastolic  parameters are consistent with  Grade I diastolic dysfunction (impaired relaxation). The average left ventricular global longitudinal strain is -18.0 %. The global longitudinal strain is normal.  2. Right ventricular systolic function is normal. The right ventricular size is normal.  3. The mitral valve is abnormal. No evidence of mitral valve regurgitation.  4. The aortic valve was not well visualized. Aortic valve regurgitation is not visualized.  5. The inferior vena cava is normal in size with <50% respiratory variability, suggesting right atrial pressure of 8 mmHg. Comparison(s): No significant change from prior study. FINDINGS  Left Ventricle: Left ventricular ejection fraction, by estimation, is 60 to 65%. The left ventricle has normal function. The left ventricle has no regional wall motion abnormalities. The average left ventricular global longitudinal strain is -18.0 %. The global longitudinal strain is normal. The left ventricular internal cavity size was normal in size. There is mild concentric left ventricular hypertrophy. Left ventricular diastolic parameters are consistent with Grade I diastolic dysfunction (impaired relaxation). Right Ventricle: The right ventricular size is normal. Right ventricular systolic function is normal. Left Atrium: Left atrial size was normal in size. Right Atrium: Right atrial size was normal in size. Pericardium: There is no evidence of pericardial effusion. Mitral Valve: The mitral valve is abnormal. No evidence of mitral valve regurgitation. Tricuspid Valve: Tricuspid valve regurgitation is not demonstrated. Aortic Valve: The aortic valve was not well visualized. Aortic valve regurgitation is not visualized. Pulmonic Valve: Pulmonic valve regurgitation is not visualized. Aorta: The aortic root and ascending aorta are structurally normal, with no evidence of dilitation. Venous: The inferior vena cava is normal in size with less than 50% respiratory variability, suggesting  right atrial pressure of 8 mmHg. IAS/Shunts: No atrial level shunt detected by color flow Doppler.  LEFT VENTRICLE PLAX 2D LVIDd:         3.80 cm     Diastology LVIDs:         2.20 cm     LV e' medial:    4.24 cm/s LV PW:         1.10 cm     LV E/e' medial:  20.4 LV IVS:        1.30 cm     LV e' lateral:   5.98 cm/s LVOT diam:     1.80 cm     LV E/e' lateral: 14.5 LV SV:         66 LV SV Index:   32          2D Longitudinal Strain LVOT Area:     2.54 cm    2D Strain GLS Avg:     -18.0 %  LV Volumes (MOD) LV vol d, MOD A2C: 54.3 ml 3D Volume EF: LV vol d, MOD A4C: 59.2 ml 3D EF:        53 % LV vol s, MOD A2C: 14.1 ml LV EDV:       133 ml LV vol s, MOD A4C: 29.1 ml LV ESV:       62 ml LV SV MOD A2C:     40.2 ml LV SV:        71 ml LV SV MOD A4C:     59.2 ml LV SV MOD BP:      35.5 ml RIGHT VENTRICLE             IVC RV Basal diam:  2.80 cm     IVC diam: 1.50 cm RV S prime:     19.60 cm/s TAPSE (M-mode):  2.7 cm LEFT ATRIUM             Index        RIGHT ATRIUM          Index LA diam:        3.20 cm 1.56 cm/m   RA Area:     8.46 cm LA Vol (A2C):   49.7 ml 24.16 ml/m  RA Volume:   11.60 ml 5.64 ml/m LA Vol (A4C):   42.2 ml 20.52 ml/m LA Biplane Vol: 46.1 ml 22.41 ml/m  AORTIC VALVE LVOT Vmax:   117.00 cm/s LVOT Vmean:  90.800 cm/s LVOT VTI:    0.259 m  AORTA Ao Root diam: 2.30 cm Ao Asc diam:  3.50 cm MITRAL VALVE MV Area (PHT): 3.93 cm     SHUNTS MV Decel Time: 193 msec     Systemic VTI:  0.26 m MV E velocity: 86.60 cm/s   Systemic Diam: 1.80 cm MV Lorain Fettes velocity: 124.00 cm/s MV E/Vincenzina Jagoda ratio:  0.70 Photographer signed by Carolan Clines Signature Date/Time: 04/19/2023/9:39:37 AM    Final    CT ANGIO HEAD NECK W WO CM Result Date: 04/19/2023 CLINICAL DATA:  Slurred speech EXAM: CT ANGIOGRAPHY HEAD AND NECK WITH AND WITHOUT CONTRAST TECHNIQUE: Multidetector CT imaging of the head and neck was performed using the standard protocol during bolus administration of intravenous contrast. Multiplanar CT image  reconstructions and MIPs were obtained to evaluate the vascular anatomy. Carotid stenosis measurements (when applicable) are obtained utilizing NASCET criteria, using the distal internal carotid diameter as the denominator. RADIATION DOSE REDUCTION: This exam was performed according to the departmental dose-optimization program which includes automated exposure control, adjustment of the mA and/or kV according to patient size and/or use of iterative reconstruction technique. CONTRAST:  75mL OMNIPAQUE IOHEXOL 350 MG/ML SOLN COMPARISON:  None Available. FINDINGS: CT HEAD FINDINGS Brain: No mass,hemorrhage or extra-axial collection. Normal appearance of the parenchyma and CSF spaces. Vascular: Atherosclerotic calcification of the internal carotid arteries at the skull base. No abnormal hyperdensity of the major intracranial arteries or dural venous sinuses. Skull: The visualized skull base, calvarium and extracranial soft tissues are normal. Sinuses/Orbits: No fluid levels or advanced mucosal thickening of the visualized paranasal sinuses. No mastoid or middle ear effusion. Normal orbits. CTA NECK FINDINGS Skeleton: No acute abnormality or high grade bony spinal canal stenosis. Other neck: Normal pharynx, larynx and major salivary glands. No cervical lymphadenopathy. Unremarkable thyroid gland. Upper chest: No pneumothorax or pleural effusion. No nodules or masses. Aortic arch: There is calcific atherosclerosis of the aortic arch. Conventional 3 vessel aortic branching pattern. RIGHT carotid system: No dissection, occlusion or aneurysm. Mild atherosclerotic calcification at the carotid bifurcation without hemodynamically significant stenosis. LEFT carotid system: No dissection, occlusion or aneurysm. Mild atherosclerotic calcification at the carotid bifurcation without hemodynamically significant stenosis. Vertebral arteries: Codominant configuration. There is no dissection, occlusion or flow-limiting stenosis to the  skull base (V1-V3 segments). CTA HEAD FINDINGS POSTERIOR CIRCULATION: Mild atherosclerosis of the V4 segment without flow limiting stenosis. No proximal occlusion of the anterior or inferior cerebellar arteries. Basilar artery is normal. Superior cerebellar arteries are normal. Moderate stenosis of the P2 segment of the left PCA. Mild right P2 segment stenosis. ANTERIOR CIRCULATION: Bilateral ICA atherosclerosis with moderate-to-severe stenosis of the cavernous segments. Anterior cerebral arteries are normal. Middle cerebral arteries are normal. Venous sinuses: As permitted by contrast timing, patent. Anatomic variants: None Review of the MIP images confirms the above findings. IMPRESSION: 1. No emergent  large vessel occlusion. 2. 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 3. Moderate stenosis of the P2 segment of the left PCA. Aortic Atherosclerosis (ICD10-I70.0). Electronically Signed   By: Deatra Robinson M.D.   On: 04/19/2023 01:37   DG Chest 2 View Result Date: 04/18/2023 CLINICAL DATA:  Chest pain EXAM: CHEST - 2 VIEW COMPARISON:  X-ray 09/26/2019. FINDINGS: Borderline cardiopericardial silhouette with Edric Fetterman calcified aorta. Slight prominence of the central vasculature. No pneumothorax, effusion or consolidation. No edema. Degenerative changes along the spine. IMPRESSION: Borderline size heart with some prominent central vasculature. Underinflation Electronically Signed   By: Karen Kays M.D.   On: 04/18/2023 13:47    Microbiology: No results found for this or any previous visit (from the past 240 hours).   Labs: Basic Metabolic Panel: Recent Labs  Lab 04/18/23 1154 04/19/23 1452 04/20/23 0438  NA 143 140 140  K 3.6 3.9 3.4*  CL 108 103 105  CO2 26 28 27   GLUCOSE 133* 123* 104*  BUN 20 15 22   CREATININE 0.97 1.20* 0.89  CALCIUM 9.2 9.3 8.8*  MG  --   --  1.9  PHOS  --   --  3.8   Liver Function Tests: Recent Labs  Lab 04/19/23 1452 04/20/23 0438  AST 23 13*   ALT 16 14  ALKPHOS 76 64  BILITOT 1.3* 1.0  PROT 7.3 6.3*  ALBUMIN 3.5 3.1*   No results for input(s): "LIPASE", "AMYLASE" in the last 168 hours. No results for input(s): "AMMONIA" in the last 168 hours. CBC: Recent Labs  Lab 04/18/23 1154 04/19/23 1452 04/20/23 0438  WBC 5.0 5.1 5.3  NEUTROABS  --  3.2 2.5  HGB 11.8* 12.9 12.3  HCT 38.0 40.3 38.5  MCV 88.8 86.7 86.9  PLT 201 168 203   Cardiac Enzymes: No results for input(s): "CKTOTAL", "CKMB", "CKMBINDEX", "TROPONINI" in the last 168 hours. BNP: BNP (last 3 results) Recent Labs    04/19/23 0811  BNP 25.0    ProBNP (last 3 results) No results for input(s): "PROBNP" in the last 8760 hours.  CBG: Recent Labs  Lab 04/19/23 1214 04/19/23 1623 04/19/23 2050 04/20/23 0742 04/20/23 1152  GLUCAP 87 89 111* 96 94       Signed:  Lacretia Nicks MD.  [facility] 04/20/2023, 4:18 PM

## 2023-04-20 NOTE — Care Management Obs Status (Signed)
MEDICARE OBSERVATION STATUS NOTIFICATION   Patient Details  Name: TAMRAH PINKETT MRN: [id number] Date of Birth: [birth year]   Medicare Observation Status Notification Given:  Yes    Oler, Tuszynski, RN 04/20/2023, 3:24 PM

## 2023-04-27 ENCOUNTER — Encounter: Payer: Self-pay | Admitting: Neurology

## 2023-05-28 ENCOUNTER — Encounter: Payer: Self-pay | Admitting: Neurology

## 2023-05-28 ENCOUNTER — Ambulatory Visit (INDEPENDENT_AMBULATORY_CARE_PROVIDER_SITE_OTHER): Payer: Medicare PPO | Admitting: Neurology

## 2023-05-28 VITALS — BP 165/71 | HR 93 | Ht 60.0 in | Wt 221.0 lb

## 2023-05-28 DIAGNOSIS — M5412 Radiculopathy, cervical region: Secondary | ICD-10-CM

## 2023-05-28 MED ORDER — DIAZEPAM 5 MG PO TABS: ORAL_TABLET | ORAL | 0 refills | Status: AC

## 2023-05-28 MED ORDER — GABAPENTIN 100 MG PO CAPS: ORAL_CAPSULE | ORAL | 3 refills | Status: AC

## 2023-05-28 NOTE — Patient Instructions (Signed)
MRI cervical spine without contrast
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
## 2023-06-06 NOTE — Progress Notes (Signed)
 Called patients insurance [employer] and spoke to [place] and was informed CPT [phone] needs a prior authorization from [employer]. Call [phone].  Printed and completed PA form. Pending provider signature then will fax.

## 2023-06-28 NOTE — Progress Notes (Signed)
 PA approved Authorization#:[id number]. Approved from 06/08/23-09/05/23.  Called patient and informed her that PA has been approved and provided her with [facility] scheduling phone number so she may call to get scheduled. Patient thanked me for the call and will get her MRI scheduled.

## 2023-07-14 DIAGNOSIS — R200 Anesthesia of skin: Secondary | ICD-10-CM

## 2023-11-22 NOTE — Progress Notes (Signed)
 Chart reviewed complete for [employer] forms project.  Form submitted for AWV DOS 11/13/23.

## 2024-01-18 ENCOUNTER — Ambulatory Visit (INDEPENDENT_AMBULATORY_CARE_PROVIDER_SITE_OTHER): Admitting: Otolaryngology

## 2024-01-18 ENCOUNTER — Telehealth (HOSPITAL_COMMUNITY): Payer: Self-pay | Admitting: *Deleted

## 2024-01-18 ENCOUNTER — Encounter (INDEPENDENT_AMBULATORY_CARE_PROVIDER_SITE_OTHER): Payer: Self-pay | Admitting: Otolaryngology

## 2024-01-18 VITALS — BP 180/74 | HR 68 | Temp 98.2°F | Ht 60.0 in | Wt 220.0 lb

## 2024-01-18 DIAGNOSIS — K219 Gastro-esophageal reflux disease without esophagitis: Secondary | ICD-10-CM

## 2024-01-18 DIAGNOSIS — R0982 Postnasal drip: Secondary | ICD-10-CM | POA: Diagnosis not present

## 2024-01-18 DIAGNOSIS — J3089 Other allergic rhinitis: Secondary | ICD-10-CM

## 2024-01-18 DIAGNOSIS — R0981 Nasal congestion: Secondary | ICD-10-CM | POA: Diagnosis not present

## 2024-01-18 DIAGNOSIS — R1310 Dysphagia, unspecified: Secondary | ICD-10-CM | POA: Diagnosis not present

## 2024-01-18 MED ORDER — SALINE SPRAY 0.65 % NA SOLN: 1.0000 | NASAL | 5 refills | Status: AC | PRN

## 2024-01-18 MED ORDER — FLUTICASONE PROPIONATE 50 MCG/ACT NA SUSP: 2.0000 | Freq: Every day | NASAL | 6 refills | Status: AC

## 2024-01-18 MED ORDER — FAMOTIDINE 20 MG PO TABS: 20.0000 mg | ORAL_TABLET | Freq: Two times a day (BID) | ORAL | 1 refills | Status: DC

## 2024-01-18 MED ORDER — LEVOCETIRIZINE DIHYDROCHLORIDE 5 MG PO TABS: 5.0000 mg | ORAL_TABLET | Freq: Every evening | ORAL | 3 refills | Status: AC

## 2024-01-18 NOTE — Patient Instructions (Signed)

## 2024-01-18 NOTE — Telephone Encounter (Signed)
 Attempted to contact patient to schedule OP MBS. Unable to leave message - VM unavailable @ [phone]. RKEEL
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
## 2024-01-25 ENCOUNTER — Other Ambulatory Visit (HOSPITAL_COMMUNITY): Payer: Self-pay | Admitting: *Deleted

## 2024-01-25 DIAGNOSIS — R1310 Dysphagia, unspecified: Secondary | ICD-10-CM

## 2024-01-27 ENCOUNTER — Other Ambulatory Visit (INDEPENDENT_AMBULATORY_CARE_PROVIDER_SITE_OTHER): Payer: Self-pay | Admitting: Otolaryngology

## 2024-02-05 ENCOUNTER — Ambulatory Visit (HOSPITAL_COMMUNITY)
Admission: RE | Admit: 2024-02-05 | Discharge: 2024-02-05 | Disposition: A | Source: Ambulatory Visit | Attending: Family Medicine | Admitting: Family Medicine

## 2024-02-05 ENCOUNTER — Ambulatory Visit (HOSPITAL_COMMUNITY)
Admission: RE | Admit: 2024-02-05 | Discharge: 2024-02-05 | Disposition: A | Discharge status: Home / Self Care | Source: Ambulatory Visit | Attending: Family Medicine | Admitting: Family Medicine

## 2024-02-05 ENCOUNTER — Ambulatory Visit (HOSPITAL_COMMUNITY)
Admission: RE | Admit: 2024-02-05 | Discharge: 2024-02-05 | Disposition: A | Discharge status: Home / Self Care | Source: Ambulatory Visit | Attending: Otolaryngology | Admitting: Otolaryngology

## 2024-02-05 DIAGNOSIS — R1310 Dysphagia, unspecified: Secondary | ICD-10-CM

## 2024-02-05 NOTE — Evaluation (Signed)
 Modified Barium Swallow Study  Patient Details  Name: Tara Summers MRN: [id number] Date of Birth: 02-Feb-1951  Today's Date: 02/05/2024  Modified Barium Swallow completed.  Full report located under Chart Review in the Imaging Section.  History of Present Illness Tara Summers is a 73 y.o. female who was referred for OP MBS by Dr. Liuba Soldatova.  Pt with self-reported pain upon swallowing; globus. 9/19: Flexible fiberoptic laryngoscopy The base of tongue was visualized and was normal. There were no signs of pooling of secretions in the piriform sinuses. The true vocal folds were mobile bilaterally. There were no signs of glottic or supraglottic mucosal lesion or mass. There was moderate interarytenoid pachydermia and post cricoid edema.  Dx GERD LPR. PMHx chronic HFpEF, hypertrophic nonobstructive cardiomyopathy, CAD, hypertension, type 2 diabetes, obesity, chronic bilateral lower extremity edema/lymphedema, history of breast cancer, GERD, arthritis, sleep apnea   Clinical Impression Pt presents with normal oropharyngeal function with adequate oral control and propulsion, normal pharyngeal squeeze, normal laryngeal vestibule closure, no aspiration.  A13 mm barium pill lodged briefly at the LES.  Pt viewed study in real time and we discussed normal findings.  Recommend she continue regular solids, thin liquids;  No SLP f/u is needed.  Factors that may increase risk of adverse event in presence of aspiration Tara Summers & Tara Summers 2021):   none Swallow Evaluation Recommendations Recommendations: PO diet PO Diet Recommendation: Regular;Thin liquids (Level 0) Liquid Administration via: Cup;Straw Medication Administration: Whole meds with liquid Supervision: Patient able to self-feed Oral care recommendations: Oral care BID (2x/day)    Tara Wiens L. Vona, MA CCC/SLP Clinical Specialist - Acute Care SLP Acute Rehabilitation Services Office number [phone]   Tara Summers  Laurice 02/05/2024,1:27 PM

## 2024-02-12 ENCOUNTER — Encounter: Payer: Self-pay | Admitting: Allergy & Immunology

## 2024-02-12 ENCOUNTER — Other Ambulatory Visit: Payer: Self-pay

## 2024-02-12 ENCOUNTER — Ambulatory Visit (INDEPENDENT_AMBULATORY_CARE_PROVIDER_SITE_OTHER): Admitting: Allergy & Immunology

## 2024-02-12 VITALS — BP 130/72 | HR 68 | Temp 98.0°F | Resp 18 | Ht 59.06 in | Wt 219.7 lb

## 2024-02-12 DIAGNOSIS — J189 Pneumonia, unspecified organism: Secondary | ICD-10-CM

## 2024-02-12 DIAGNOSIS — J310 Chronic rhinitis: Secondary | ICD-10-CM | POA: Diagnosis not present
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
## 2024-02-12 NOTE — Patient Instructions (Addendum)
 1. Chronic rhinitis - Because of insurance stipulations, we cannot do skin testing on the same day as your first visit. - We are all working to fight this, but for now we need to do two separate visits.  - We will know more after we do testing at the next visit.  - The skin testing visit can be squeezed in at your convenience.  - Then we can make a more full plan to address all of your symptoms. - Be sure to stop your antihistamines for 3 days before this appointment.   2. Recurrent pneumonia - Make sure you have gotten your most up to date pneumonia vaccine. - We might consider doing an immune workup at the next visit.  - This involves blood work.   3. Return in about 1 week (around 02/19/2024) for SKIN TESTING (1-55). You can have the follow up appointment with Dr. Iva or a Nurse Practicioner (our Nurse Practitioners are excellent and always have Physician oversight!).    Please inform us  of any Emergency Department visits, hospitalizations, or changes in symptoms. Call us  before going to the ED for breathing or allergy symptoms since we might be able to fit you in for a sick visit. Feel free to contact us  anytime with any questions, problems, or concerns.  It was a pleasure to meet you today!  Websites that have reliable patient information: 1. American Academy of Asthma, Allergy, and Immunology: www.aaaai.org 2. Food Allergy Research and Education (FARE): foodallergy.org 3. Mothers of Asthmatics: http://www.asthmacommunitynetwork.org 4. American College of Allergy, Asthma, and Immunology: www.acaai.org      "Like" us  on Facebook and Instagram for our latest updates!      A healthy democracy works best when [employer] participate! Make sure you are registered to vote! If you have moved or changed any of your contact information, you will need to get this updated before voting! Scan the QR codes below to learn more!

## 2024-02-19 ENCOUNTER — Telehealth (INDEPENDENT_AMBULATORY_CARE_PROVIDER_SITE_OTHER): Payer: Self-pay

## 2024-02-19 NOTE — Patient Instructions (Incomplete)
 1. Chronic rhinitis - Skin testing today is  - Copy of skin test given  2. Recurrent pneumonia - Make sure you have gotten your most up to date pneumonia vaccine. - We might consider doing an immune workup at the next visit.  - This involves blood work.   3. Follow up in weeks or sooner if needed   Please inform us  of any Emergency Department visits, hospitalizations, or changes in symptoms. Call us  before going to the ED for breathing or allergy symptoms since we might be able to fit you in for a sick visit. Feel free to contact us  anytime with any questions, problems, or concerns.  It was a pleasure to meet you today!  Websites that have reliable patient information: 1. American Academy of Asthma, Allergy, and Immunology: www.aaaai.org 2. Food Allergy Research and Education (FARE): foodallergy.org 3. Mothers of Asthmatics: http://www.asthmacommunitynetwork.org 4. American College of Allergy, Asthma, and Immunology: www.acaai.org

## 2024-02-19 NOTE — Telephone Encounter (Signed)
 Tied to call regard her appt that she has schedule for 04/17/24 . Pt didn't have a vm couldn't leave a vm.

## 2024-02-20 ENCOUNTER — Ambulatory Visit: Admitting: Family

## 2024-02-23 ENCOUNTER — Encounter (HOSPITAL_BASED_OUTPATIENT_CLINIC_OR_DEPARTMENT_OTHER): Payer: Self-pay

## 2024-02-23 ENCOUNTER — Emergency Department (HOSPITAL_BASED_OUTPATIENT_CLINIC_OR_DEPARTMENT_OTHER): Admission: EM | Admit: 2024-02-23 | Discharge: 2024-02-23 | Disposition: A | Discharge status: Home / Self Care

## 2024-02-23 ENCOUNTER — Emergency Department (HOSPITAL_BASED_OUTPATIENT_CLINIC_OR_DEPARTMENT_OTHER)

## 2024-02-23 DIAGNOSIS — Z853 Personal history of malignant neoplasm of breast: Secondary | ICD-10-CM | POA: Diagnosis not present

## 2024-02-23 DIAGNOSIS — R1030 Lower abdominal pain, unspecified: Secondary | ICD-10-CM | POA: Diagnosis present

## 2024-02-23 DIAGNOSIS — Z7984 Long term (current) use of oral hypoglycemic drugs: Secondary | ICD-10-CM | POA: Insufficient documentation

## 2024-02-23 DIAGNOSIS — Z7982 Long term (current) use of aspirin: Secondary | ICD-10-CM | POA: Insufficient documentation

## 2024-02-23 DIAGNOSIS — N3090 Cystitis, unspecified without hematuria: Secondary | ICD-10-CM | POA: Diagnosis not present

## 2024-02-23 DIAGNOSIS — I110 Hypertensive heart disease with heart failure: Secondary | ICD-10-CM | POA: Diagnosis not present

## 2024-02-23 DIAGNOSIS — I509 Heart failure, unspecified: Secondary | ICD-10-CM | POA: Diagnosis not present

## 2024-02-23 DIAGNOSIS — E119 Type 2 diabetes mellitus without complications: Secondary | ICD-10-CM | POA: Diagnosis not present

## 2024-02-23 DIAGNOSIS — Z7902 Long term (current) use of antithrombotics/antiplatelets: Secondary | ICD-10-CM | POA: Diagnosis not present

## 2024-02-23 DIAGNOSIS — Z79899 Other long term (current) drug therapy: Secondary | ICD-10-CM | POA: Diagnosis not present

## 2024-02-23 LAB — LIPASE, BLOOD: Lipase: 36 U/L (ref 11–51)

## 2024-02-23 LAB — COMPREHENSIVE METABOLIC PANEL WITH GFR
ALT: 12 U/L (ref 0–44)
AST: 18 U/L (ref 15–41)
Albumin: 3.9 g/dL (ref 3.5–5.0)
Alkaline Phosphatase: 90 U/L (ref 38–126)
Anion gap: 12 (ref 5–15)
BUN: 13 mg/dL (ref 8–23)
CO2: 24 mmol/L (ref 22–32)
Calcium: 9.3 mg/dL (ref 8.9–10.3)
Chloride: 106 mmol/L (ref 98–111)
Creatinine, Ser: 0.8 mg/dL (ref 0.44–1.00)
GFR, Estimated: >60 mL/min (ref >60)
Glucose, Bld: 139 mg/dL — ABNORMAL HIGH (ref 70–99)
Potassium: 3.5 mmol/L (ref 3.5–5.1)
Sodium: 142 mmol/L (ref 135–145)
Total Bilirubin: 0.8 mg/dL (ref 0.0–1.2)
Total Protein: 7.2 g/dL (ref 6.5–8.1)

## 2024-02-23 LAB — CBC
HCT: 39.5 % (ref 36.0–46.0)
Hemoglobin: 12.8 g/dL (ref 12.0–15.0)
MCH: 28.1 pg (ref 26.0–34.0)
MCHC: 32.4 g/dL (ref 30.0–36.0)
MCV: 86.8 fL (ref 80.0–100.0)
Platelets: 204 K/uL (ref 150–400)
RBC: 4.55 MIL/uL (ref 3.87–5.11)
RDW: 12.2 % (ref 11.5–15.5)
WBC: 4.9 K/uL (ref 4.0–10.5)
nRBC: 0 % (ref 0.0–0.2)

## 2024-02-23 LAB — URINALYSIS, MICROSCOPIC (REFLEX)
RBC / HPF: NONE SEEN RBC/hpf (ref 0–5)
WBC, UA: NONE SEEN WBC/hpf (ref 0–5)

## 2024-02-23 LAB — URINALYSIS, ROUTINE W REFLEX MICROSCOPIC
Bilirubin Urine: NEGATIVE
Glucose, UA: >=500 mg/dL — AB
Hgb urine dipstick: NEGATIVE
Ketones, ur: NEGATIVE mg/dL
Leukocytes,Ua: NEGATIVE
Nitrite: NEGATIVE
Protein, ur: 100 mg/dL — AB
Specific Gravity, Urine: 1.015 (ref 1.005–1.030)
pH: 6.5 (ref 5.0–8.0)

## 2024-02-23 LAB — TROPONIN T, HIGH SENSITIVITY: Troponin T High Sensitivity: 15 ng/L (ref 0–19)

## 2024-02-23 MED ORDER — IOHEXOL 300 MG/ML  SOLN
100.0000 mL | Freq: Once | INTRAMUSCULAR | Status: AC | PRN
  Administered 2024-02-23: 100 mL via INTRAVENOUS

## 2024-02-23 MED ORDER — CEPHALEXIN 500 MG PO CAPS: 500.0000 mg | ORAL_CAPSULE | Freq: Two times a day (BID) | ORAL | 0 refills | Status: AC

## 2024-02-23 MED ORDER — CEPHALEXIN 250 MG PO CAPS
500.0000 mg | ORAL_CAPSULE | Freq: Once | ORAL | Status: AC
  Administered 2024-02-23: 500 mg via ORAL
  Filled 2024-02-23: qty 2

## 2024-02-23 NOTE — ED Notes (Signed)
Lab notified of urine culture add on 

## 2024-02-23 NOTE — ED Provider Notes (Signed)
  EMERGENCY DEPARTMENT AT [facility] Provider Note   CSN: [id number] Arrival date & time: 02/23/24  1049     Patient presents with: Abdominal Pain   Tara Summers is a 73 y.o. female with history of breast cancer, type 2 diabetes, hypertension, HFpEF, presents with concern for lower abdominal pain ongoing for the past 3 days.  Reports pain is somewhat intermittent and does radiate into the sides of her abdomen.  She reports nausea, but no vomiting.  No diarrhea or constipation.  She reports having 2-3 normal bowel movements per day.  Denies any dysuria, hematuria, increased frequency.  She does report that her urine appears somewhat foamy.  She denies any chest pain or shortness of breath.  She reports that pain does not seem to get better with food intake or movement.  She reports previous abdominal surgeries of hernia repair, hysterectomy, and cholecystectomy    Abdominal Pain      Prior to Admission medications   Medication Sig Start Date End Date Taking? Authorizing Provider  cephALEXin (KEFLEX) 500 MG capsule Take 1 capsule (500 mg total) by mouth 2 (two) times daily for 5 days. 02/23/24 02/28/24 Yes Veta Palma, PA-C  amLODipine  (NORVASC ) 10 MG tablet Take 10 mg by mouth daily.    [provider]  aspirin  EC 81 MG tablet Take 81 mg by mouth once a week.    [provider]  atorvastatin  (LIPITOR) 40 MG tablet Take 1 tablet (40 mg total) by mouth daily. 07/23/19   Claudene Pacific, MD  clopidogrel  (PLAVIX ) 75 MG tablet Take 1 tablet (75 mg total) by mouth daily. 07/22/19   Claudene Pacific, MD  CONTOUR TEST test strip EVERY DAY 02/08/18   Kassie Mallick, MD  diazepam  (VALIUM ) 5 MG tablet Take 1 tablet 30-min prior to MRI. Patient not taking: Reported on 02/12/2024 05/28/23   Patel, Donika K, DO  famotidine  (PEPCID ) 20 MG tablet TAKE 1 TABLET BY MOUTH TWICE A DAY Patient not taking: Reported on 02/12/2024 01/28/24   Soldatova, Liuba, MD   FARXIGA 10 MG TABS tablet Take 10 mg by mouth daily. 03/30/23   [provider]  fluticasone  (FLONASE ) 50 MCG/ACT nasal spray Place 2 sprays into both nostrils daily. 01/18/24   Soldatova, Liuba, MD  gabapentin  (NEURONTIN ) 100 MG capsule Take 1 tablet at bedtime x 1 week, then increase to 200mg  at bedtime. 05/28/23   Patel, Donika K, DO  hydrALAZINE  (APRESOLINE ) 25 MG tablet Take 2 tablets (50 mg total) by mouth 2 (two) times daily. 04/20/23   Perri DELENA Meliton Mickey., MD  isosorbide mononitrate (IMDUR) 30 MG 24 hr tablet Take 30 mg by mouth in the morning and at bedtime.    [provider]  levocetirizine (XYZAL  ALLERGY 24HR) 5 MG tablet Take 1 tablet (5 mg total) by mouth every evening. 01/18/24   Soldatova, Liuba, MD  losartan  (COZAAR ) 100 MG tablet Take 1 tablet (100 mg total) by mouth daily. 07/23/19   Claudene Pacific, MD  metoprolol  succinate (TOPROL -XL) 50 MG 24 hr tablet Take 50 mg by mouth daily. 02/15/18   [provider]  OZEMPIC, 0.25 OR 0.5 MG/DOSE, 2 MG/3ML SOPN Inject 0.5 mg into the skin once a week.    [provider]  polyethylene glycol (MIRALAX  / GLYCOLAX ) packet Take 17 g by mouth daily as needed for moderate constipation.  Patient not taking: Reported on 02/12/2024    [provider]  potassium chloride  20 MEQ TBCR Take 20 mEq  by mouth 2 (two) times daily. 07/22/19   Claudene Pacific, MD  sodium chloride  (OCEAN) 0.65 % SOLN nasal spray Place 1 spray into both nostrils as needed. 01/18/24   Soldatova, Liuba, MD  torsemide  (DEMADEX ) 20 MG tablet Take 1 tablet (20 mg total) by mouth 2 (two) times daily. Patient not taking: Reported on 02/12/2024 07/22/19   Claudene Pacific, MD  cloNIDine (CATAPRES) 0.1 MG tablet Take 0.1 mg by mouth 2 (two) times daily.    08/04/19  [provider]    Allergies: Patient has no known allergies.    Review of Systems  Gastrointestinal:  Positive for abdominal pain.    Updated Vital Signs BP (!) 149/63 (BP  Location: Right Arm)   Pulse 82   Temp 97.9 F (36.6 C)   Resp 18   Ht 5' (1.524 m)   Wt 99.3 kg   SpO2 97%   BMI 42.77 kg/m   Physical Exam Vitals and nursing note reviewed.  Constitutional:      General: She is not in acute distress.    Appearance: She is well-developed.  HENT:     Head: Normocephalic and atraumatic.  Eyes:     Conjunctiva/sclera: Conjunctivae normal.  Cardiovascular:     Rate and Rhythm: Normal rate and regular rhythm.     Heart sounds: No murmur heard. Pulmonary:     Effort: Pulmonary effort is normal. No respiratory distress.     Breath sounds: Normal breath sounds.  Abdominal:     Palpations: Abdomen is soft.     Tenderness: There is no abdominal tenderness.     Comments: Patient holding lower abdomen, but not appreciate any significant abdominal tenderness to palpation on exam.  No CVA tenderness bilaterally  Musculoskeletal:        General: No swelling.     Cervical back: Neck supple.  Skin:    General: Skin is warm and dry.     Capillary Refill: Capillary refill takes less than 2 seconds.  Neurological:     Mental Status: She is alert.  Psychiatric:        Mood and Affect: Mood normal.     (all labs ordered are listed, but only abnormal results are displayed) Labs Reviewed  COMPREHENSIVE METABOLIC PANEL WITH GFR - Abnormal; Notable for the following components:      Result Value   Glucose, Bld 139 (*)    All other components within normal limits  URINALYSIS, ROUTINE W REFLEX MICROSCOPIC - Abnormal; Notable for the following components:   Glucose, UA >=500 (*)    Protein, ur 100 (*)    All other components within normal limits  URINALYSIS, MICROSCOPIC (REFLEX) - Abnormal; Notable for the following components:   Bacteria, UA RARE (*)    All other components within normal limits  URINE CULTURE  LIPASE, BLOOD  CBC  TROPONIN T, HIGH SENSITIVITY    EKG: EKG Interpretation Date/Time:  Saturday February 23 2024 11:47:05  EDT Ventricular Rate:  68 PR Interval:  177 QRS Duration:  80 QT Interval:  400 QTC Calculation: 426 R Axis:   -8  Text Interpretation: Sinus rhythm Low voltage, precordial leads Borderline T abnormalities, inferior leads Nonspecific ST abnormality Confirmed by Ula Barter [phone]) on 02/23/2024 11:48:31 AM  Radiology: CT ABDOMEN PELVIS W CONTRAST Result Date: 02/23/2024 CLINICAL DATA:  Lower abdominal pain.  Nausea last night. EXAM: CT ABDOMEN AND PELVIS WITH CONTRAST TECHNIQUE: Multidetector CT imaging of the abdomen and pelvis was performed using the standard protocol  following bolus administration of intravenous contrast. RADIATION DOSE REDUCTION: This exam was performed according to the departmental dose-optimization program which includes automated exposure control, adjustment of the mA and/or kV according to patient size and/or use of iterative reconstruction technique. CONTRAST:  OMNIPAQUE  IOHEXOL  300 MG/ML  SOLN COMPARISON:  CT 03/21/2023 FINDINGS: Lower chest: Calcified left hilar lymph nodes. Small deep fluid collection in the right breast is likely postoperative seroma. Borderline cardiomegaly with coronary artery calcifications. No acute basilar airspace disease. Hepatobiliary: Stable area of focal fatty infiltration in the posterior right lobe of the liver adjacent to the IVC. No suspicious liver lesion. Clips in the gallbladder fossa postcholecystectomy. No biliary dilatation. Pancreas: No ductal dilatation or inflammation. Spleen: Normal in size without focal abnormality. Adrenals/Urinary Tract: No suspicious adrenal nodule. Slight left adrenal thickening is unchanged. No hydronephrosis or renal calculi. Tiny cyst in the left kidney. No further follow-up imaging is recommended. There is mild thickening about the dome of the bladder with faint perivesicular fat stranding. Stomach/Bowel: Few fluid-filled loops of small bowel in the lower abdomen and pelvis. No abnormal distension. No  obstruction. Moderate colonic stool burden. Scattered colonic diverticula without diverticulitis. The appendix is normal. Vascular/Lymphatic: Aortic atherosclerosis. No aneurysm. Portal vein is patent. No suspicious lymphadenopathy. Reproductive: Status post hysterectomy. No adnexal masses. Other: No ascites or free air. Sequela prior umbilical hernia repair. No recurrent hernia. Musculoskeletal: L4-L5 posterior fusion. There are no acute or suspicious osseous abnormalities. IMPRESSION: 1. Mild thickening about the dome of the bladder with faint perivesicular fat stranding, can be seen with cystitis. Recommend correlation with urinalysis. 2. Few fluid-filled loops of small bowel in the lower abdomen and pelvis, potentially enteritis. No obstruction. 3. Colonic diverticulosis without diverticulitis. Aortic Atherosclerosis (ICD10-I70.0). Electronically Signed   By: Andrea Gasman M.D.   On: 02/23/2024 13:11     Procedures   Medications Ordered in the ED  cephALEXin (KEFLEX) capsule 500 mg (has no administration in time range)  iohexol  (OMNIPAQUE ) 300 MG/ML solution 100 mL (100 mLs Intravenous Contrast Given 02/23/24 1245)                                    Medical Decision Making Amount and/or Complexity of Data Reviewed Labs: ordered. Radiology: ordered.  Risk Prescription drug management.     Differential diagnosis includes but is not limited to ACS, peptic ulcer, gastritis, gastroenteritis, appendicitis, IBS, IBD, DKA, nephrolithiasis, UTI, pyelonephritis, pancreatitis, diverticulitis, mesenteric ischemia, abdominal aortic aneurysm, small bowel obstruction, volvulus   ED Course:  Upon initial evaluation, patient is well-appearing, no acute distress.  Stable vitals.  Reporting lower abdominal pain for the past 2 days.  On exam, no significant abdominal tenderness appreciated. She declines any pain medicine or nausea medicine at this time  Labs Ordered: I Ordered, and personally  interpreted labs.  The pertinent results include:   CBC within normal limits, no leukocytosis CMP with elevated glucose at 139.  Otherwise within normal limits with normal LFTs, creatinine, and electrolytes Lipase within normal limits Urinalysis with large amount of glucose, no signs of infection Troponin within normal limits  Imaging Studies ordered: I ordered imaging studies including CT abdomen pelvis I independently visualized the imaging with scope of interpretation limited to determining acute life threatening conditions related to emergency care. Imaging showed  IMPRESSION:  1. Mild thickening about the dome of the bladder with faint  perivesicular fat stranding, can be seen with  cystitis. Recommend  correlation with urinalysis.  2. Few fluid-filled loops of small bowel in the lower abdomen and  pelvis, potentially enteritis. No obstruction.  3. Colonic diverticulosis without diverticulitis.    Aortic Atherosclerosis (ICD10-I70.0).   I agree with the radiologist interpretation   Cardiac Monitoring: / EKG: The patient was maintained on a cardiac monitor.  I personally viewed and interpreted the cardiac monitored which showed an underlying rhythm of: Normal sinus rhythm   Medications Given: Keflex   Upon re-evaluation, patient remains well-appearing with stable vitals.  We discussed that her did show some thickening of the gallbladder wall which is concerning for cystitis given her suprapubic comfort.  Although her urine did not seem clearly infected as it was negative for nitrates and leukocytes, but there were bacteria noted on the microscopic reflex.  I discussed this with my attending Dr. Ula, and he recommends treating the patient with course of Keflex for potential UTI.  Will have patient follow-up with urology if symptoms do not improve with the Keflex course.  CT also noted possible enteritis, but patient not have any diarrhea or vomiting, no significant abdominal  tenderness, do not feel this needs any further intervention at this time.  Low concern for ACS given no chest pain, shortness of breath, normal troponin, and EKG with normal sinus rhythm.  Patient's other labs including CBC, CMP, and lipase were within normal limits.  No signs of systemic infection.  Low concern for other emergent etiology at this time.  Patient stable and appropriate for discharge home.   Impression: Acute cystitis  Disposition:  The patient was discharged home with instructions to take 5-day course of Keflex as prescribed.  Follow-up with urology if symptoms not improved by the end of the Keflex course.  Urology contact information was provided and patient understands she would need to call to schedule this appointment.  I instructed patient to notify her PCP of the finding of gallbladder wall thickening on her CT scan. Return precautions given and patient verbalized understanding.   This chart was dictated using voice recognition software, Dragon. Despite the best efforts of this provider to proofread and correct errors, errors may still occur which can change documentation meaning.       Final diagnoses:  Cystitis    ED Discharge Orders          Ordered    cephALEXin (KEFLEX) 500 MG capsule  2 times daily        02/23/24 1404               Veta Palma, PA-C 02/23/24 1420    Ula Prentice SAUNDERS, MD 02/23/24 1442

## 2024-02-23 NOTE — Discharge Instructions (Addendum)
 On your CT scan today, it appeared that you had some thickening of your gallbladder wall which could indicate a urinary tract infection.  I prescribed you an antibiotic called Keflex to treat for potential urinary tract infection.  Please take this twice daily for the next 5 days.  You were given your first dose here today.  You may take your next dose later this evening.  Taking the full course of antibiotics even if you start feeling better.  If you do not have resolution of your symptoms with the full antibiotic course, please follow-up with the urology office listed below for further evaluation of the bladder wall thickening noted on your CT scan. Please also notify your PCP of this finding.  The remainder of your labs are reassuring.  Your kidney, liver, and pancreas labs are normal.  Your blood counts and electrolytes were normal. Your heart testing did not show any signs of a heart attack.  Please return to the ER if you have severe worsening of pain, fevers, persistent vomiting, any other concerning symptoms

## 2024-02-23 NOTE — ED Triage Notes (Signed)
 Pt with lower abdominal pain that radiates to her sides and buttocks. The pain started Thursday morning, no known injury. No vomiting, diarrhea, or constipation. Did feel nauseated last night. Denies urinary urgency, frequency, or burning. Says it is discomfort and urine appears foamy.

## 2024-02-23 NOTE — ED Notes (Signed)
 Lab notified of troponin add-on.

## 2024-02-24 LAB — URINE CULTURE: Culture: <10000 — AB

## 2024-03-04 NOTE — Patient Instructions (Incomplete)
 1. Chronic rhinitis - Skin testing today is  - Copy of skin test given  2. Recurrent pneumonia - Make sure you have gotten your most up to date pneumonia vaccine. - We might consider doing an immune workup at the next visit.  - This involves blood work.   3. Follow up in weeks or sooner if needed   Please inform us  of any Emergency Department visits, hospitalizations, or changes in symptoms. Call us  before going to the ED for breathing or allergy symptoms since we might be able to fit you in for a sick visit. Feel free to contact us  anytime with any questions, problems, or concerns.  It was a pleasure to meet you today!  Websites that have reliable patient information: 1. American Academy of Asthma, Allergy, and Immunology: www.aaaai.org 2. Food Allergy Research and Education (FARE): foodallergy.org 3. Mothers of Asthmatics: http://www.asthmacommunitynetwork.org 4. American College of Allergy, Asthma, and Immunology: www.acaai.org

## 2024-03-05 ENCOUNTER — Ambulatory Visit: Admitting: Family

## 2024-03-10 NOTE — Patient Instructions (Incomplete)
 1. Chronic rhinitis - Skin testing today is  - Copy of skin test given  2. Recurrent pneumonia - Make sure you have gotten your most up to date pneumonia vaccine. - We might consider doing an immune workup at the next visit.  - This involves blood work.   3. Follow up in weeks or sooner if needed   Please inform us  of any Emergency Department visits, hospitalizations, or changes in symptoms. Call us  before going to the ED for breathing or allergy symptoms since we might be able to fit you in for a sick visit. Feel free to contact us  anytime with any questions, problems, or concerns.  It was a pleasure to meet you today!  Websites that have reliable patient information: 1. American Academy of Asthma, Allergy, and Immunology: www.aaaai.org 2. Food Allergy Research and Education (FARE): foodallergy.org 3. Mothers of Asthmatics: http://www.asthmacommunitynetwork.org 4. American College of Allergy, Asthma, and Immunology: www.acaai.org

## 2024-03-11 ENCOUNTER — Ambulatory Visit (INDEPENDENT_AMBULATORY_CARE_PROVIDER_SITE_OTHER): Admitting: Family

## 2024-03-11 ENCOUNTER — Encounter: Payer: Self-pay | Admitting: Family

## 2024-03-11 DIAGNOSIS — J302 Other seasonal allergic rhinitis: Secondary | ICD-10-CM | POA: Diagnosis not present

## 2024-03-11 DIAGNOSIS — J3089 Other allergic rhinitis: Secondary | ICD-10-CM

## 2024-03-11 NOTE — Progress Notes (Signed)
 Date of Service/Encounter:  03/11/24  Allergy testing appointment   Initial visit on 02/12/24, seen for chronic rhinitis and recurrent pneumonia.  Please see that note for additional details.  Today reports for allergy diagnostic testing:    DIAGNOSTICS:  Skin Testing: Environmental allergy panel. Adequate positive and negative controls Results discussed with patient/family.   Airborne Adult Perc - 03/11/24 0900     Time Antigen Placed 9070    Allergen Manufacturer Jestine    Location Back    Number of Test 55    Panel 1 Select    1. Control-Buffer 50% Glycerol Negative    2. Control-Histamine 4+    3. Bahia 3+    4. Bermuda 3+    5. Johnson 3+    6. [place]  Blue Negative    7. Meadow Fescue Negative    8. Perennial Rye Negative    9. Timothy Negative    10. Ragweed Mix Negative    11. Cocklebur Negative    12. Plantain,  English Negative    13. Baccharis 2+    14. Dog Fennel 2+    15. Russian Thistle 3+    16. Lamb's Quarters Negative    17. Sheep Sorrell 2+    18. Rough Pigweed 2+    19. Marsh Elder, Rough 2+    20. Mugwort, Common 2+    21. Box, Elder 2+    22. Cedar, red 2+    23. Sweet Gum 2+    24. Pecan Pollen 2+    25. Pine Mix 2+    26. Walnut, Black Pollen Negative    27. Red Mulberry Negative    28. Ash Mix Negative    29. Birch Mix 2+    30. Beech American Negative    31. Cottonwood, Eastern 2+    32. Hickory, White 2+    33. Maple Mix 2+    34. Oak, Eastern Mix 2+    35. Sycamore Eastern 2+    36. Alternaria Alternata 2+    37. Cladosporium Herbarum 2+    38. Aspergillus Mix 2+    39. Penicillium Mix 2+    40. Bipolaris Sorokiniana (Helminthosporium) Negative    41. Drechslera Spicifera (Curvularia) 2+    42. Mucor Plumbeus Negative    43. Fusarium Moniliforme Negative    44. Aureobasidium Pullulans (pullulara) Negative    45. Rhizopus Oryzae Negative    46. Botrytis Cinera Negative    47. Epicoccum Nigrum Negative    48. Phoma Betae  Negative    49. Dust Mite Mix Negative    50. Cat Hair 10,000 BAU/ml Negative    51.  Dog Epithelia Negative    52. Mixed Feathers Negative    53. Horse Epithelia Negative    54. Cockroach, German Negative    55. Tobacco Leaf Negative          Intradermal - 03/11/24 1100     Time Antigen Placed 1112    Allergen Manufacturer Jestine    Location Arm    Number of Test 7    Control Negative    7 Grass Negative    Mold 4 Negative    Mite Mix Negative    Cat Negative    Dog Negative    Cockroach Negative           Allergy testing results were read and interpreted by myself, documented by clinical staff.  Patient provided with copy of allergy testing along with avoidance measures when  indicated.  1. Allergic rhinitis - Skin testing today is positive to grass pollen, weed pollen, tree pollen, and mold.  Intradermal skin testing negative - Copy of skin test given - Start avoidance measures as below - Continue medications from Ear Nose and Throat. Bring those with you the day of your next office visit with us - - I will speak with Dr. Iva to see if you are a candidate for allergy injections due to being on a beta blocker and history of chronic heart failure with preserved ejection fraction  2. Recurrent pneumonia - Make sure you have gotten your most up to date pneumonia vaccine. - We might consider doing an immune workup at the next visit.  - This involves blood work.   3. Follow up in 4-6 weeks or sooner if needed   Please inform us  of any Emergency Department visits, hospitalizations, or changes in symptoms. Call us  before going to the ED for breathing or allergy symptoms since we might be able to fit you in for a sick visit. Feel free to contact us  anytime with any questions, problems, or concerns.  It was a pleasure to meet you today!  Websites that have reliable patient information: 1. American Academy of Asthma, Allergy, and Immunology: www.aaaai.org 2. Food  Allergy Research and Education (FARE): foodallergy.org 3. Mothers of Asthmatics: http://www.asthmacommunitynetwork.org 4. American College of Allergy, Asthma, and Immunology: www.acaai.org   Reducing Pollen Exposure The American Academy of Allergy, Asthma and Immunology suggests the following steps to reduce your exposure to pollen during allergy seasons. Do not hang sheets or clothing out to dry; pollen may collect on these items. Do not mow lawns or spend time around freshly cut grass; mowing stirs up pollen. Keep windows closed at night.  Keep car windows closed while driving. Minimize morning activities outdoors, a time when pollen counts are usually at their highest. Stay indoors as much as possible when pollen counts or humidity is high and on windy days when pollen tends to remain in the air longer. Use air conditioning when possible.  Many air conditioners have filters that trap the pollen spores. Use a HEPA room air filter to remove pollen form the indoor air you breathe.   Control of Mold Allergen Mold and fungi can grow on a variety of surfaces provided certain temperature and moisture conditions exist.  Outdoor molds grow on plants, decaying vegetation and soil.  The major outdoor mold, Alternaria and Cladosporium, are found in very high numbers during hot and dry conditions.  Generally, a late Summer - Fall peak is seen for common outdoor fungal spores.  Rain will temporarily lower outdoor mold spore count, but counts rise rapidly when the rainy period ends.  The most important indoor molds are Aspergillus and Penicillium.  Dark, humid and poorly ventilated basements are ideal sites for mold growth.  The next most common sites of mold growth are the bathroom and the kitchen.  Outdoor [employer] Use air conditioning and keep windows closed Avoid exposure to decaying vegetation. Avoid leaf raking. Avoid grain handling. Consider wearing a face mask if working in moldy  areas.  Indoor Mold Control Maintain humidity below 50%. Clean washable surfaces with 5% bleach solution. Remove sources e.g. Contaminated carpets.  Wanda Craze, FNP [facility] 

## 2024-03-11 NOTE — Progress Notes (Deleted)
  Date of Service/Encounter:  03/11/24  Allergy testing appointment   Initial visit on 02/12/24, seen for chronic rhinitis and recurrent pneumonia.  Please see that note for additional details.  Today reports for allergy diagnostic testing:    DIAGNOSTICS:  Skin Testing: Environmental allergy panel. Adequate positive and negative controls Results discussed with patient/family.   Allergy testing results were read and interpreted by myself, documented by clinical staff.  Patient provided with copy of allergy testing along with avoidance measures when indicated.   Wanda Craze, FNP [facility]

## 2024-03-18 ENCOUNTER — Encounter: Payer: Self-pay | Admitting: Urology

## 2024-03-18 ENCOUNTER — Ambulatory Visit: Admitting: Urology

## 2024-03-18 VITALS — BP 176/80 | HR 85 | Ht 60.0 in | Wt 216.0 lb

## 2024-03-18 DIAGNOSIS — R1030 Lower abdominal pain, unspecified: Secondary | ICD-10-CM | POA: Diagnosis not present

## 2024-03-18 DIAGNOSIS — Z87440 Personal history of urinary (tract) infections: Secondary | ICD-10-CM | POA: Diagnosis not present

## 2024-03-18 LAB — URINALYSIS, ROUTINE W REFLEX MICROSCOPIC
Bilirubin, UA: NEGATIVE
Ketones, UA: NEGATIVE
Leukocytes,UA: NEGATIVE
Nitrite, UA: NEGATIVE
RBC, UA: NEGATIVE
Specific Gravity, UA: 1.015 (ref 1.005–1.030)
Urobilinogen, Ur: 0.2 mg/dL (ref 0.2–1.0)
pH, UA: 5.5 (ref 5.0–7.5)

## 2024-03-18 LAB — MICROSCOPIC EXAMINATION: Bacteria, UA: NONE SEEN

## 2024-03-18 LAB — BLADDER SCAN AMB NON-IMAGING
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
## 2024-04-07 NOTE — Patient Instructions (Incomplete)
 1. Allergic rhinitis - Skin testing on 03/11/24 was positive to grass pollen, weed pollen, tree pollen, and mold.  Intradermal skin testing negative - Continue avoidance measures as below - Continue medications from Ear Nose and Throat. Bring those with you the day of your next office visit with us - -   2. Recurrent pneumonia - Make sure you have gotten your most up to date pneumonia vaccine. - We might consider doing an immune workup at the next visit.  - This involves blood work.   3. Follow up in months or sooner if needed   Please inform us  of any Emergency Department visits, hospitalizations, or changes in symptoms. Call us  before going to the ED for breathing or allergy  symptoms since we might be able to fit you in for a sick visit. Feel free to contact us  anytime with any questions, problems, or concerns.  It was a pleasure to  see meet you today!  Websites that have reliable patient information: 1. American Academy of Asthma, Allergy , and Immunology: www.aaaai.org 2. Food Allergy  Research and Education (FARE): foodallergy.org 3. Mothers of Asthmatics: http://www.asthmacommunitynetwork.org 4. American College of Allergy , Asthma, and Immunology: www.acaai.org   Reducing Pollen Exposure The American Academy of Allergy , Asthma and Immunology suggests the following steps to reduce your exposure to pollen during allergy  seasons. Do not hang sheets or clothing out to dry; pollen may collect on these items. Do not mow lawns or spend time around freshly cut grass; mowing stirs up pollen. Keep windows closed at night.  Keep car windows closed while driving. Minimize morning activities outdoors, a time when pollen counts are usually at their highest. Stay indoors as much as possible when pollen counts or humidity is high and on windy days when pollen tends to remain in the air longer. Use air conditioning when possible.  Many air conditioners have filters that trap the pollen  spores. Use a HEPA room air filter to remove pollen form the indoor air you breathe.   Control of Mold Allergen Mold and fungi can grow on a variety of surfaces provided certain temperature and moisture conditions exist.  Outdoor molds grow on plants, decaying vegetation and soil.  The major outdoor mold, Alternaria and Cladosporium, are found in very high numbers during hot and dry conditions.  Generally, a late Summer - Fall peak is seen for common outdoor fungal spores.  Rain will temporarily lower outdoor mold spore count, but counts rise rapidly when the rainy period ends.  The most important indoor molds are Aspergillus and Penicillium.  Dark, humid and poorly ventilated basements are ideal sites for mold growth.  The next most common sites of mold growth are the bathroom and the kitchen.  Outdoor [employer] Use air conditioning and keep windows closed Avoid exposure to decaying vegetation. Avoid leaf raking. Avoid grain handling. Consider wearing a face mask if working in moldy areas.  Indoor Mold Control Maintain humidity below 50%. Clean washable surfaces with 5% bleach solution. Remove sources e.g. Contaminated carpets.

## 2024-04-08 ENCOUNTER — Ambulatory Visit: Admitting: Family

## 2024-04-15 ENCOUNTER — Other Ambulatory Visit (INDEPENDENT_AMBULATORY_CARE_PROVIDER_SITE_OTHER): Payer: Self-pay | Admitting: Otolaryngology

## 2024-04-18 ENCOUNTER — Ambulatory Visit (INDEPENDENT_AMBULATORY_CARE_PROVIDER_SITE_OTHER): Admitting: Otolaryngology

## 2024-04-22 ENCOUNTER — Encounter: Payer: Self-pay | Admitting: Neurology

## 2024-04-22 ENCOUNTER — Ambulatory Visit: Admitting: Neurology

## 2024-04-22 VITALS — BP 142/82 | HR 79 | Ht 60.0 in | Wt 209.0 lb

## 2024-04-22 DIAGNOSIS — M5412 Radiculopathy, cervical region: Secondary | ICD-10-CM

## 2024-04-22 DIAGNOSIS — E1142 Type 2 diabetes mellitus with diabetic polyneuropathy: Secondary | ICD-10-CM

## 2024-04-22 DIAGNOSIS — G44219 Episodic tension-type headache, not intractable: Secondary | ICD-10-CM | POA: Diagnosis not present

## 2024-04-22 NOTE — Patient Instructions (Addendum)
 CT head and cervical spine

## 2024-04-22 NOTE — Progress Notes (Signed)
 "   Follow-up Visit   Date: 04/22/2024    Tara Summers MRN: [id number] DOB: November 18, 1950    Tara Summers is a 72 y.o. right-handed female with chronic HFpEF, cardiomyopathy, CAD, hypertension, type 2 diabetes mellitus, obesity, lymphedema, history of breast cancer, GERD, and OSA returning to the clinic for follow-up of left cervical radiculopathy.  The patient was accompanied to the clinic by self.   IMPRESSION/PLAN: Assessment & Plan New onset headache, likely stress-related tension headaches. No photophobia, phonophobia, nausea, or vomiting. Stress identified as a significant factor.  - Patient requesting CT head wo contrast  Cervical radiculopathy.  Persistent numbness in bilateral arms. Previous physical therapy not continued due to cost. She was unable to complete MRI due to claustrophobia.  - CT cervical spine wo contrast  Type 2 diabetes mellitus with diabetic neuropathy Gabapentin  effective for leg symptoms. No additional medication needed. - Continue gabapentin  100mg  at bedtime  Further recommendations pending results.   --------------------------------------------- History of present illness: Starting around early 2024, she began having numbness/tingling involving the left arm, which radiates from the neck into the upper arm. Pain is described as soreness.  She did PT which did help, but she was unable to continue due to cost of copay.  She has minimal symptoms in the right hand.  She has not tried any OTC medications.  She denies weakness.    She has neuropathy in the feet from diabetes which is mostly numbness.   UPDATE 04/22/2024: Discussed the use of AI scribe software for clinical note transcription with the patient, who gave verbal consent to proceed.  She has been experiencing a headache for approximately one month, described as an ache located at the top of her head. The headache initially lasted for a little over a week, with associated scalp tenderness  and soreness. She typically does not experience headaches, making this episode particularly noticeable. She used naproxen , which she usually takes for knee pain. The naproxen  helped her sleep and eased the headache pain slightly. No photophobia, phonophobia, nausea, vomiting, or exacerbation of the headache with coughing or sneezing. Currently, the headache has improved, although she still notices it occasionally, especially when wearing glasses.  She mentions a history of stress, particularly related to family issues and responsibilities at her church, which she believes may contribute to her headaches.   She experiences numbness and tingling in her arms, which she associates with neck issues. She attempted physical therapy but could not continue due to financial constraints. She reports occasional balance issues, feeling as though she might fall, but denies any actual falls.   She takes gabapentin  100mg  at bedtime for neuropathy which is effective.  Medications:  Medications Ordered Prior to Encounter[1]  Allergies: Allergies[2]  Vital Signs:  BP (!) 169/78   Pulse 79   Ht 5' (1.524 m)   Wt 209 lb (94.8 kg)   SpO2 95%   BMI 40.82 kg/m    Neurological Exam: MENTAL STATUS including orientation to time, place, person, recent and remote memory, attention span and concentration, language, and fund of knowledge is normal.  Speech is not dysarthric.  CRANIAL NERVES:    Pupils equal round and reactive to light.  Normal conjugate, extra-ocular eye movements in all directions of gaze.  No ptosis.  Face is symmetric.   MOTOR:  Motor strength is 5/5 in all extremities.  No atrophy, fasciculations or abnormal movements.  No pronator drift.  Tone is normal.    MSRs:  Reflexes are 2+/4 throughout,  except absent at the ankles.  COORDINATION/GAIT:  Normal finger-to- nose-finger.  Gait is mildly wide-based, unassisted.   Data: CTA head and neck 04/19/2023: 1. No emergent large vessel  occlusion. 2. 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 3. Moderate stenosis of the P2 segment of the left PCA.   Aortic Atherosclerosis (ICD10-I70.0).   CT lumbar spine 06/02/2022:  No acute lumbar spine fracture. Prior posterior decompression with anterior and posterior fusion at L4-L5. No evidence of hardware complication.   Multilevel degenerative changes as described above. Mild spinal canal stenosis at L3-L4. Moderate left and moderate-severe right neural foraminal stenosis at L3-L4. Moderate left and mild right neural foraminal stenosis at L2-L3.   Thank you for allowing me to participate in patient's care.  If I can answer any additional questions, I would be pleased to do so.    Sincerely,    Ileen Kahre K. Tobie, DO      [1]  Current Outpatient Medications on File Prior to Visit  Medication Sig Dispense Refill   amLODipine  (NORVASC ) 10 MG tablet Take 10 mg by mouth daily.     aspirin  EC 81 MG tablet Take 81 mg by mouth once a week.     atorvastatin  (LIPITOR) 40 MG tablet Take 1 tablet (40 mg total) by mouth daily. 30 tablet 3   clopidogrel  (PLAVIX ) 75 MG tablet Take 1 tablet (75 mg total) by mouth daily. 30 tablet 3   CONTOUR TEST test strip EVERY DAY 100 each 4   FARXIGA 10 MG TABS tablet Take 10 mg by mouth daily.     fluticasone  (FLONASE ) 50 MCG/ACT nasal spray Place 2 sprays into both nostrils daily. 16 g 6   gabapentin  (NEURONTIN ) 100 MG capsule Take 1 tablet at bedtime x 1 week, then increase to 200mg  at bedtime. (Patient taking differently: Take 100 mg by mouth daily. Take 1 tablet at bedtime) 60 capsule 3   hydrALAZINE  (APRESOLINE ) 25 MG tablet Take 2 tablets (50 mg total) by mouth 2 (two) times daily.     isosorbide mononitrate (IMDUR) 30 MG 24 hr tablet Take 30 mg by mouth in the morning and at bedtime.     levocetirizine (XYZAL  ALLERGY  24HR) 5 MG tablet Take 1 tablet (5 mg total) by mouth every evening. 30 tablet 3   losartan  (COZAAR ) 100  MG tablet Take 1 tablet (100 mg total) by mouth daily. 30 tablet 3   metoprolol  succinate (TOPROL -XL) 50 MG 24 hr tablet Take 50 mg by mouth daily.  1   OZEMPIC, 0.25 OR 0.5 MG/DOSE, 2 MG/3ML SOPN Inject 0.5 mg into the skin once a week.     polyethylene glycol (MIRALAX  / GLYCOLAX ) packet Take 17 g by mouth daily as needed for moderate constipation.      potassium chloride  20 MEQ TBCR Take 20 mEq by mouth 2 (two) times daily. 60 tablet 3   sodium chloride  (OCEAN) 0.65 % SOLN nasal spray Place 1 spray into both nostrils as needed. 30 mL 5   torsemide  (DEMADEX ) 20 MG tablet Take 1 tablet (20 mg total) by mouth 2 (two) times daily.     diazepam  (VALIUM ) 5 MG tablet Take 1 tablet 30-min prior to MRI. (Patient not taking: Reported on 03/18/2024) 2 tablet 0   famotidine  (PEPCID ) 20 MG tablet TAKE 1 TABLET BY MOUTH TWICE A DAY (Patient not taking: Reported on 03/18/2024) 180 tablet 1   [DISCONTINUED] cloNIDine (CATAPRES) 0.1 MG tablet Take 0.1 mg by mouth 2 (two) times  daily.       No current facility-administered medications on file prior to visit.  [2] No Known Allergies  "

## 2024-04-26 ENCOUNTER — Other Ambulatory Visit: Payer: Self-pay

## 2024-04-26 ENCOUNTER — Encounter (HOSPITAL_BASED_OUTPATIENT_CLINIC_OR_DEPARTMENT_OTHER): Payer: Self-pay

## 2024-04-26 ENCOUNTER — Emergency Department (HOSPITAL_BASED_OUTPATIENT_CLINIC_OR_DEPARTMENT_OTHER)

## 2024-04-26 DIAGNOSIS — R509 Fever, unspecified: Secondary | ICD-10-CM | POA: Insufficient documentation

## 2024-04-26 DIAGNOSIS — J029 Acute pharyngitis, unspecified: Secondary | ICD-10-CM | POA: Insufficient documentation

## 2024-04-26 DIAGNOSIS — J849 Interstitial pulmonary disease, unspecified: Secondary | ICD-10-CM | POA: Insufficient documentation

## 2024-04-26 DIAGNOSIS — I517 Cardiomegaly: Secondary | ICD-10-CM | POA: Insufficient documentation

## 2024-04-26 DIAGNOSIS — R059 Cough, unspecified: Secondary | ICD-10-CM | POA: Insufficient documentation

## 2024-04-26 DIAGNOSIS — R0981 Nasal congestion: Secondary | ICD-10-CM | POA: Insufficient documentation

## 2024-04-26 DIAGNOSIS — R52 Pain, unspecified: Secondary | ICD-10-CM | POA: Insufficient documentation

## 2024-04-26 DIAGNOSIS — R0781 Pleurodynia: Secondary | ICD-10-CM | POA: Insufficient documentation

## 2024-04-26 LAB — RESP PANEL BY RT-PCR (RSV, FLU A&B, COVID)  RVPGX2
Influenza A by PCR: NEGATIVE
Influenza B by PCR: NEGATIVE
Resp Syncytial Virus by PCR: NEGATIVE
SARS Coronavirus 2 by RT PCR: NEGATIVE

## 2024-04-26 LAB — CBC
HCT: 35.1 % — ABNORMAL LOW (ref 36.0–46.0)
Hemoglobin: 11.6 g/dL — ABNORMAL LOW (ref 12.0–15.0)
MCH: 28.1 pg (ref 26.0–34.0)
MCHC: 33 g/dL (ref 30.0–36.0)
MCV: 85 fL (ref 80.0–100.0)
Platelets: 231 K/uL (ref 150–400)
RBC: 4.13 MIL/uL (ref 3.87–5.11)
RDW: 12.8 % (ref 11.5–15.5)
WBC: 9.2 K/uL (ref 4.0–10.5)
nRBC: 0 % (ref 0.0–0.2)

## 2024-04-26 LAB — BASIC METABOLIC PANEL WITH GFR
Anion gap: 11 (ref 5–15)
BUN: 16 mg/dL (ref 8–23)
CO2: 27 mmol/L (ref 22–32)
Calcium: 9.4 mg/dL (ref 8.9–10.3)
Chloride: 100 mmol/L (ref 98–111)
Creatinine, Ser: 0.9 mg/dL (ref 0.44–1.00)
GFR, Estimated: >60 mL/min
Glucose, Bld: 120 mg/dL — ABNORMAL HIGH (ref 70–99)
Potassium: 3.7 mmol/L (ref 3.5–5.1)
Sodium: 138 mmol/L (ref 135–145)

## 2024-04-26 MED ORDER — ACETAMINOPHEN 325 MG PO TABS
650.0000 mg | ORAL_TABLET | Freq: Once | ORAL | Status: AC | PRN
  Administered 2024-04-26: 650 mg via ORAL
  Filled 2024-04-26: qty 2

## 2024-04-26 NOTE — ED Triage Notes (Addendum)
 Pt reports generalized body aches and nonproductive cough, onset yesterday. She reports it hurts to breathe. Denies chest pain, just states it hurts everywhere when I breathe. She doesn't feel like she can't catch her breath, just states pain everywhere from breathing normally. Denies fever/chills.

## 2024-04-27 ENCOUNTER — Emergency Department (HOSPITAL_BASED_OUTPATIENT_CLINIC_OR_DEPARTMENT_OTHER)
Admission: EM | Admit: 2024-04-27 | Discharge: 2024-04-27 | Disposition: A | Discharge status: Home / Self Care | Attending: Emergency Medicine | Admitting: Emergency Medicine

## 2024-04-27 DIAGNOSIS — J111 Influenza due to unidentified influenza virus with other respiratory manifestations: Secondary | ICD-10-CM

## 2024-04-27 LAB — LIPASE, BLOOD: Lipase: 32 U/L (ref 11–51)

## 2024-04-27 LAB — HEPATIC FUNCTION PANEL
ALT: 10 U/L (ref 0–44)
AST: 16 U/L (ref 15–41)
Albumin: 4.1 g/dL (ref 3.5–5.0)
Alkaline Phosphatase: 93 U/L (ref 38–126)
Bilirubin, Direct: 0.4 mg/dL — ABNORMAL HIGH (ref 0.0–0.2)
Indirect Bilirubin: 0.6 mg/dL (ref 0.3–0.9)
Total Bilirubin: 1 mg/dL (ref 0.0–1.2)
Total Protein: 7.3 g/dL (ref 6.5–8.1)

## 2024-04-27 LAB — TROPONIN T, HIGH SENSITIVITY: Troponin T High Sensitivity: <15 ng/L (ref 0–19)

## 2024-04-27 LAB — PRO BRAIN NATRIURETIC PEPTIDE: Pro Brain Natriuretic Peptide: 221 pg/mL

## 2024-04-27 MED ORDER — LACTATED RINGERS IV BOLUS
1000.0000 mL | Freq: Once | INTRAVENOUS | Status: AC
  Administered 2024-04-27: 1000 mL via INTRAVENOUS

## 2024-04-27 MED ORDER — KETOROLAC TROMETHAMINE 15 MG/ML IJ SOLN
15.0000 mg | Freq: Once | INTRAMUSCULAR | Status: AC
  Administered 2024-04-27: 15 mg via INTRAVENOUS
  Filled 2024-04-27: qty 1

## 2024-04-27 MED ORDER — IBUPROFEN 200 MG PO TABS: 200.0000 mg | ORAL_TABLET | Freq: Four times a day (QID) | ORAL | 0 refills | Status: AC | PRN

## 2024-04-27 MED ORDER — OXYCODONE HCL 5 MG PO TABS
5.0000 mg | ORAL_TABLET | Freq: Once | ORAL | Status: AC
  Administered 2024-04-27: 5 mg via ORAL
  Filled 2024-04-27: qty 1

## 2024-04-27 NOTE — ED Provider Notes (Signed)
 " [facility] Provider Note   CSN: [id number] Arrival date & time: 04/26/24  2234     History Chief Complaint  Patient presents with   Generalized Body Aches    HPI Tara Summers is a 73 y.o. female presenting for generalized muscle aches and pain. States she first noticed the pain after her grandson gave her a hug Very pleuritic pain.  Patient's recorded medical, surgical, social, medication list and allergies were reviewed in the Snapshot window as part of the initial history.   Review of Systems   Review of Systems  Constitutional:  Positive for fever. Negative for chills.  HENT:  Positive for congestion and sore throat. Negative for ear pain.   Eyes:  Negative for pain and visual disturbance.  Respiratory:  Positive for cough. Negative for choking and shortness of breath.   Cardiovascular:  Negative for chest pain and palpitations.  Gastrointestinal:  Negative for abdominal pain and vomiting.  Genitourinary:  Negative for dysuria and hematuria.  Musculoskeletal:  Negative for arthralgias and back pain.  Skin:  Negative for color change and rash.  Neurological:  Negative for seizures and syncope.  All other systems reviewed and are negative.   Physical Exam Updated Vital Signs BP (!) 187/89 (BP Location: Right Arm)   Pulse 91   Temp 99.7 F (37.6 C) (Oral)   Resp (!) 22   Ht 5' (1.524 m)   Wt 94.8 kg   SpO2 96%   BMI 40.82 kg/m  Physical Exam Vitals and nursing note reviewed.  Constitutional:      General: She is not in acute distress.    Appearance: She is well-developed.  HENT:     Head: Normocephalic and atraumatic.  Eyes:     Conjunctiva/sclera: Conjunctivae normal.  Cardiovascular:     Rate and Rhythm: Normal rate and regular rhythm.     Heart sounds: No murmur heard. Pulmonary:     Effort: Pulmonary effort is normal. No respiratory distress.     Breath sounds: Normal breath sounds.  Abdominal:      Palpations: Abdomen is soft.     Tenderness: There is no abdominal tenderness.  Musculoskeletal:        General: No swelling.     Cervical back: Neck supple.  Skin:    General: Skin is warm and dry.     Capillary Refill: Capillary refill takes less than 2 seconds.  Neurological:     Mental Status: She is alert.  Psychiatric:        Mood and Affect: Mood normal.      ED Course/ Medical Decision Making/ A&P    Procedures Procedures   Medications Ordered in ED Medications  acetaminophen  (TYLENOL ) tablet 650 mg (650 mg Oral Given 04/26/24 2345)    Medical Decision Making:   Tara Summers is a 73 y.o. female who presented to the ED today with diffuse muscle pains, fever cough congestion detailed above.    Patient placed on continuous vitals and telemetry monitoring while in ED which was reviewed periodically.  Complete initial physical exam performed, notably the patient  was hemodynamically stable in no acute distress.    Reviewed and confirmed nursing documentation for past medical history, family history, social history.    Initial Assessment:   This is most consistent with an acute life/limb threatening illness complicated by underlying chronic conditions. 73 year old female with fever is high risk for serious bacterial infection this include urinary pathology, pulmonary  pathology or skin pathology as well as bacteremia.  However, patient's presentation is very diffuse in nature and very benign otherwise.  She is having generalized bodyaches without a focal finding of cough shortness of breath, urinary symptoms.  She states her strength is otherwise fine and she is in no acute distress.  Favor likely viral syndrome with viral myositis causing the etiology.  It is relatively acute so viral screening is unlikely to be diagnostic this early on her presentation.  However will evaluate for serious bacterial illness as below Initial Plan:  Viral screening Screening labs including CBC  and Metabolic panel to evaluate for infectious or metabolic etiology of disease.  Urinalysis with reflex culture ordered to evaluate for UTI or relevant urologic/nephrologic pathology.  CXR to evaluate for structural/infectious intrathoracic pathology.  EKG/Troponin testing/BNP testing to evaluate for cardiac pathology. Objective evaluation as below reviewed   Initial Study Results:   Laboratory  All laboratory results reviewed without evidence of clinically relevant pathology.     EKG EKG was reviewed independently. Rate, rhythm, axis, intervals all examined and without medically relevant abnormality. ST segments without concerns for elevations.    Radiology:  All images reviewed independently. Agree with radiology report at this time.   DG Chest 2 View Result Date: 04/27/2024 EXAM: 2 VIEW(S) XRAY OF THE CHEST 04/26/2024 11:23:00 PM COMPARISON: CTA chest 04/19/2023. Comparison to the prior study reveals no significant interval change. CLINICAL HISTORY: cough, fever FINDINGS: LUNGS AND PLEURA: There are chronic interstitial changes of the lungs without evidence of focal pneumonia. No pleural effusion. No pneumothorax. HEART AND MEDIASTINUM: The heart is slightly enlarged. No vascular congestion is seen. The mediastinum is stable with aortic uncoiling and calcifications. BONES AND SOFT TISSUES: Thoracic spondylosis. There is a partially visible lower lumbar fusion construct. Cholecystectomy clips. No acute osseous abnormality. IMPRESSION: 1. No focal pneumonia or other acute cardiopulmonary findings. 2. Chronic interstitial lung changes. Chest with chronic change and slight cardiomegaly . Electronically signed by: Francis Quam MD 04/27/2024 12:21 AM EST RP Workstation: [id number]    Reassessment and Plan:   Screening for serious bacterial illness grossly negative.  No acute pathology on my workup.  Fever has improved with antipyretics and syndrome is completely resolved with patient resting  comfortably. Shared medical decision making with patient: We could keep the patient in observation status for fever of unknown origin or trial of ambulatory management strict return precautions.  Patient has elected for discharge with ambulatory follow-up with PCP with supportive care.  Likely developing viral syndrome, strict turn precautions reinforced.  Tested negative for the flu with no known flu positive contacts, will hold off on COVID or flu treatment given lack of confirmatory testing and still undifferentiated nature of symptoms. Strict return precautions followed up, recommended 48-hour evaluation with PCP.   Disposition:  I have considered need for hospitalization, however, considering all of the above, I believe this patient is stable for discharge at this time.  Patient/family educated about specific return precautions for given chief complaint and symptoms.  Patient/family educated about follow-up with PCP.     Patient/family expressed understanding of return precautions and need for follow-up. Patient spoken to regarding all imaging and laboratory results and appropriate follow up for these results. All education provided in verbal form with additional information in written form. Time was allowed for answering of patient questions. Patient discharged.    Emergency Department Medication Summary:   Medications  acetaminophen  (TYLENOL ) tablet 650 mg (650 mg Oral Given 04/26/24 2345)  ketorolac  (TORADOL ) 15 MG/ML injection 15 mg (15 mg Intravenous Given 04/27/24 0050)  lactated ringers  bolus 1,000 mL (1,000 mLs Intravenous New Bag/Given 04/27/24 0054)  oxyCODONE  (Oxy IR/ROXICODONE ) immediate release tablet 5 mg (5 mg Oral Given 04/27/24 0049)         Clinical Impression: No diagnosis found.   Data Unavailable   Final Clinical Impression(s) / ED Diagnoses Final diagnoses:  None    Rx / DC Orders ED Discharge Orders     None         Jerral Meth,  MD 04/27/24 [phone]  "

## 2024-04-29 ENCOUNTER — Encounter: Payer: Self-pay | Admitting: Neurology

## 2024-05-02 ENCOUNTER — Ambulatory Visit
Admission: RE | Admit: 2024-05-02 | Discharge: 2024-05-02 | Disposition: A | Discharge status: Home / Self Care | Source: Ambulatory Visit | Attending: Neurology | Admitting: Neurology

## 2024-05-02 DIAGNOSIS — M5412 Radiculopathy, cervical region: Secondary | ICD-10-CM

## 2024-05-02 DIAGNOSIS — G44219 Episodic tension-type headache, not intractable: Secondary | ICD-10-CM

## 2024-05-02 DIAGNOSIS — E1142 Type 2 diabetes mellitus with diabetic polyneuropathy: Secondary | ICD-10-CM

## 2024-05-15 ENCOUNTER — Ambulatory Visit: Payer: Self-pay | Admitting: Neurology
